# Patient Record
Sex: Male | Born: 1950
Health system: Southern US, Community
[De-identification: ages and names within clinical notes are randomized; demographics above are authoritative.]

## PROBLEM LIST (undated history)

## (undated) DIAGNOSIS — E119 Type 2 diabetes mellitus without complications: Secondary | ICD-10-CM

## (undated) DIAGNOSIS — R011 Cardiac murmur, unspecified: Secondary | ICD-10-CM

## (undated) DIAGNOSIS — R0789 Other chest pain: Secondary | ICD-10-CM

## (undated) DIAGNOSIS — E785 Hyperlipidemia, unspecified: Secondary | ICD-10-CM

## (undated) DIAGNOSIS — Z87442 Personal history of urinary calculi: Secondary | ICD-10-CM

## (undated) DIAGNOSIS — K5790 Diverticulosis of intestine, part unspecified, without perforation or abscess without bleeding: Secondary | ICD-10-CM

## (undated) HISTORY — DX: Hyperlipidemia, unspecified: E78.5

## (undated) HISTORY — DX: Type 2 diabetes mellitus without complications: E11.9

## (undated) HISTORY — DX: Cardiac murmur, unspecified: R01.1

## (undated) HISTORY — DX: Other chest pain: R07.89

## (undated) HISTORY — DX: Diverticulosis of intestine, part unspecified, without perforation or abscess without bleeding: K57.90

## (undated) HISTORY — PX: GANGLION CYST EXCISION: SHX1691

---
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
## 1987-12-29 DIAGNOSIS — R0789 Other chest pain: Secondary | ICD-10-CM

## 1987-12-29 HISTORY — DX: Other chest pain: R07.89

## 1999-08-11 ENCOUNTER — Emergency Department (HOSPITAL_COMMUNITY): Admission: EM | Admit: 1999-08-11 | Discharge: 1999-08-11 | Payer: Self-pay | Admitting: Internal Medicine

## 1999-08-11 ENCOUNTER — Encounter: Payer: Self-pay | Admitting: Internal Medicine

## 2006-05-19 ENCOUNTER — Ambulatory Visit: Payer: Self-pay | Admitting: Gastroenterology

## 2006-06-04 ENCOUNTER — Ambulatory Visit: Payer: Self-pay | Admitting: Gastroenterology

## 2008-11-27 ENCOUNTER — Ambulatory Visit: Payer: Self-pay | Admitting: Cardiology

## 2008-12-07 ENCOUNTER — Ambulatory Visit: Payer: Self-pay

## 2009-02-25 ENCOUNTER — Ambulatory Visit (HOSPITAL_COMMUNITY): Admission: RE | Admit: 2009-02-25 | Discharge: 2009-02-25 | Payer: Self-pay | Admitting: Family Medicine

## 2011-04-09 LAB — BUN: BUN: 9 mg/dL (ref 6–23)

## 2011-04-09 LAB — CREATININE, SERUM
Creatinine, Ser: 0.86 mg/dL (ref 0.4–1.5)
GFR calc Af Amer: >60 mL/min (ref >60)
GFR calc non Af Amer: >60 mL/min (ref >60)

## 2011-04-28 ENCOUNTER — Encounter: Payer: Self-pay | Admitting: Family Medicine

## 2011-05-12 NOTE — Assessment & Plan Note (Signed)
[facility]                            CARDIOLOGY OFFICE NOTE   COYLE, STORDAHL                       MRN:          [id number]  DATE:11/27/2008                            DOB:          05-13-1951    HISTORY OF PRESENT ILLNESS:  This is a 60 year old with a history of  hypercholesterolemia and type 2 diabetes, who presents to Cardiology  Clinic for evaluation of an equivocal exercise treadmill test and  atypical chest pain.  In general, the patient is quite active.  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  He does not get  shortness of breath with exertion and as I mentioned he does have a very  high level of exercise tolerance.  He states that occasionally he gets  some soreness in his chest when unloading heavy boxes out of trucks at  his store.  He thinks this maybe more musculoskeletal rather than  anything else.  However, given his age and the fact that he has diabetes  and high cholesterol, he has been somewhat worried about it.  He did  have an exercise treadmill test back in October 2009 at Dr. Kathi Der  office.  He did exercise for 9 minutes and 25 seconds reaching 10.5  METS.  There were nondiagnostic EKG changes.  However, he does have an  abnormal EKG at baseline with anteroseptal Q waves consistent with a  diagnosis of an old anteroseptal MI.  Again, the patient's chest  soreness does tend to be quite rare and only occurs with lifting heavy  objects.   PAST MEDICAL HISTORY:  1. Type 2 diabetes.  The patient has been on Diabeta for 2-1/2 years.  2. Hypercholesterolemia.  3. History of supraventricular tachycardia back in 1989.  The patient      is not sure what the final diagnosis was at this time.  He was seen      in [facility], however, I do not have records of this.   MEDICATIONS:  1. Januvia 100 mg daily.  2. Fish oil.  3.  Lipitor 40 mg daily.  4. Lisinopril 5 mg daily.  5. Aspirin 81 mg daily.   SOCIAL HISTORY:  The patient is a nonsmoker.  He does not drink alcohol.  He is married with children.  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   FAMILY HISTORY:  There is no apparent family history of premature  coronary artery disease.  The patient's mother did have type 2 diabetes.   REVIEW OF SYSTEMS:  Negative except as noted in history of present  illness.   LABORATORY DATA:  Most recent labs from November 2009, LDL is 56, HDL is  38, LDL particle number is 865, hs-CRP is 0.6 which is low, and  hemoglobin A1c is 6.4.  EKG today was reviewed showed normal sinus  rhythm with an incomplete right bundle-branch  block and evidence for an  old anterior septal MI.   PHYSICAL EXAMINATION:  VITAL SIGNS:  Blood pressure 123/71, heart rate  67 and regular, and weight is 176 pounds.  GENERAL:  This is a well-developed male in no apparent distress.  NEUROLOGIC:  Alert and oriented x3.  Normal affect.  NECK:  No JVD.  No thyromegaly or thyroid nodule.  HEENT:  Normal exam.  CARDIOVASCULAR:  Heart regular, S1 and S2.  There is normal  physiological splitting of S2.  No S3.  No S4.  No murmur.  There is no  carotid bruit.  EXTREMITIES:  There is no peripheral edema.  There are 2+ posterior  tibial pulses bilaterally.  ABDOMEN:  Soft and nontender.  No hepatosplenomegaly.  LUNGS:  Clear to auscultation bilaterally with normal respiratory  effort.  MUSCULOSKELETAL:  Normal exam.  SKIN:  Normal exam.   ASSESSMENT AND PLAN:  This is a 60 year old with history of type 2  diabetes and hypercholesterolemia, who presents for evaluation of chest  pain and an equivocal exercise treadmill test.  1. Chest pain.  The patient does have some soreness in his chest with      heavy lifting.  This happens rarely and at baseline, he is quite      active and does not get any chest  pain with any other activity.      This maybe due to musculoskeletal pain.  However, the patient did      have a nondiagnostic exercise treadmill test in October and he does      have questionable evidence for old anterior septal myocardial      infarction on his EKG.  Therefore, I do think it will be reasonable      to further pursue this, especially given his risk factors of      diabetes and hypercholesterolemia, with an exercise treadmill      Myoview.  We will go ahead and set this up for the next week.  He      should continue on his aspirin and his ACE inhibitor and statin as      he is doing.  2. Hyperlipidemia.  The patient's cholesterol is under excellent      control with a low LDL particle number and a low hs-CRP.  3. Type 2 diabetes.  The patient's diabetes appears to be under good      control with a hemoglobin A1c of 6.4.     Marca Ancona, MD  Electronically Signed    DM/MedQ  DD: 11/27/2008  DT: 11/28/2008  Job #: [id number]   cc:   Ernestina Penna, M.D.

## 2011-11-10 ENCOUNTER — Ambulatory Visit: Payer: Self-pay | Admitting: Physical Therapy

## 2011-12-15 ENCOUNTER — Ambulatory Visit: Payer: Self-pay | Admitting: Physical Therapy

## 2011-12-17 ENCOUNTER — Encounter: Payer: Self-pay | Admitting: Physical Therapy

## 2013-04-04 ENCOUNTER — Other Ambulatory Visit: Payer: Self-pay | Admitting: *Deleted

## 2013-04-04 MED ORDER — LISINOPRIL 5 MG PO TABS: 5.0000 mg | ORAL_TABLET | Freq: Every day | ORAL | Status: DC

## 2013-06-08 ENCOUNTER — Encounter: Payer: Self-pay | Admitting: *Deleted

## 2013-07-05 ENCOUNTER — Ambulatory Visit: Payer: Self-pay | Admitting: Family Medicine

## 2013-07-06 ENCOUNTER — Other Ambulatory Visit (INDEPENDENT_AMBULATORY_CARE_PROVIDER_SITE_OTHER): Payer: BC Managed Care – PPO

## 2013-07-06 ENCOUNTER — Other Ambulatory Visit: Payer: BC Managed Care – PPO

## 2013-07-06 DIAGNOSIS — I10 Essential (primary) hypertension: Secondary | ICD-10-CM

## 2013-07-06 DIAGNOSIS — E785 Hyperlipidemia, unspecified: Secondary | ICD-10-CM

## 2013-07-06 DIAGNOSIS — E119 Type 2 diabetes mellitus without complications: Secondary | ICD-10-CM

## 2013-07-06 DIAGNOSIS — E559 Vitamin D deficiency, unspecified: Secondary | ICD-10-CM

## 2013-07-06 DIAGNOSIS — R5381 Other malaise: Secondary | ICD-10-CM

## 2013-07-06 LAB — BASIC METABOLIC PANEL WITH GFR
BUN: 20 mg/dL (ref 6–23)
GFR, Est African American: >89 mL/min
GFR, Est Non African American: >89 mL/min
Potassium: 4.8 mEq/L (ref 3.5–5.3)
Sodium: 138 mEq/L (ref 135–145)

## 2013-07-06 LAB — POCT CBC
Granulocyte percent: 69.3 %G (ref 37–80)
Lymph, poc: 2.2 (ref 0.6–3.4)
MPV: 7.6 fL (ref 0–99.8)
POC Granulocyte: 6 (ref 2–6.9)
POC LYMPH PERCENT: 25.6 %L (ref 10–50)
Platelet Count, POC: 228 10*3/uL (ref 142–424)
RBC: 5 M/uL (ref 4.69–6.13)
RDW, POC: 13 %
WBC: 8.6 10*3/uL (ref 4.6–10.2)

## 2013-07-06 LAB — HEPATIC FUNCTION PANEL
Bilirubin, Direct: 0.2 mg/dL (ref 0.0–0.3)
Indirect Bilirubin: 0.5 mg/dL (ref 0.0–0.9)
Total Bilirubin: 0.7 mg/dL (ref 0.3–1.2)
Total Protein: 6.4 g/dL (ref 6.0–8.3)

## 2013-07-06 NOTE — Progress Notes (Signed)
Patient came in for labs only.

## 2013-07-07 LAB — NMR LIPOPROFILE WITH LIPIDS
HDL Particle Number: 28.8 umol/L — ABNORMAL LOW (ref >=30.5)
HDL-C: 36 mg/dL — ABNORMAL LOW (ref >=40)
LDL (calc): 40 mg/dL (ref <100)
LDL Size: 20.2 nm — ABNORMAL LOW (ref >20.5)
LP-IR Score: 49 — ABNORMAL HIGH (ref <=45)
Triglycerides: 78 mg/dL (ref <150)

## 2013-07-12 ENCOUNTER — Other Ambulatory Visit: Payer: Self-pay | Admitting: Family Medicine

## 2013-07-12 ENCOUNTER — Ambulatory Visit (INDEPENDENT_AMBULATORY_CARE_PROVIDER_SITE_OTHER): Payer: BC Managed Care – PPO | Admitting: Family Medicine

## 2013-07-12 ENCOUNTER — Encounter: Payer: Self-pay | Admitting: Family Medicine

## 2013-07-12 VITALS — BP 115/69 | HR 65 | Temp 98.8°F | Ht 71.0 in | Wt 183.6 lb

## 2013-07-12 DIAGNOSIS — E785 Hyperlipidemia, unspecified: Secondary | ICD-10-CM

## 2013-07-12 DIAGNOSIS — E119 Type 2 diabetes mellitus without complications: Secondary | ICD-10-CM | POA: Insufficient documentation

## 2013-07-12 DIAGNOSIS — J309 Allergic rhinitis, unspecified: Secondary | ICD-10-CM

## 2013-07-12 DIAGNOSIS — E1169 Type 2 diabetes mellitus with other specified complication: Secondary | ICD-10-CM | POA: Insufficient documentation

## 2013-07-12 DIAGNOSIS — N400 Enlarged prostate without lower urinary tract symptoms: Secondary | ICD-10-CM | POA: Insufficient documentation

## 2013-07-12 DIAGNOSIS — K4090 Unilateral inguinal hernia, without obstruction or gangrene, not specified as recurrent: Secondary | ICD-10-CM

## 2013-07-12 NOTE — Patient Instructions (Addendum)
Fall precautions discussed Continue current meds and therapeutic lifestyle changes 

## 2013-07-12 NOTE — Progress Notes (Signed)
  Subjective:    Patient ID: Peter Mathis, male    DOB: 20-Nov-1951, 62 y.o.   MRN: [id number]  HPI Patient returns today for followup and management of chronic medical conditions. These include type 2 diabetes mellitus and hyperlipidemia. Patient had a recent positive FOBT.  Review of Systems  Constitutional: Positive for fatigue (slight).  HENT: Positive for postnasal drip (slight, due to allergies). Negative for ear pain and sore throat.   Eyes: Negative.   Respiratory: Negative.   Cardiovascular: Negative.   Gastrointestinal: Negative.   Endocrine: Negative.   Genitourinary: Negative.   Musculoskeletal: Negative.   Skin: Negative.   Allergic/Immunologic: Positive for environmental allergies (seasonal).  Neurological: Positive for headaches (with allergy symptoms).  Hematological: Negative.   Psychiatric/Behavioral: Negative.        Objective:   Physical Exam BP 115/69  Pulse 65  Temp(Src) 98.8 F (37.1 C) (Oral)  Ht 5\' 11"  (1.803 m)  Wt 183 lb 9.6 oz (83.28 kg)  BMI 25.62 kg/m2  The patient appeared well nourished and normally developed, alert and oriented to time and place. Speech, behavior and judgement appear normal. Vital signs as documented.  Head exam is unremarkable. No scleral icterus or pallor noted. There is nasal congestion bilaterally. Mouth and throat were normal Neck is without jugular venous distension, thyromegally, or carotid bruits. Carotid upstrokes are brisk bilaterally. No cervical adenopathy. Lungs are clear anteriorly and posteriorly to auscultation. Normal respiratory effort. Cardiac exam reveals regular rate and rhythm at 72 per minute. First and second heart sounds normal.  No murmurs, rubs or gallops.  Abdominal exam reveals normal bowl sounds, no bruit, no masses, no organomegaly and no aortic enlargement. No inguinal adenopathy. Rectal exam revealed no rectal masses. Prostate was slightly enlarged and smooth without lumps. External genitalia  were normal. There was a small palpable hernia on the left. Extremities are nonedematous and both femoral and pedal pulses are normal. Skin without pallor or jaundice.  Warm and dry, without rash. Neurologic exam reveals normal deep tendon reflexes and normal sensation. Diabetic foot exam was done          Assessment & Plan:  1. Hyperlipemia  2. Type II diabetes mellitus  3. BPH (benign prostatic hyperplasia) -PSA will be drawn today  4. Allergic rhinitis  5. Inguinal hernia  Patient Instructions  Fall precautions discussed Continue current meds and therapeutic lifestyle changes   Bring back FOBT  Nyra Capes MD

## 2013-07-25 ENCOUNTER — Other Ambulatory Visit: Payer: BC Managed Care – PPO

## 2013-07-25 DIAGNOSIS — Z1212 Encounter for screening for malignant neoplasm of rectum: Secondary | ICD-10-CM

## 2013-07-26 LAB — FECAL OCCULT BLOOD, IMMUNOCHEMICAL: Fecal Occult Blood: NEGATIVE

## 2013-07-29 ENCOUNTER — Encounter: Payer: Self-pay | Admitting: Family Medicine

## 2013-08-02 ENCOUNTER — Other Ambulatory Visit: Payer: Self-pay

## 2013-08-03 ENCOUNTER — Encounter: Payer: Self-pay | Admitting: *Deleted

## 2013-10-04 ENCOUNTER — Other Ambulatory Visit: Payer: Self-pay | Admitting: Family Medicine

## 2013-11-02 ENCOUNTER — Other Ambulatory Visit: Payer: Self-pay

## 2014-01-06 ENCOUNTER — Other Ambulatory Visit: Payer: Self-pay | Admitting: Family Medicine

## 2014-01-07 ENCOUNTER — Other Ambulatory Visit: Payer: Self-pay | Admitting: Family Medicine

## 2014-01-09 NOTE — Telephone Encounter (Signed)
Last seen 07/12/13  DWM  Patient wants a 61 day supply

## 2014-01-17 ENCOUNTER — Ambulatory Visit (INDEPENDENT_AMBULATORY_CARE_PROVIDER_SITE_OTHER): Payer: BC Managed Care – PPO

## 2014-01-17 ENCOUNTER — Other Ambulatory Visit: Payer: BC Managed Care – PPO

## 2014-01-17 ENCOUNTER — Encounter: Payer: Self-pay | Admitting: Family Medicine

## 2014-01-17 ENCOUNTER — Ambulatory Visit (INDEPENDENT_AMBULATORY_CARE_PROVIDER_SITE_OTHER): Payer: BC Managed Care – PPO | Admitting: Family Medicine

## 2014-01-17 VITALS — BP 134/79 | HR 69 | Temp 98.1°F | Ht 71.0 in | Wt 181.0 lb

## 2014-01-17 DIAGNOSIS — E1165 Type 2 diabetes mellitus with hyperglycemia: Principal | ICD-10-CM

## 2014-01-17 DIAGNOSIS — N400 Enlarged prostate without lower urinary tract symptoms: Secondary | ICD-10-CM

## 2014-01-17 DIAGNOSIS — R5381 Other malaise: Secondary | ICD-10-CM

## 2014-01-17 DIAGNOSIS — E119 Type 2 diabetes mellitus without complications: Secondary | ICD-10-CM

## 2014-01-17 DIAGNOSIS — E785 Hyperlipidemia, unspecified: Secondary | ICD-10-CM

## 2014-01-17 DIAGNOSIS — IMO0001 Reserved for inherently not codable concepts without codable children: Secondary | ICD-10-CM

## 2014-01-17 DIAGNOSIS — R5383 Other fatigue: Secondary | ICD-10-CM

## 2014-01-17 DIAGNOSIS — I4510 Unspecified right bundle-branch block: Secondary | ICD-10-CM

## 2014-01-17 DIAGNOSIS — Z0000 Encounter for general adult medical examination without abnormal findings: Secondary | ICD-10-CM

## 2014-01-17 DIAGNOSIS — J309 Allergic rhinitis, unspecified: Secondary | ICD-10-CM

## 2014-01-17 DIAGNOSIS — I10 Essential (primary) hypertension: Secondary | ICD-10-CM

## 2014-01-17 LAB — POCT CBC
Granulocyte percent: 70.8 %G (ref 37–80)
HEMATOCRIT: 46.2 % (ref 43.5–53.7)
Hemoglobin: 14.8 g/dL (ref 14.1–18.1)
Lymph, poc: 2.4 (ref 0.6–3.4)
MCH, POC: 28.4 pg (ref 27–31.2)
MCHC: 32 g/dL (ref 31.8–35.4)
MCV: 88.6 fL (ref 80–97)
MPV: 6.9 fL (ref 0–99.8)
POC Granulocyte: 6.4 (ref 2–6.9)
POC LYMPH PERCENT: 26.5 %L (ref 10–50)
Platelet Count, POC: 252 10*3/uL (ref 142–424)
RBC: 5.2 M/uL (ref 4.69–6.13)
RDW, POC: 12.6 %
WBC: 9 10*3/uL (ref 4.6–10.2)

## 2014-01-17 LAB — POCT URINALYSIS DIPSTICK
Bilirubin, UA: NEGATIVE
GLUCOSE UA: NEGATIVE
Leukocytes, UA: NEGATIVE
Nitrite, UA: NEGATIVE
Protein, UA: NEGATIVE
RBC UA: NEGATIVE
SPEC GRAV UA: 1.025
UROBILINOGEN UA: NEGATIVE
pH, UA: 6

## 2014-01-17 LAB — POCT UA - MICROSCOPIC ONLY
CASTS, UR, LPF, POC: NEGATIVE
Crystals, Ur, HPF, POC: NEGATIVE
MUCUS UA: NEGATIVE
Yeast, UA: NEGATIVE

## 2014-01-17 LAB — POCT GLYCOSYLATED HEMOGLOBIN (HGB A1C): Hemoglobin A1C: 7.2

## 2014-01-17 LAB — POCT UA - MICROALBUMIN: Microalbumin Ur, POC: NEGATIVE mg/L

## 2014-01-17 MED ORDER — AZELASTINE HCL 0.1 % NA SOLN: NASAL | Status: DC

## 2014-01-17 NOTE — Progress Notes (Signed)
Subjective:    Patient ID: Peter Mathis, male    DOB: [birth year], 63 y.o.   MRN: [id number]  HPI Pt here for follow up and management of chronic medical problems.        Patient Active Problem List   Diagnosis Date Noted  . Hyperlipemia 07/12/2013  . Type II diabetes mellitus 07/12/2013  . BPH (benign prostatic hyperplasia) 07/12/2013   Outpatient Encounter Prescriptions as of 01/17/2014  Medication Sig  . aspirin (ASPIRIN LOW DOSE) 81 MG EC tablet Take 81 mg by mouth daily.    [name] atorvastatin (LIPITOR) 40 MG tablet Take 40 mg by mouth daily.    . Cholecalciferol (VITAMIN D3) 5000 UNITS CAPS Take by mouth.    [name] JANUVIA 100 MG tablet TAKE 1 TABLET BY MOUTH DAILY  . lisinopril (PRINIVIL,ZESTRIL) 5 MG tablet TAKE 1 TABLET (5 MG TOTAL) BY MOUTH DAILY.  . niacin (NIASPAN) 1000 MG CR tablet TAKE 1 TABLET BY MOUTH EVERY DAY  . omega-3 acid ethyl esters (LOVAZA) 1 G capsule Take 2 g by mouth daily.  . [DISCONTINUED] niacin (NIASPAN) 500 MG CR tablet Take 500 mg by mouth daily.      Review of Systems  Constitutional: Negative.   HENT: Positive for congestion (head congestions) and postnasal drip.   Eyes: Negative.   Respiratory: Negative.   Cardiovascular: Negative.   Gastrointestinal: Negative.   Endocrine: Negative.   Genitourinary: Negative.   Musculoskeletal: Negative.   Skin: Negative.   Allergic/Immunologic: Negative.   Neurological: Negative.   Hematological: Negative.   Psychiatric/Behavioral: Negative.        Objective:   Physical Exam  Nursing note and vitals reviewed. Constitutional: He is oriented to person, place, and time. He appears well-developed and well-nourished. No distress.  HENT:  Head: Normocephalic and atraumatic.  Right Ear: External ear normal.  Left Ear: External ear normal.  Mouth/Throat: Oropharynx is clear and moist. No oropharyngeal exudate.  Nasal congestion and turbinate swelling bilaterally  Eyes: Conjunctivae and EOM are normal.  Pupils are equal, round, and reactive to light. Right eye exhibits no discharge. Left eye exhibits no discharge. No scleral icterus.  Neck: Normal range of motion. Neck supple. No tracheal deviation present. No thyromegaly present.  No carotid bruits  Cardiovascular: Normal rate, regular rhythm, normal heart sounds and intact distal pulses.  Exam reveals no gallop and no friction rub.   No murmur heard. At 72 per minute  Pulmonary/Chest: Effort normal and breath sounds normal. No respiratory distress. He has no wheezes. He has no rales. He exhibits no tenderness.  Abdominal: Soft. Bowel sounds are normal. He exhibits no mass. There is no tenderness. There is no rebound and no guarding.  Musculoskeletal: Normal range of motion. He exhibits no edema and no tenderness.  Lymphadenopathy:    He has no cervical adenopathy.  Neurological: He is alert and oriented to person, place, and time. He has normal reflexes. No cranial nerve deficit.  Skin: Skin is warm and dry. No rash noted. No erythema. No pallor.  Psychiatric: He has a normal mood and affect. His behavior is normal. Judgment and thought content normal.   BP 134/79  Pulse 69  Temp(Src) 98.1 F (36.7 C) (Oral)  Ht 5\' 11"  (1.803 m)  Wt 181 lb (82.101 kg)  BMI 25.26 kg/m2  [facility] reading (PRIMARY) by  DrMoore;-chest x-ray;   --no active disease  Assessment & Plan:  1. BPH (benign prostatic hyperplasia)  2. Hyperlipemia - DG Chest 2 View; Future  3. Type II diabetes mellitus - DG Chest 2 View; Future  4. Health care maintenance - DG Chest 2 View; Future  5. Right bundle branch block  6. Allergic rhinitis - azelastine (ASTELIN) 137 MCG/SPRAY nasal spray; 2 sprays each nostril at bedtime  Dispense: 30 mL; Refill: 12  Patient Instructions  Continue current medications. Continue good therapeutic lifestyle changes which include good diet and exercise. Fall precautions discussed with  patient. Schedule your flu vaccine if you haven't had it yet If you are over 66 years old - you may need Prevnar 80 or the adult Pneumonia vaccine. Use a cool mist humidifier in her bedroom at nighttime Use the additional nasal spray as directed Use saline nose spray over-the-counter throughout the day Keep yourself well hydrated Check with your insurance regarding the Prevnar vaccine    Arrie Senate MD

## 2014-01-17 NOTE — Patient Instructions (Addendum)
Continue current medications. Continue good therapeutic lifestyle changes which include good diet and exercise. Fall precautions discussed with patient. Schedule your flu vaccine if you haven't had it yet If you are over 64 years old - you may need Prevnar 33 or the adult Pneumonia vaccine. Use a cool mist humidifier in her bedroom at nighttime Use the additional nasal spray as directed Use saline nose spray over-the-counter throughout the day Keep yourself well hydrated Check with your insurance regarding the Prevnar vaccine

## 2014-01-18 LAB — BMP8+EGFR
BUN/Creatinine Ratio: 22 (ref 10–22)
BUN: 19 mg/dL (ref 8–27)
CO2: 25 mmol/L (ref 18–29)
Calcium: 9 mg/dL (ref 8.6–10.2)
Chloride: 99 mmol/L (ref 97–108)
Creatinine, Ser: 0.86 mg/dL (ref 0.76–1.27)
GFR, EST AFRICAN AMERICAN: 107 mL/min/{1.73_m2} (ref >59)
GFR, EST NON AFRICAN AMERICAN: 92 mL/min/{1.73_m2} (ref >59)
Glucose: 164 mg/dL — ABNORMAL HIGH (ref 65–99)
Potassium: 4.6 mmol/L (ref 3.5–5.2)
Sodium: 139 mmol/L (ref 134–144)

## 2014-01-18 LAB — NMR, LIPOPROFILE
CHOLESTEROL: 89 mg/dL (ref <200)
HDL Cholesterol by NMR: 36 mg/dL — ABNORMAL LOW (ref >=40)
HDL PARTICLE NUMBER: 28.2 umol/L — AB (ref >=30.5)
LDL Particle Number: 678 nmol/L (ref <1000)
LDL Size: 19.9 nm — ABNORMAL LOW (ref >20.5)
LDLC SERPL CALC-MCNC: 40 mg/dL (ref <100)
LP-IR Score: 45 (ref <=45)
SMALL LDL PARTICLE NUMBER: 460 nmol/L (ref <=527)
TRIGLYCERIDES BY NMR: 67 mg/dL (ref <150)

## 2014-01-18 LAB — HEPATIC FUNCTION PANEL
ALT: 24 IU/L (ref 0–44)
AST: 18 IU/L (ref 0–40)
Albumin: 4.1 g/dL (ref 3.6–4.8)
Alkaline Phosphatase: 62 IU/L (ref 39–117)
BILIRUBIN DIRECT: 0.3 mg/dL (ref 0.00–0.40)
TOTAL PROTEIN: 6.8 g/dL (ref 6.0–8.5)
Total Bilirubin: 1.1 mg/dL (ref 0.0–1.2)

## 2014-01-18 LAB — VITAMIN D 25 HYDROXY (VIT D DEFICIENCY, FRACTURES): Vit D, 25-Hydroxy: 35.7 ng/mL (ref 30.0–100.0)

## 2014-01-23 ENCOUNTER — Telehealth: Payer: Self-pay | Admitting: *Deleted

## 2014-01-23 NOTE — Telephone Encounter (Signed)
Done

## 2014-02-17 ENCOUNTER — Other Ambulatory Visit: Payer: Self-pay | Admitting: Family Medicine

## 2014-02-22 NOTE — Telephone Encounter (Signed)
Per previous labs pt said he is already taking vit d 5000u daily? Do you still want him to increase this? See last labs.

## 2014-02-24 NOTE — Telephone Encounter (Signed)
Have patient take 5000 daily except 10,000 on Sunday of vitamin D.

## 2014-02-25 ENCOUNTER — Other Ambulatory Visit: Payer: Self-pay | Admitting: Family Medicine

## 2014-03-05 ENCOUNTER — Other Ambulatory Visit: Payer: Self-pay | Admitting: Family Medicine

## 2014-03-06 ENCOUNTER — Telehealth: Payer: Self-pay | Admitting: Family Medicine

## 2014-03-07 MED ORDER — GLUCOSE BLOOD VI STRP: ORAL_STRIP | Status: DC

## 2014-03-07 NOTE — Telephone Encounter (Signed)
done

## 2014-03-23 ENCOUNTER — Other Ambulatory Visit: Payer: Self-pay | Admitting: Family Medicine

## 2014-04-07 ENCOUNTER — Other Ambulatory Visit: Payer: Self-pay | Admitting: Family Medicine

## 2014-06-09 ENCOUNTER — Other Ambulatory Visit: Payer: Self-pay | Admitting: Family Medicine

## 2014-06-11 NOTE — Telephone Encounter (Signed)
Last lipomed 1/15

## 2014-06-12 ENCOUNTER — Other Ambulatory Visit: Payer: Self-pay | Admitting: Family Medicine

## 2014-07-09 ENCOUNTER — Ambulatory Visit (INDEPENDENT_AMBULATORY_CARE_PROVIDER_SITE_OTHER): Payer: BC Managed Care – PPO | Admitting: Nurse Practitioner

## 2014-07-09 VITALS — BP 120/70 | HR 71 | Temp 98.1°F | Ht 71.0 in | Wt 180.4 lb

## 2014-07-09 DIAGNOSIS — S30860A Insect bite (nonvenomous) of lower back and pelvis, initial encounter: Secondary | ICD-10-CM

## 2014-07-09 DIAGNOSIS — S30861A Insect bite (nonvenomous) of abdominal wall, initial encounter: Secondary | ICD-10-CM

## 2014-07-09 DIAGNOSIS — W57XXXA Bitten or stung by nonvenomous insect and other nonvenomous arthropods, initial encounter: Principal | ICD-10-CM

## 2014-07-09 MED ORDER — DOXYCYCLINE HYCLATE 100 MG PO TABS: 100.0000 mg | ORAL_TABLET | Freq: Two times a day (BID) | ORAL | Status: DC

## 2014-07-09 NOTE — Progress Notes (Signed)
   Subjective:    Patient ID: Peter Mathis, male    DOB: 05/14/1951, 63 y.o.   MRN: [id number]  HPI Patient in c/o neck stiffness- slight pain- radiating down back- sometimes his anterior shins will hurt- He said this started over  2 months ago- Michela Pitcher that he had a tick removed from his groin area mid April and he wonders if he has lymes disease.     Review of Systems  Constitutional: Positive for fatigue (big change for him). Negative for fever, chills and unexpected weight change.  HENT: Negative.   Respiratory: Negative.   Cardiovascular: Negative.   Genitourinary: Negative.   Neurological: Negative.   Psychiatric/Behavioral: Negative.   All other systems reviewed and are negative.      Objective:   Physical Exam  Constitutional: He is oriented to person, place, and time. He appears well-developed and well-nourished.  HENT:  Head: Normocephalic.  Right Ear: External ear normal.  Left Ear: External ear normal.  Nose: Nose normal.  Mouth/Throat: Oropharynx is clear and moist.  Eyes: Pupils are equal, round, and reactive to light.  Neck: Normal range of motion. Neck supple.  Cardiovascular: Normal rate, regular rhythm and normal heart sounds.   Pulmonary/Chest: Effort normal and breath sounds normal.  Abdominal: Soft. Bowel sounds are normal.  Musculoskeletal: Normal range of motion. He exhibits tenderness (along cervical spine on palpation.).  Lymphadenopathy:    He has no cervical adenopathy.  Neurological: He is alert and oriented to person, place, and time. He has normal reflexes. No cranial nerve deficit.  Skin: Skin is warm and dry.  Psychiatric: He has a normal mood and affect. His behavior is normal. Judgment and thought content normal.   BP 120/70  Pulse 71  Temp(Src) 98.1 F (36.7 C) (Oral)  Ht 5\' 11"  (1.803 m)  Wt 180 lb 6.4 oz (81.829 kg)  BMI 25.17 kg/m2        Assessment & Plan:  Tick bite of groin, initial encounter - Plan: Lyme Ab/Western Blot  Reflex, [place] spotted fvr abs pnl(IgG+IgM)  Meds ordered this encounter  Medications  . doxycycline (VIBRA-TABS) 100 MG tablet    Sig: Take 1 tablet (100 mg total) by mouth 2 (two) times daily.    Dispense:  28 tablet    Refill:  0    Order Specific Question:  Supervising Provider    Answer:  Chipper Herb [[id number]]   [facility] fluids Labs pending  Mary-Margaret Hassell Done, FNP

## 2014-07-09 NOTE — Patient Instructions (Signed)
Lyme Disease You may have been bitten by a tick and are to watch for the development of Lyme Disease. Lyme Disease is an infection that is caused by a bacteria The bacteria causing this disease is named Borreilia burgdorferi. If a tick is infected with this bacteria and then bites you, then Lyme Disease may occur. These ticks are carried by deer and rodents such as rabbits and mice and infest grassy as well as forested areas. Fortunately most tick bites do not cause Lyme Disease.  Lyme Disease is easier to prevent than to treat. First, covering your legs with clothing when walking in areas where ticks are possibly abundant will prevent their attachment because ticks tend to stay within inches of the ground. Second, using insecticides containing DEET can be applied on skin or clothing. Last, because it takes about 12 to 24 hours for the tick to transmit the disease after attachment to the human host, you should inspect your body for ticks twice a day when you are in areas where Lyme Disease is common. You must look thoroughly when searching for ticks. The Ixodes tick that carries Lyme Disease is very small. It is around the size of a sesame seed (picture of tick is not actual size). Removal is best done by grasping the tick by the head and pulling it out. Do not to squeeze the body of the tick. This could inject the infecting bacteria into the bite site. Wash the area of the bite with an antiseptic solution after removal.  Lyme Disease is a disease that may affect many body systems. Because of the small size of the biting tick, most people do not notice being bitten. The first sign of an infection is usually a round red rash that extends out from the center of the tick bite. The center of the lesion may be blood colored (hemorrhagic) or have tiny blisters (vesicular). Most lesions have bright red outer borders and partial central clearing. This rash may extend out many inches in diameter, and multiple lesions may  be present. Other symptoms such as fatigue, headaches, chills and fever, general achiness and swelling of lymph glands may also occur. If this first stage of the disease is left untreated, these symptoms may gradually resolve by themselves, or progressive symptoms may occur because of spread of infection to other areas of the body.  Follow up with your caregiver to have testing and treatment if you have a tick bite and you develop any of the above complaints. Your caregiver may recommend preventative (prophylactic) medications which kill bacteria (antibiotics). Once a diagnosis of Lyme Disease is made, antibiotic treatment is highly likely to cure the disease. Effective treatment of late stage Lyme Disease may require longer courses of antibiotic therapy.  MAKE SURE YOU:   Understand these instructions.  Will watch your condition.  Will get help right away if you are not doing well or get worse. Document Released: 03/22/2001 Document Revised: 03/07/2012 Document Reviewed: 05/24/2009 Exit[employer] Patient Information 2015 [employer]. This information is not intended to replace advice given to you by your health care provider. Make sure you discuss any questions you have with your health care provider.  

## 2014-07-11 LAB — ROCKY MTN SPOTTED FVR ABS PNL(IGG+IGM)
RMSF IGM: 0.39 {index} (ref 0.00–0.89)
RMSF IgG: UNDETERMINED

## 2014-07-11 LAB — LYME AB/WESTERN BLOT REFLEX: LYME DISEASE AB, QUANT, IGM: <0.8 index (ref 0.00–0.79)

## 2014-07-11 LAB — RMSF, IGG, IFA: RMSF, IGG, IFA: 1:64 {titer} — ABNORMAL HIGH

## 2014-07-12 ENCOUNTER — Other Ambulatory Visit: Payer: Self-pay | Admitting: Family Medicine

## 2014-07-13 ENCOUNTER — Telehealth: Payer: Self-pay | Admitting: Family Medicine

## 2014-07-13 NOTE — Telephone Encounter (Signed)
Message copied by Waverly Ferrari on Fri Jul 13, 2014  9:20 AM ------      Message from: Chevis Pretty      Created: Fri Jul 13, 2014  9:08 AM       Positive for RMSF- doxy should take care of it- make sure you take for the entire 14 days      Lyme titer negative ------

## 2014-07-13 NOTE — Telephone Encounter (Signed)
Pt aware of lab results 

## 2014-07-17 ENCOUNTER — Encounter: Payer: Self-pay | Admitting: *Deleted

## 2014-07-19 ENCOUNTER — Encounter: Payer: Self-pay | Admitting: *Deleted

## 2014-07-24 ENCOUNTER — Telehealth: Payer: Self-pay | Admitting: Nurse Practitioner

## 2014-07-24 ENCOUNTER — Other Ambulatory Visit: Payer: Self-pay | Admitting: Nurse Practitioner

## 2014-07-25 ENCOUNTER — Telehealth: Payer: Self-pay | Admitting: Nurse Practitioner

## 2014-07-25 MED ORDER — DOXYCYCLINE HYCLATE 100 MG PO TABS: 100.0000 mg | ORAL_TABLET | Freq: Two times a day (BID) | ORAL | Status: DC

## 2014-07-25 NOTE — Telephone Encounter (Signed)
Already addressed

## 2014-07-25 NOTE — Telephone Encounter (Signed)
Patient seen you for RMSF--says he is feeling some better

## 2014-07-25 NOTE — Telephone Encounter (Signed)
Pt aware and verbalized understanding.  

## 2014-07-25 NOTE — Telephone Encounter (Signed)
Please advise 

## 2014-07-25 NOTE — Telephone Encounter (Signed)
Will do another round of doxy call back if no better

## 2014-07-31 ENCOUNTER — Encounter: Payer: Self-pay | Admitting: Family Medicine

## 2014-07-31 ENCOUNTER — Ambulatory Visit (INDEPENDENT_AMBULATORY_CARE_PROVIDER_SITE_OTHER): Payer: BC Managed Care – PPO | Admitting: Family Medicine

## 2014-07-31 VITALS — BP 136/78 | HR 88 | Temp 98.0°F | Ht 71.0 in | Wt 177.0 lb

## 2014-07-31 DIAGNOSIS — E785 Hyperlipidemia, unspecified: Secondary | ICD-10-CM

## 2014-07-31 DIAGNOSIS — Z0000 Encounter for general adult medical examination without abnormal findings: Secondary | ICD-10-CM

## 2014-07-31 DIAGNOSIS — I491 Atrial premature depolarization: Secondary | ICD-10-CM

## 2014-07-31 DIAGNOSIS — E559 Vitamin D deficiency, unspecified: Secondary | ICD-10-CM

## 2014-07-31 DIAGNOSIS — E119 Type 2 diabetes mellitus without complications: Secondary | ICD-10-CM

## 2014-07-31 DIAGNOSIS — N400 Enlarged prostate without lower urinary tract symptoms: Secondary | ICD-10-CM

## 2014-07-31 DIAGNOSIS — A770 Spotted fever due to Rickettsia rickettsii: Secondary | ICD-10-CM

## 2014-07-31 DIAGNOSIS — A779 Spotted fever, unspecified: Secondary | ICD-10-CM

## 2014-07-31 LAB — POCT CBC
GRANULOCYTE PERCENT: 72.5 % (ref 37–80)
HCT, POC: 46.2 % (ref 43.5–53.7)
HEMOGLOBIN: 15.2 g/dL (ref 14.1–18.1)
Lymph, poc: 2.6 (ref 0.6–3.4)
MCH, POC: 29.1 pg (ref 27–31.2)
MCHC: 33 g/dL (ref 31.8–35.4)
MCV: 88.3 fL (ref 80–97)
MPV: 7.2 fL (ref 0–99.8)
PLATELET COUNT, POC: 226 10*3/uL (ref 142–424)
POC GRANULOCYTE: 7.2 — AB (ref 2–6.9)
POC LYMPH %: 26.4 % (ref 10–50)
RBC: 5.2 M/uL (ref 4.69–6.13)
RDW, POC: 12.7 %
WBC: 9.9 10*3/uL (ref 4.6–10.2)

## 2014-07-31 LAB — POCT UA - MICROSCOPIC ONLY
CASTS, UR, LPF, POC: NEGATIVE
Crystals, Ur, HPF, POC: NEGATIVE
YEAST UA: NEGATIVE

## 2014-07-31 LAB — POCT URINALYSIS DIPSTICK
Bilirubin, UA: NEGATIVE
GLUCOSE UA: NEGATIVE
Ketones, UA: NEGATIVE
NITRITE UA: NEGATIVE
Spec Grav, UA: 1.025
UROBILINOGEN UA: NEGATIVE
pH, UA: 6

## 2014-07-31 LAB — POCT UA - MICROALBUMIN: MICROALBUMIN (UR) POC: 50 mg/L

## 2014-07-31 LAB — POCT GLYCOSYLATED HEMOGLOBIN (HGB A1C): HEMOGLOBIN A1C: 7.5

## 2014-07-31 NOTE — Patient Instructions (Addendum)
Continue current medications. Continue good therapeutic lifestyle changes which include good diet and exercise. Fall precautions discussed with patient. If an FOBT was given today- please return it to our front desk. If you are over 64 years old - you may need Prevnar 80 or the adult Pneumonia vaccine.  Continue to take 2 additional weeks of antibiotics Return the FOBT Continue to monitor blood pressures and blood sugars at time We will call you with the results of the lab work is and is as results are available Please check with your insurance regarding the Prevnar vaccine Watch sodium intake Continue with fluticasone and Astelin nose sprays Avoid over-the-counter antihistamines with pseudoephedrine

## 2014-07-31 NOTE — Progress Notes (Signed)
 Subjective:    Patient ID: Peter Mathis, male    DOB: 11/22/1951, 63 y.o.   MRN: [id number]  HPI Patient is here today for annual wellness exam and follow up of chronic medical problems. The patient recently had a tick bite and this was found to be positive for [place] fever for which he is taking doxycycline. The tic bite was actually in April. He did not start the treatment and to a couple weeks ago. He is feeling better from this. He is seen today to return and FOBT and get lab work. He is also due for a prostate and PSA. Blood pressures have been running in the 120s over the 70s.        Patient Active Problem List   Diagnosis Date Noted  . Right bundle branch block, history of 01/17/2014  . Hyperlipemia 07/12/2013  . Type II diabetes mellitus 07/12/2013  . BPH (benign prostatic hyperplasia) 07/12/2013   Outpatient Encounter Prescriptions as of 07/31/2014  Medication Sig  . aspirin (ASPIRIN LOW DOSE) 81 MG EC tablet Take 81 mg by mouth daily.    . atorvastatin (LIPITOR) 80 MG tablet TAKE 1 TABLET AT BEDTIME  . azelastine (ASTELIN) 137 MCG/SPRAY nasal spray 2 sprays each nostril at bedtime  . Cholecalciferol (VITAMIN D3) 5000 UNITS CAPS Take by mouth.    . doxycycline (VIBRA-TABS) 100 MG tablet Take 1 tablet (100 mg total) by mouth 2 (two) times daily.  . glucose blood (ONE TOUCH ULTRA TEST) test strip Test bid, Dx 250.00  . JANUVIA 100 MG tablet TAKE 1 TABLET BY MOUTH DAILY  . lisinopril (PRINIVIL,ZESTRIL) 5 MG tablet TAKE 1 TABLET (5 MG TOTAL) BY MOUTH DAILY.  . niacin (NIASPAN) 1000 MG CR tablet TAKE 1 TABLET BY MOUTH EVERY DAY  . omega-3 acid ethyl esters (LOVAZA) 1 G capsule TAKE 2 CAPSULES TWICE DAILY    Review of Systems  Constitutional: Negative.   HENT: Negative.   Eyes: Negative.   Respiratory: Negative.   Cardiovascular: Negative.   Gastrointestinal: Negative.   Endocrine: Negative.   Genitourinary: Negative.   Musculoskeletal: Positive for  arthralgias, myalgias (positive RMSF) and neck pain.  Skin: Negative.   Allergic/Immunologic: Negative.   Neurological: Negative.   Hematological: Negative.   Psychiatric/Behavioral: Negative.        Objective:   Physical Exam  Nursing note and vitals reviewed. Constitutional: He is oriented to person, place, and time. He appears well-developed and well-nourished. No distress.  HENT:  Head: Normocephalic and atraumatic.  Right Ear: External ear normal.  Left Ear: External ear normal.  Mouth/Throat: Oropharynx is clear and moist. No oropharyngeal exudate.  Nasal congestion bilateral  Eyes: Conjunctivae and EOM are normal. Pupils are equal, round, and reactive to light. Right eye exhibits no discharge. Left eye exhibits no discharge. No scleral icterus.  Neck: Normal range of motion. Neck supple. No thyromegaly present.  Cardiovascular: Normal rate, normal heart sounds and intact distal pulses.  Exam reveals no gallop and no friction rub.   No murmur heard. The heart was slightly irregular at 72 per minute  Pulmonary/Chest: Effort normal and breath sounds normal. No respiratory distress. He has no wheezes. He has no rales. He exhibits no tenderness.  Abdominal: Soft. Bowel sounds are normal. He exhibits no mass. There is no tenderness. There is no rebound and no guarding.  Genitourinary: Rectum normal and penis normal. No penile tenderness.  Minimal prostate enlargement. There were no inguinal nodes or inguinal hernias. External genitalia   were normal. Rectal exam was without masses. Prostate had no lumps. The external genitalia were within normal limits   Musculoskeletal: Normal range of motion. He exhibits no edema and no tenderness.  Lymphadenopathy:    He has no cervical adenopathy.  Neurological: He is alert and oriented to person, place, and time. He has normal reflexes. No cranial nerve deficit.  Skin: Skin is warm and dry. No rash noted. No erythema. No pallor.  Psychiatric: He  has a normal mood and affect. His behavior is normal. Judgment and thought content normal.   BP 143/82  Pulse 88  Temp(Src) 98 F (36.7 C) (Oral)  Ht 5' 11" (1.803 m)  Wt 177 lb (80.287 kg)  BMI 24.70 kg/m2 Repeat blood pressure 136/78 EKG: -PAC's       Assessment & Plan:  1. Hyperlipemia - POCT CBC - NMR, lipoprofile - BMP8+EGFR - Hepatic function panel  2. Type 2 diabetes mellitus without complication - POCT CBC - POCT glycosylated hemoglobin (Hb A1C) - POCT UA - Microalbumin - BMP8+EGFR -Plan ETT in October  3. BPH (benign prostatic hyperplasia) - POCT CBC - POCT UA - Microscopic Only - POCT urinalysis dipstick - PSA, total and free  4. Vitamin D deficiency - Vit D  25 hydroxy (rtn osteoporosis monitoring)  5. Annual physical exam - POCT CBC - POCT glycosylated hemoglobin (Hb A1C) - POCT UA - Microalbumin - POCT UA - Microscopic Only - POCT urinalysis dipstick - NMR, lipoprofile - PSA, total and free - BMP8+EGFR - Hepatic function panel - Vit D  25 hydroxy (rtn osteoporosis monitoring) - Thyroid Panel With TSH  6. [place] spotted fever -Continue current antibiotic  Patient Instructions  Continue current medications. Continue good therapeutic lifestyle changes which include good diet and exercise. Fall precautions discussed with patient. If an FOBT was given today- please return it to our front desk. If you are over 50 years old - you may need Prevnar 13 or the adult Pneumonia vaccine.  Continue to take 2 additional weeks of antibiotics Return the FOBT Continue to monitor blood pressures and blood sugars at time We will call you with the results of the lab work is and is as results are available Please check with your insurance regarding the Prevnar vaccine Watch sodium intake Continue with fluticasone and Astelin nose sprays Avoid over-the-counter antihistamines with pseudoephedrine   Don W. Moore MD   

## 2014-08-01 ENCOUNTER — Ambulatory Visit: Payer: BC Managed Care – PPO | Admitting: Family Medicine

## 2014-08-01 LAB — BMP8+EGFR
BUN/Creatinine Ratio: 16 (ref 10–22)
BUN: 17 mg/dL (ref 8–27)
CALCIUM: 9.9 mg/dL (ref 8.6–10.2)
CHLORIDE: 99 mmol/L (ref 97–108)
CO2: 24 mmol/L (ref 18–29)
Creatinine, Ser: 1.09 mg/dL (ref 0.76–1.27)
GFR calc Af Amer: 82 mL/min/{1.73_m2} (ref >59)
GFR, EST NON AFRICAN AMERICAN: 71 mL/min/{1.73_m2} (ref >59)
Glucose: 136 mg/dL — ABNORMAL HIGH (ref 65–99)
POTASSIUM: 5.1 mmol/L (ref 3.5–5.2)
Sodium: 137 mmol/L (ref 134–144)

## 2014-08-01 LAB — NMR, LIPOPROFILE
CHOLESTEROL: 104 mg/dL (ref 100–199)
HDL Cholesterol by NMR: 46 mg/dL (ref >39)
HDL PARTICLE NUMBER: 33.6 umol/L (ref >=30.5)
LDL PARTICLE NUMBER: 410 nmol/L (ref <1000)
LDL SIZE: 20.1 nm (ref >20.5)
LDLC SERPL CALC-MCNC: 42 mg/dL (ref 0–99)
LP-IR SCORE: 40 (ref <=45)
Small LDL Particle Number: 298 nmol/L (ref <=527)
Triglycerides by NMR: 80 mg/dL (ref 0–149)

## 2014-08-01 LAB — HEPATIC FUNCTION PANEL
ALK PHOS: 54 IU/L (ref 39–117)
ALT: 31 IU/L (ref 0–44)
AST: 24 IU/L (ref 0–40)
Albumin: 4.6 g/dL (ref 3.6–4.8)
Bilirubin, Direct: 0.21 mg/dL (ref 0.00–0.40)
Total Bilirubin: 0.8 mg/dL (ref 0.0–1.2)
Total Protein: 7.3 g/dL (ref 6.0–8.5)

## 2014-08-01 LAB — VITAMIN D 25 HYDROXY (VIT D DEFICIENCY, FRACTURES): VIT D 25 HYDROXY: 46.9 ng/mL (ref 30.0–100.0)

## 2014-08-01 LAB — THYROID PANEL WITH TSH
Free Thyroxine Index: 1.5 (ref 1.2–4.9)
T3 UPTAKE RATIO: 25 % (ref 24–39)
T4, Total: 6 ug/dL (ref 4.5–12.0)
TSH: 1.86 u[IU]/mL (ref 0.450–4.500)

## 2014-08-01 LAB — SEDIMENTATION RATE: Sed Rate: 2 mm/hr (ref 0–30)

## 2014-08-01 LAB — PSA, TOTAL AND FREE
PSA FREE PCT: 50 %
PSA, Free: 0.55 ng/mL
PSA: 1.1 ng/mL (ref 0.0–4.0)

## 2014-08-24 ENCOUNTER — Other Ambulatory Visit: Payer: Self-pay | Admitting: Family Medicine

## 2014-10-10 ENCOUNTER — Encounter: Payer: Self-pay | Admitting: *Deleted

## 2014-10-10 ENCOUNTER — Encounter: Payer: Self-pay | Admitting: Cardiology

## 2014-10-10 ENCOUNTER — Ambulatory Visit (INDEPENDENT_AMBULATORY_CARE_PROVIDER_SITE_OTHER): Payer: BC Managed Care – PPO | Admitting: Cardiology

## 2014-10-10 VITALS — BP 124/68 | HR 69 | Ht 71.0 in | Wt 179.0 lb

## 2014-10-10 DIAGNOSIS — I4510 Unspecified right bundle-branch block: Secondary | ICD-10-CM

## 2014-10-10 DIAGNOSIS — Z87898 Personal history of other specified conditions: Secondary | ICD-10-CM

## 2014-10-10 DIAGNOSIS — E785 Hyperlipidemia, unspecified: Secondary | ICD-10-CM

## 2014-10-10 DIAGNOSIS — Z9189 Other specified personal risk factors, not elsewhere classified: Secondary | ICD-10-CM

## 2014-10-10 DIAGNOSIS — R011 Cardiac murmur, unspecified: Secondary | ICD-10-CM

## 2014-10-10 NOTE — Patient Instructions (Signed)
Your physician has requested that you have an echocardiogram. Echocardiography is a painless test that uses sound waves to create images of your heart. It provides your doctor with information about the size and shape of your heart and how well your heart's chambers and valves are working. This procedure takes approximately one hour. There are no restrictions for this procedure.  Your physician wants you to follow-up in: 2 years with Dr Aundra Dubin. (October 2017). You will receive a reminder letter in the mail two months in advance. If you don't receive a letter, please call our office to schedule the follow-up appointment.

## 2014-10-11 DIAGNOSIS — Z9189 Other specified personal risk factors, not elsewhere classified: Secondary | ICD-10-CM | POA: Insufficient documentation

## 2014-10-11 NOTE — Progress Notes (Signed)
Patient ID: Peter Mathis, male   DOB: Apr 20, 1951, 63 y.o.   MRN: [id number] PCP: Sallyanne Havers  63 yo with history of type II diabetes and hyperlipidemia presents for cardiology evaluation.  He was found on recent ECG (8/15) to have RBBB with LAFB.  No prior ECG in system.  He is doing quite well symptomatically.  He has not exertional chest pain or dyspnea.  He can jog without much difficulty.  No lightheadedness or palpitations.  No cardiac history.  No lightheadedness or syncope.   ECG: NSR, LAFB, RBBB  Labs (8/15): LDL 42, LDL particle number 410  PMH: 1. [place] Spotted Fever: 8/15 diagnosis.  2. H/o RBBB 3. Type II diabetes 4. Hyperlipidemia 5. 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 Nonsmoker.  Married with children.   FH: Father with CAD, mother with PAD and atrial fibrillation.   ROS: All systems reviewed and negative except as per HPI.   Current Outpatient Prescriptions  Medication Sig Dispense Refill  . aspirin (ASPIRIN LOW DOSE) 81 MG EC tablet Take 81 mg by mouth daily.        [name] atorvastatin (LIPITOR) 80 MG tablet TAKE 1 TABLET AT BEDTIME  90 tablet  1  . azelastine (ASTELIN) 137 MCG/SPRAY nasal spray 2 sprays each nostril at bedtime  30 mL  12  . Cholecalciferol (VITAMIN D3) 5000 UNITS CAPS Take by mouth.        [name] glucose blood (ONE TOUCH ULTRA TEST) test strip Test bid, Dx 250.00  100 each  2  . JANUVIA 100 MG tablet TAKE 1 TABLET BY MOUTH DAILY  30 tablet  2  . lisinopril (PRINIVIL,ZESTRIL) 5 MG tablet TAKE 1 TABLET (5 MG TOTAL) BY MOUTH DAILY.  90 tablet  1  . niacin (NIASPAN) 1000 MG CR tablet TAKE 1 TABLET BY MOUTH EVERY DAY  90 tablet  1  . omega-3 acid ethyl esters (LOVAZA) 1 G capsule TAKE 2 CAPSULES TWICE DAILY  360 capsule  0   No current facility-administered medications for this visit.    BP 124/68  Pulse 69  Ht 5\' 11"  (1.803 m)  Wt 179 lb (81.194 kg)  BMI 24.98 kg/m2  SpO2 97% General: NAD Neck: No JVD,  no thyromegaly or thyroid nodule.  Lungs: Clear to auscultation bilaterally with normal respiratory effort. CV: Nondisplaced PMI.  Heart regular S1/S2, widely split S2, no S3/S4, no murmur.  No peripheral edema.  No carotid bruit.  Normal pedal pulses.  Abdomen: Soft, nontender, no hepatosplenomegaly, no distention.  Skin: Intact without lesions or rashes.  Neurologic: Alert and oriented x 3.  Psych: Normal affect. Extremities: No clubbing or cyanosis.  HEENT: Normal  Assessment/Plan: 1. Abnormal ECG: Patient has LAFB with RBBB.  No prior ECG to compare.  Given lack of symptoms, I do not think this is likely to represent significant coronary disease.  - I will get an echo to assess for any cardiac structural abnormalities.   - He probably has some risk of progression of cardiac conduction system disease in the future.  Would make sure he gets an ECG yearly.  2. Cardiac risk: Patient's risk factors are diabetes and hyperlipidemia.  Parents have vascular disease but not premature onset.  I would agree with continuing ASA 81, statin, and ACEI.  3. Hyperlipidemia: Excellent lipid profile in 8/15.  Continue current statin.   Loralie Champagne 10/11/2014

## 2014-10-17 ENCOUNTER — Ambulatory Visit (HOSPITAL_COMMUNITY): Payer: BC Managed Care – PPO | Attending: Cardiology | Admitting: Cardiology

## 2014-10-17 DIAGNOSIS — E785 Hyperlipidemia, unspecified: Secondary | ICD-10-CM | POA: Diagnosis not present

## 2014-10-17 DIAGNOSIS — E119 Type 2 diabetes mellitus without complications: Secondary | ICD-10-CM | POA: Insufficient documentation

## 2014-10-17 DIAGNOSIS — R011 Cardiac murmur, unspecified: Secondary | ICD-10-CM

## 2014-10-17 DIAGNOSIS — I4510 Unspecified right bundle-branch block: Secondary | ICD-10-CM

## 2014-10-17 NOTE — Progress Notes (Signed)
Echo performed. 

## 2014-10-18 LAB — HM DIABETES EYE EXAM

## 2014-10-19 ENCOUNTER — Telehealth: Payer: Self-pay | Admitting: Cardiology

## 2014-10-19 NOTE — Telephone Encounter (Signed)
patient informed of test results

## 2014-10-19 NOTE — Telephone Encounter (Signed)
New problem   Returning a call from triage.

## 2014-11-27 ENCOUNTER — Telehealth: Payer: Self-pay | Admitting: Family Medicine

## 2014-11-27 NOTE — Telephone Encounter (Signed)
Left message on all numbers,   Mr. Bannister new appt was scheduled for Dec. 28 at 9:30 with Dr. Laurance Flatten. That was the first available.

## 2014-11-30 ENCOUNTER — Ambulatory Visit: Payer: BC Managed Care – PPO | Admitting: Family Medicine

## 2014-12-01 ENCOUNTER — Other Ambulatory Visit: Payer: Self-pay | Admitting: Family Medicine

## 2014-12-08 ENCOUNTER — Other Ambulatory Visit: Payer: Self-pay | Admitting: Family Medicine

## 2014-12-13 ENCOUNTER — Telehealth: Payer: Self-pay | Admitting: Family Medicine

## 2014-12-13 MED ORDER — DOXYCYCLINE HYCLATE 100 MG PO TABS: 100.0000 mg | ORAL_TABLET | Freq: Two times a day (BID) | ORAL | Status: DC

## 2014-12-13 NOTE — Telephone Encounter (Signed)
Approved med per [facility] and sent to [facility] Also to use saline nose spray     Pt aware

## 2014-12-18 ENCOUNTER — Other Ambulatory Visit: Payer: Self-pay | Admitting: Family Medicine

## 2014-12-24 ENCOUNTER — Ambulatory Visit (INDEPENDENT_AMBULATORY_CARE_PROVIDER_SITE_OTHER): Payer: BC Managed Care – PPO | Admitting: Family Medicine

## 2014-12-24 ENCOUNTER — Encounter: Payer: Self-pay | Admitting: Family Medicine

## 2014-12-24 VITALS — BP 139/83 | HR 66 | Temp 97.1°F | Ht 71.0 in | Wt 181.0 lb

## 2014-12-24 DIAGNOSIS — E559 Vitamin D deficiency, unspecified: Secondary | ICD-10-CM

## 2014-12-24 DIAGNOSIS — E119 Type 2 diabetes mellitus without complications: Secondary | ICD-10-CM

## 2014-12-24 DIAGNOSIS — J069 Acute upper respiratory infection, unspecified: Secondary | ICD-10-CM

## 2014-12-24 DIAGNOSIS — N400 Enlarged prostate without lower urinary tract symptoms: Secondary | ICD-10-CM

## 2014-12-24 DIAGNOSIS — E785 Hyperlipidemia, unspecified: Secondary | ICD-10-CM

## 2014-12-24 LAB — POCT CBC
Granulocyte percent: 62.1 %G (ref 37–80)
HCT, POC: 48.8 % (ref 43.5–53.7)
Hemoglobin: 15.1 g/dL (ref 14.1–18.1)
Lymph, poc: 2.8 (ref 0.6–3.4)
MCH, POC: 27.1 pg (ref 27–31.2)
MCHC: 31 g/dL — AB (ref 31.8–35.4)
MCV: 87.4 fL (ref 80–97)
MPV: 6.7 fL (ref 0–99.8)
POC Granulocyte: 5.2 (ref 2–6.9)
POC LYMPH %: 33.7 % (ref 10–50)
Platelet Count, POC: 275 10*3/uL (ref 142–424)
RBC: 5.6 M/uL (ref 4.69–6.13)
RDW, POC: 12.3 %
WBC: 8.3 10*3/uL (ref 4.6–10.2)

## 2014-12-24 LAB — POCT GLYCOSYLATED HEMOGLOBIN (HGB A1C): Hemoglobin A1C: 7.7

## 2014-12-24 MED ORDER — SITAGLIPTIN PHOSPHATE 100 MG PO TABS: 100.0000 mg | ORAL_TABLET | Freq: Every day | ORAL | Status: DC

## 2014-12-24 MED ORDER — NIACIN ER (ANTIHYPERLIPIDEMIC) 1000 MG PO TBCR: 1000.0000 mg | EXTENDED_RELEASE_TABLET | Freq: Every day | ORAL | Status: DC

## 2014-12-24 MED ORDER — LISINOPRIL 5 MG PO TABS: ORAL_TABLET | ORAL | Status: DC

## 2014-12-24 NOTE — Progress Notes (Signed)
Subjective:    Patient ID: Peter Mathis, male    DOB: 1951/05/17, 63 y.o.   MRN: [id number]  HPI Pt here for follow up and management of chronic medical problems. The patient is doing well today. He has some complaints of congestion. The congestion is improving. She has finished a recent round of doxycycline. He is requesting refills on 3 of his medications. He is due to get lab work today and he will be given an FOBT to return.         Patient Active Problem List   Diagnosis Date Noted  . At risk for cardiac dysfunction 10/11/2014  . Right bundle branch block, history of 01/17/2014  . Hyperlipemia 07/12/2013  . Type II diabetes mellitus 07/12/2013  . BPH (benign prostatic hyperplasia) 07/12/2013   Outpatient Encounter Prescriptions as of 12/24/2014  Medication Sig  . aspirin (ASPIRIN LOW DOSE) 81 MG EC tablet Take 81 mg by mouth daily.    [name] atorvastatin (LIPITOR) 80 MG tablet TAKE 1 TABLET AT BEDTIME  . azelastine (ASTELIN) 137 MCG/SPRAY nasal spray 2 sprays each nostril at bedtime  . Cholecalciferol (VITAMIN D3) 5000 UNITS CAPS Take by mouth.    [name] glucose blood (ONE TOUCH ULTRA TEST) test strip Test bid, Dx 250.00  . JANUVIA 100 MG tablet TAKE 1 TABLET BY MOUTH DAILY  . lisinopril (PRINIVIL,ZESTRIL) 5 MG tablet TAKE 1 TABLET (5 MG TOTAL) BY MOUTH DAILY.  . niacin (NIASPAN) 1000 MG CR tablet TAKE 1 TABLET BY MOUTH EVERY DAY  . omega-3 acid ethyl esters (LOVAZA) 1 G capsule TAKE 2 CAPSULES TWICE DAILY  . [DISCONTINUED] doxycycline (VIBRA-TABS) 100 MG tablet Take 1 tablet (100 mg total) by mouth 2 (two) times daily.    Review of Systems  Constitutional: Negative.   HENT: Positive for congestion.   Eyes: Negative.   Respiratory: Negative.   Cardiovascular: Negative.   Gastrointestinal: Negative.   Endocrine: Negative.   Genitourinary: Negative.   Musculoskeletal: Negative.   Skin: Negative.   Allergic/Immunologic: Negative.   Neurological: Negative.   Hematological:  Negative.   Psychiatric/Behavioral: Negative.        Objective:   Physical Exam  Constitutional: He is oriented to person, place, and time. He appears well-developed and well-nourished. No distress.  Alert and pleasant  HENT:  Head: Normocephalic and atraumatic.  Right Ear: External ear normal.  Left Ear: External ear normal.  Mouth/Throat: Oropharynx is clear and moist. No oropharyngeal exudate.  Minimal nasal congestion bilaterally  Eyes: Conjunctivae and EOM are normal. Pupils are equal, round, and reactive to light. Right eye exhibits no discharge. Left eye exhibits no discharge. No scleral icterus.  Neck: Normal range of motion. Neck supple. No tracheal deviation present. No thyromegaly present.  No anterior cervical adenopathy or bruits  Cardiovascular: Normal rate, regular rhythm, normal heart sounds and intact distal pulses.  Exam reveals no gallop and no friction rub.   No murmur heard. At 72/m  Pulmonary/Chest: Effort normal and breath sounds normal. No respiratory distress. He has no wheezes. He has no rales. He exhibits no tenderness.  Clear anteriorly and posteriorly. No axillary adenopathy.  Abdominal: Soft. Bowel sounds are normal. He exhibits no mass. There is no tenderness. There is no rebound and no guarding.  Soft without masses or tenderness or inguinal adenopathy  Musculoskeletal: Normal range of motion. He exhibits no edema or tenderness.  Lymphadenopathy:    He has no cervical adenopathy.  Neurological: He is alert and oriented to person, place,  and time. He has normal reflexes. No cranial nerve deficit.  Skin: Skin is warm and dry. No rash noted. No erythema. No pallor.  Psychiatric: He has a normal mood and affect. His behavior is normal. Judgment and thought content normal.  Nursing note and vitals reviewed.  BP 139/83 mmHg  Pulse 66  Temp(Src) 97.1 F (36.2 C) (Oral)  Ht 5' 11" (1.803 m)  Wt 181 lb (82.101 kg)  BMI 25.26 kg/m2        Assessment  & Plan:  1. Hyperlipemia - POCT CBC - BMP8+EGFR - Hepatic function panel - NMR, lipoprofile  2. Type 2 diabetes mellitus without complication - POCT CBC - POCT glycosylated hemoglobin (Hb A1C) - BMP8+EGFR  3. BPH (benign prostatic hyperplasia) - POCT CBC  4. Vitamin D deficiency - POCT CBC - Vit D  25 hydroxy (rtn osteoporosis monitoring)  5. URI (upper respiratory infection) -Resolving, continue saline nose spray and Mucinex Meds ordered this encounter  Medications  . lisinopril (PRINIVIL,ZESTRIL) 5 MG tablet    Sig: TAKE 1 TABLET (5 MG TOTAL) BY MOUTH DAILY.    Dispense:  90 tablet    Refill:  1  . sitaGLIPtin (JANUVIA) 100 MG tablet    Sig: Take 1 tablet (100 mg total) by mouth daily.    Dispense:  90 tablet    Refill:  1  . niacin (NIASPAN) 1000 MG CR tablet    Sig: Take 1 tablet (1,000 mg total) by mouth daily.    Dispense:  90 tablet    Refill:  1   Patient Instructions                       Medicare Annual Wellness Visit  [place] and the medical providers at [facility] strive to bring you the best medical care.  In doing so we not only want to address your current medical conditions and concerns but also to detect new conditions early and prevent illness, disease and health-related problems.    Medicare offers a yearly Wellness Visit which allows our clinical staff to assess your need for preventative services including immunizations, lifestyle education, counseling to decrease risk of preventable diseases and screening for fall risk and other medical concerns.    This visit is provided free of charge (no copay) for all Medicare recipients. The clinical pharmacists at [facility] have begun to conduct these Wellness Visits which will also include a thorough review of all your medications.    As you primary medical provider recommend that you make an appointment for your Annual Wellness Visit if you have not done so  already this year.  You may set up this appointment before you leave today or you may call back ([phone]) and schedule an appointment.  Please make sure when you call that you mention that you are scheduling your Annual Wellness Visit with the clinical pharmacist so that the appointment may be made for the proper length of time.      Continue current medications. Continue good therapeutic lifestyle changes which include good diet and exercise. Fall precautions discussed with patient. If an FOBT was given today- please return it to our front desk. If you are over 50 years old - you may need Prevnar 13 or the adult Pneumonia vaccine.  Flu Shots will be available at our office starting mid- September. Please call and schedule a FLU CLINIC APPOINTMENT.   Return to clinic and get your Prevnar   vaccine when you turn 65 years old Try to get more exercise Continue to drink plenty of water   Don W. Moore MD   

## 2014-12-24 NOTE — Patient Instructions (Addendum)
Medicare Annual Wellness Visit  [facility] and the medical providers at [facility] strive to bring you the best medical care.  In doing so we not only want to address your current medical conditions and concerns but also to detect new conditions early and prevent illness, disease and health-related problems.    Medicare offers a yearly Wellness Visit which allows our clinical staff to assess your need for preventative services including immunizations, lifestyle education, counseling to decrease risk of preventable diseases and screening for fall risk and other medical concerns.    This visit is provided free of charge (no copay) for all Medicare recipients. The clinical pharmacists at [facility] have begun to conduct these Wellness Visits which will also include a thorough review of all your medications.    As you primary medical provider recommend that you make an appointment for your Annual Wellness Visit if you have not done so already this year.  You may set up this appointment before you leave today or you may call back ([phone]) and schedule an appointment.  Please make sure when you call that you mention that you are scheduling your Annual Wellness Visit with the clinical pharmacist so that the appointment may be made for the proper length of time.      Continue current medications. Continue good therapeutic lifestyle changes which include good diet and exercise. Fall precautions discussed with patient. If an FOBT was given today- please return it to our front desk. If you are over 80 years old - you may need Prevnar 51 or the adult Pneumonia vaccine.  Flu Shots will be available at our office starting mid- September. Please call and schedule a FLU CLINIC APPOINTMENT.   Return to clinic and get your Prevnar vaccine when you turn 63 years old Try to get more exercise Continue to drink plenty of water

## 2014-12-25 ENCOUNTER — Telehealth: Payer: Self-pay

## 2014-12-25 LAB — BMP8+EGFR
BUN/Creatinine Ratio: 16 (ref 10–22)
BUN: 12 mg/dL (ref 8–27)
CALCIUM: 9.5 mg/dL (ref 8.6–10.2)
CO2: 28 mmol/L (ref 18–29)
Chloride: 99 mmol/L (ref 97–108)
Creatinine, Ser: 0.74 mg/dL — ABNORMAL LOW (ref 0.76–1.27)
GFR calc Af Amer: 113 mL/min/{1.73_m2} (ref >59)
GFR calc non Af Amer: 97 mL/min/{1.73_m2} (ref >59)
Glucose: 181 mg/dL — ABNORMAL HIGH (ref 65–99)
Potassium: 5 mmol/L (ref 3.5–5.2)
Sodium: 139 mmol/L (ref 134–144)

## 2014-12-25 LAB — NMR, LIPOPROFILE
Cholesterol: 109 mg/dL (ref 100–199)
HDL CHOLESTEROL BY NMR: 44 mg/dL (ref >39)
HDL Particle Number: 31.9 umol/L (ref >=30.5)
LDL Particle Number: 622 nmol/L (ref <1000)
LDL Size: 20.1 nm (ref >20.5)
LDL-C: 49 mg/dL (ref 0–99)
LP-IR Score: 45 (ref <=45)
Small LDL Particle Number: 415 nmol/L (ref <=527)
Triglycerides by NMR: 80 mg/dL (ref 0–149)

## 2014-12-25 LAB — HEPATIC FUNCTION PANEL
ALT: 24 IU/L (ref 0–44)
AST: 15 IU/L (ref 0–40)
Albumin: 4.1 g/dL (ref 3.6–4.8)
Alkaline Phosphatase: 60 IU/L (ref 39–117)
BILIRUBIN DIRECT: 0.14 mg/dL (ref 0.00–0.40)
BILIRUBIN TOTAL: 0.3 mg/dL (ref 0.0–1.2)
Total Protein: 6.5 g/dL (ref 6.0–8.5)

## 2014-12-25 LAB — VITAMIN D 25 HYDROXY (VIT D DEFICIENCY, FRACTURES): VIT D 25 HYDROXY: 41.2 ng/mL (ref 30.0–100.0)

## 2014-12-25 NOTE — Telephone Encounter (Signed)
Pt aware of results 

## 2014-12-25 NOTE — Telephone Encounter (Signed)
-----   Message from Chipper Herb, MD sent at 12/25/2014  7:27 AM EST ----- The blood sugar is elevated higher than it has been in the past at 181. The creatinine the most important kidney function test is at the low end of the normal range. The electrolytes including potassium are within normal limits. All liver function tests are within normal limits All cholesterol numbers with advanced lipid testing are excellent and at goal including the triglycerides. Continue current treatment but once again increase activity level and watch diet more closely because of the elevated hemoglobin A1c and blood sugar. The vitamin D level is good and within normal limits, continue current treatment

## 2015-01-10 ENCOUNTER — Other Ambulatory Visit: Payer: Self-pay | Admitting: Family Medicine

## 2015-01-11 ENCOUNTER — Telehealth: Payer: Self-pay | Admitting: Family Medicine

## 2015-01-11 NOTE — Telephone Encounter (Signed)
Left detailed message stating we don't have any samples of januvia, to CB if he needs [place] to send in a RX.

## 2015-02-22 ENCOUNTER — Telehealth: Payer: Self-pay | Admitting: *Deleted

## 2015-02-22 NOTE — Telephone Encounter (Signed)
Please check with Tammy for the equivalent dose of Onglyza for Januvia and that will be due call in to his pharmacy.

## 2015-02-22 NOTE — Telephone Encounter (Addendum)
Dr. Laurance Flatten ins, denied Atreyu's Januvia, they will be sending a letter hopefully telling us what they will cover.  On cover my meds they only say favorable or unfavorable and that a letter will follow. Sorry! I just got the letter and the only option they give [place] to try is onglyza.

## 2015-02-22 NOTE — Telephone Encounter (Signed)
Okay, wait for that led her to see what they will approve

## 2015-02-22 NOTE — Telephone Encounter (Signed)
onglyza is only one.

## 2015-02-25 ENCOUNTER — Telehealth: Payer: Self-pay | Admitting: *Deleted

## 2015-02-25 MED ORDER — SAXAGLIPTIN HCL 5 MG PO TABS: 5.0000 mg | ORAL_TABLET | Freq: Every day | ORAL | Status: DC

## 2015-02-25 NOTE — Telephone Encounter (Signed)
Tammy please look at note from Dr Laurance Flatten regarding changing his [place] to onglyza.  Thanks!

## 2015-02-25 NOTE — Telephone Encounter (Signed)
duplicate

## 2015-02-27 NOTE — Telephone Encounter (Signed)
onglyza ordered instead of [place].

## 2015-03-02 ENCOUNTER — Other Ambulatory Visit: Payer: Self-pay | Admitting: Family Medicine

## 2015-03-21 ENCOUNTER — Telehealth: Payer: Self-pay | Admitting: Family Medicine

## 2015-03-21 MED ORDER — JANUVIA 100 MG PO TABS: 100.0000 mg | ORAL_TABLET | Freq: Every day | ORAL | Status: DC

## 2015-03-21 NOTE — Telephone Encounter (Signed)
Has new ins and will stay on januvia. Spoke with pt and rf done

## 2015-05-02 ENCOUNTER — Encounter: Payer: Self-pay | Admitting: Family Medicine

## 2015-05-02 ENCOUNTER — Ambulatory Visit (INDEPENDENT_AMBULATORY_CARE_PROVIDER_SITE_OTHER): Payer: Medicare Other | Admitting: Family Medicine

## 2015-05-02 ENCOUNTER — Ambulatory Visit (INDEPENDENT_AMBULATORY_CARE_PROVIDER_SITE_OTHER): Payer: Medicare Other

## 2015-05-02 VITALS — BP 138/70 | HR 70 | Temp 97.4°F | Ht 71.0 in | Wt 175.0 lb

## 2015-05-02 DIAGNOSIS — Z23 Encounter for immunization: Secondary | ICD-10-CM

## 2015-05-02 DIAGNOSIS — E785 Hyperlipidemia, unspecified: Secondary | ICD-10-CM | POA: Diagnosis not present

## 2015-05-02 DIAGNOSIS — M79675 Pain in left toe(s): Secondary | ICD-10-CM

## 2015-05-02 DIAGNOSIS — E559 Vitamin D deficiency, unspecified: Secondary | ICD-10-CM | POA: Diagnosis not present

## 2015-05-02 DIAGNOSIS — E119 Type 2 diabetes mellitus without complications: Secondary | ICD-10-CM | POA: Diagnosis not present

## 2015-05-02 DIAGNOSIS — N400 Enlarged prostate without lower urinary tract symptoms: Secondary | ICD-10-CM

## 2015-05-02 DIAGNOSIS — N5201 Erectile dysfunction due to arterial insufficiency: Secondary | ICD-10-CM | POA: Diagnosis not present

## 2015-05-02 DIAGNOSIS — B351 Tinea unguium: Secondary | ICD-10-CM | POA: Diagnosis not present

## 2015-05-02 LAB — POCT CBC
Granulocyte percent: 67 %G (ref 37–80)
HCT, POC: 48.4 % (ref 43.5–53.7)
Hemoglobin: 15.5 g/dL (ref 14.1–18.1)
LYMPH, POC: 2.3 (ref 0.6–3.4)
MCH: 28.2 pg (ref 27–31.2)
MCHC: 32.1 g/dL (ref 31.8–35.4)
MCV: 88.1 fL (ref 80–97)
MPV: 7.7 fL (ref 0–99.8)
PLATELET COUNT, POC: 241 10*3/uL (ref 142–424)
POC Granulocyte: 5.2 (ref 2–6.9)
POC LYMPH %: 29.3 % (ref 10–50)
RBC: 5.49 M/uL (ref 4.69–6.13)
RDW, POC: 13.2 %
WBC: 7.7 10*3/uL (ref 4.6–10.2)

## 2015-05-02 LAB — POCT GLYCOSYLATED HEMOGLOBIN (HGB A1C): Hemoglobin A1C: 8.2

## 2015-05-02 MED ORDER — JANUVIA 100 MG PO TABS: 100.0000 mg | ORAL_TABLET | Freq: Every day | ORAL | Status: DC

## 2015-05-02 MED ORDER — TERBINAFINE HCL 250 MG PO TABS: 250.0000 mg | ORAL_TABLET | Freq: Every day | ORAL | Status: DC

## 2015-05-02 NOTE — Patient Instructions (Addendum)
Medicare Annual Wellness Visit  [facility] and the medical providers at [facility] strive to bring you the best medical care.  In doing so we not only want to address your current medical conditions and concerns but also to detect new conditions early and prevent illness, disease and health-related problems.    Medicare offers a yearly Wellness Visit which allows our clinical staff to assess your need for preventative services including immunizations, lifestyle education, counseling to decrease risk of preventable diseases and screening for fall risk and other medical concerns.    This visit is provided free of charge (no copay) for all Medicare recipients. The clinical pharmacists at [facility] have begun to conduct these Wellness Visits which will also include a thorough review of all your medications.    As you primary medical provider recommend that you make an appointment for your Annual Wellness Visit if you have not done so already this year.  You may set up this appointment before you leave today or you may call back ([phone]) and schedule an appointment.  Please make sure when you call that you mention that you are scheduling your Annual Wellness Visit with the clinical pharmacist so that the appointment may be made for the proper length of time.      Continue current medications. Continue good therapeutic lifestyle changes which include good diet and exercise. Fall precautions discussed with patient. If an FOBT was given today- please return it to our front desk. If you are over 74 years old - you may need Prevnar 52 or the adult Pneumonia vaccine.  Flu Shots are still available at our office. If you still haven't had one please call to set up a nurse visit to get one.   After your visit with [place] today you will receive a survey in the mail or online from [employer] regarding your care with [place]. Please take a moment to  fill this out. Your feedback is very important to [place] as you can help [place] better understand your patient needs as well as improve your experience and satisfaction. WE CARE ABOUT YOU!!!     Pneumococcal Vaccine, Polyvalent suspension for injection What is this medicine? PNEUMOCOCCAL VACCINE, POLYVALENT (NEU mo KOK al vak SEEN, pol ee VEY luhnt) is a vaccine to prevent pneumococcus bacteria infection. These bacteria are a major cause of ear infections, 'Strep throat' infections, and serious pneumonia, meningitis, or blood infections worldwide. These vaccines help the body to produce antibodies (protective substances) that help your body defend against these bacteria. This vaccine is recommended for infants and young children. This vaccine will not treat an infection. This medicine may be used for other purposes; ask your health care provider or pharmacist if you have questions. COMMON BRAND NAME(S): Prevnar 13 What should I tell my health care provider before I take this medicine? They need to know if you have any of these conditions: -bleeding problems -fever -immune system problems -low platelet count in the blood -seizures -an unusual or allergic reaction to pneumococcal vaccine, diphtheria toxoid, other vaccines, latex, other medicines, foods, dyes, or preservatives -pregnant or trying to get pregnant -breast-feeding How should I use this medicine? This vaccine is for injection into a muscle. It is given by a health care professional. A copy of Vaccine Information Statements will be given before each vaccination. Read this sheet carefully each time. The sheet may change frequently. Talk to your pediatrician regarding the use of this medicine in children. While  this drug may be prescribed for children as young as 3 weeks old for selected conditions, precautions do apply. Overdosage: If you think you have taken too much of this medicine contact a poison control center or emergency room at  once. NOTE: This medicine is only for you. Do not share this medicine with others. What if I miss a dose? It is important not to miss your dose. Call your doctor or health care professional if you are unable to keep an appointment. What may interact with this medicine? -medicines for cancer chemotherapy -medicines that suppress your immune function -medicines that treat or prevent blood clots like warfarin, enoxaparin, and dalteparin -steroid medicines like prednisone or cortisone This list may not describe all possible interactions. Give your health care provider a list of all the medicines, herbs, non-prescription drugs, or dietary supplements you use. Also tell them if you smoke, drink alcohol, or use illegal drugs. Some items may interact with your medicine. What should I watch for while using this medicine? Mild fever and pain should go away in 3 days or less. Report any unusual symptoms to your doctor or health care professional. What side effects may I notice from receiving this medicine? Side effects that you should report to your doctor or health care professional as soon as possible: -allergic reactions like skin rash, itching or hives, swelling of the face, lips, or tongue -breathing problems -confused -fever over 102 degrees F -pain, tingling, numbness in the hands or feet -seizures -unusual bleeding or bruising -unusual muscle weakness Side effects that usually do not require medical attention (report to your doctor or health care professional if they continue or are bothersome): -aches and pains -diarrhea -fever of 102 degrees F or less -headache -irritable -loss of appetite -pain, tender at site where injected -trouble sleeping This list may not describe all possible side effects. Call your doctor for medical advice about side effects. You may report side effects to FDA at 1-800-FDA-1088. Where should I keep my medicine? This does not apply. This vaccine is given in a  clinic, pharmacy, doctor's office, or other health care setting and will not be stored at home. NOTE: This sheet is a summary. It may not cover all possible information. If you have questions about this medicine, talk to your doctor, pharmacist, or health care provider.  2015, Elsevier/Gold Standard. (2009-02-26 10:17:22)   Use scent free fabric softener soaps and detergents Try Lamisil cream twice daily on the areas question on your legs and applying this twice daily Take the Lamisil tablets 1 daily for 3 months and check liver function tests in 4 weeks Try the Cialis 20 mg and does take one half as needed for sexual activity

## 2015-05-02 NOTE — Progress Notes (Signed)
Subjective:    Patient ID: Peter Mathis, male    DOB: March 12, 1951, 64 y.o.   MRN: [id number]  HPI  64 year old male comes in today to follow up on chronic medical conditions which include diabetes, hyperlipidemia, and BPH. He does complain of intermittent left great toe redness and pain. No injury and no history of gout. This resolves on it's own.   Patient Active Problem List   Diagnosis Date Noted  . At risk for cardiac dysfunction 10/11/2014  . Right bundle branch block, history of 01/17/2014  . Hyperlipemia 07/12/2013  . Type II diabetes mellitus 07/12/2013  . BPH (benign prostatic hyperplasia) 07/12/2013   Outpatient Encounter Prescriptions as of 05/02/2015  Medication Sig  . aspirin (ASPIRIN LOW DOSE) 81 MG EC tablet Take 81 mg by mouth daily.    [name] atorvastatin (LIPITOR) 80 MG tablet TAKE 1 TABLET AT BEDTIME  . azelastine (ASTELIN) 137 MCG/SPRAY nasal spray 2 sprays each nostril at bedtime  . Cholecalciferol (VITAMIN D3) 5000 UNITS CAPS Take by mouth.    [name] glucose blood (ONE TOUCH ULTRA TEST) test strip Test bid, Dx 250.00  . JANUVIA 100 MG tablet Take 1 tablet (100 mg total) by mouth daily.  [name] lisinopril (PRINIVIL,ZESTRIL) 5 MG tablet TAKE 1 TABLET (5 MG TOTAL) BY MOUTH DAILY.  . niacin (NIASPAN) 1000 MG CR tablet Take 1 tablet (1,000 mg total) by mouth daily.  [name] omega-3 acid ethyl esters (LOVAZA) 1 G capsule TAKE 2 CAPSULES TWICE DAILY   No facility-administered encounter medications on file as of 05/02/2015.       Review of Systems  Constitutional: Negative.   HENT: Negative.   Eyes: Negative.   Respiratory: Negative.   Cardiovascular: Negative.   Gastrointestinal: Negative.   Endocrine: Negative.   Genitourinary: Negative.   Musculoskeletal: Positive for arthralgias (Left great toe intermittent redness and pain. ).  Skin: Negative.   Allergic/Immunologic: Negative.   Neurological: Negative.   Hematological: Negative.   Psychiatric/Behavioral: Negative.       Objective:   Physical Exam  Constitutional: He is oriented to person, place, and time. He appears well-developed and well-nourished. No distress.  The patient is pleasant and alert and complains of left great toe pain at times with redness and erythema  HENT:  Head: Normocephalic and atraumatic.  Right Ear: External ear normal.  Left Ear: External ear normal.  Mouth/Throat: Oropharynx is clear and moist. No oropharyngeal exudate.  Nasal congestion  Eyes: Conjunctivae and EOM are normal. Pupils are equal, round, and reactive to light. Right eye exhibits no discharge. Left eye exhibits no discharge. No scleral icterus.  Neck: Normal range of motion. Neck supple. No thyromegaly present.  No thyromegaly or carotid bruits  Cardiovascular: Normal rate, regular rhythm, normal heart sounds and intact distal pulses.  Exam reveals no gallop and no friction rub.   No murmur heard. At 60/m  Pulmonary/Chest: Effort normal and breath sounds normal. No respiratory distress. He has no wheezes. He has no rales. He exhibits no tenderness.  No axillary adenopathy  Abdominal: Soft. Bowel sounds are normal. He exhibits no mass. There is no tenderness. There is no rebound and no guarding.  No abdominal tenderness or inguinal adenopathy  Musculoskeletal: Normal range of motion. He exhibits no edema or tenderness.  The left great toe is slightly more swollen than the right but nontender today and no erythema or rubor.  Lymphadenopathy:    He has no cervical adenopathy.  Neurological: He is alert and  oriented to person, place, and time. He has normal reflexes. No cranial nerve deficit.  Skin: Skin is warm and dry. No rash noted. No erythema. No pallor.  Psychiatric: He has a normal mood and affect. His behavior is normal. Judgment and thought content normal.  Nursing note and vitals reviewed. [facility] reading (PRIMARY) by  Dr. Louretta Parma foot for left great toe--  arthritic changes in joint space narrowing                                    BP 138/70 mmHg  Pulse 70  Temp(Src) 97.4 F (36.3 C) (Oral)  Ht 5' 11" (1.803 m)  Wt 175 lb (79.379 kg)  BMI 24.42 kg/m2     Assessment & Plan:  1. Type 2 diabetes mellitus without complication -The patient brings in home blood sugars for review and they have been averaging around 150 according to the patient. Any changes in medicine will be determined when lab work is returned - POCT glycosylated hemoglobin (Hb A1C) - POCT CBC - BMP8+EGFR  2. BPH (benign prostatic hyperplasia) -The patient is having no symptoms with voiding.  3. Hyperlipemia -The patient should continue current treatment pending results of lab work - Hepatic function panel  4. Vitamin D deficiency -Patient should continue current treatment pending results of lab work - Vit D  25 hydroxy (rtn osteoporosis monitoring)  5. Toe pain, left -The toe pain certainly sounds like episodes of gout and we will make sure the uric acid is good and get an x-ray for purposes of making sure there is no arthritic problem. - Uric acid - DG Foot Complete Left; Future  6. Toenail fungus -The patient will take this regularly for 3 months and will come in and have liver function tests done in 4 weeks - terbinafine (LAMISIL) 250 MG tablet; Take 1 tablet (250 mg total) by mouth daily.  Dispense: 30 tablet; Refill: 2  7. Erectile dysfunction -Samples of Cialis 20 given for patient to try  Meds ordered this encounter  Medications  . terbinafine (LAMISIL) 250 MG tablet    Sig: Take 1 tablet (250 mg total) by mouth daily.    Dispense:  30 tablet    Refill:  2  . JANUVIA 100 MG tablet    Sig: Take 1 tablet (100 mg total) by mouth daily.    Dispense:  90 tablet    Refill:  1      Patient Instructions                       Medicare Annual Wellness Visit  [facility] and the medical providers at [facility] strive to bring you the best medical care.  In doing so we not only  want to address your current medical conditions and concerns but also to detect new conditions early and prevent illness, disease and health-related problems.    Medicare offers a yearly Wellness Visit which allows our clinical staff to assess your need for preventative services including immunizations, lifestyle education, counseling to decrease risk of preventable diseases and screening for fall risk and other medical concerns.    This visit is provided free of charge (no copay) for all Medicare recipients. The clinical pharmacists at [facility] have begun to conduct these Wellness Visits which will also include a thorough review of all your medications.    As you  primary medical provider recommend that you make an appointment for your Annual Wellness Visit if you have not done so already this year.  You may set up this appointment before you leave today or you may call back ([phone]) and schedule an appointment.  Please make sure when you call that you mention that you are scheduling your Annual Wellness Visit with the clinical pharmacist so that the appointment may be made for the proper length of time.      Continue current medications. Continue good therapeutic lifestyle changes which include good diet and exercise. Fall precautions discussed with patient. If an FOBT was given today- please return it to our front desk. If you are over 6 years old - you may need Prevnar 13 or the adult Pneumonia vaccine.  Flu Shots are still available at our office. If you still haven't had one please call to set up a nurse visit to get one.   After your visit with [place] today you will receive a survey in the mail or online from [employer] regarding your care with [place]. Please take a moment to fill this out. Your feedback is very important to [place] as you can help [place] better understand your patient needs as well as improve your experience and satisfaction. WE CARE ABOUT YOU!!!      Pneumococcal Vaccine, Polyvalent suspension for injection What is this medicine? PNEUMOCOCCAL VACCINE, POLYVALENT (NEU mo KOK al vak SEEN, pol ee VEY luhnt) is a vaccine to prevent pneumococcus bacteria infection. These bacteria are a major cause of ear infections, 'Strep throat' infections, and serious pneumonia, meningitis, or blood infections worldwide. These vaccines help the body to produce antibodies (protective substances) that help your body defend against these bacteria. This vaccine is recommended for infants and young children. This vaccine will not treat an infection. This medicine may be used for other purposes; ask your health care provider or pharmacist if you have questions. COMMON BRAND NAME(S): Prevnar 13 What should I tell my health care provider before I take this medicine? They need to know if you have any of these conditions: -bleeding problems -fever -immune system problems -low platelet count in the blood -seizures -an unusual or allergic reaction to pneumococcal vaccine, diphtheria toxoid, other vaccines, latex, other medicines, foods, dyes, or preservatives -pregnant or trying to get pregnant -breast-feeding How should I use this medicine? This vaccine is for injection into a muscle. It is given by a health care professional. A copy of Vaccine Information Statements will be given before each vaccination. Read this sheet carefully each time. The sheet may change frequently. Talk to your pediatrician regarding the use of this medicine in children. While this drug may be prescribed for children as young as 65 weeks old for selected conditions, precautions do apply. Overdosage: If you think you have taken too much of this medicine contact a poison control center or emergency room at once. NOTE: This medicine is only for you. Do not share this medicine with others. What if I miss a dose? It is important not to miss your dose. Call your doctor or health care  professional if you are unable to keep an appointment. What may interact with this medicine? -medicines for cancer chemotherapy -medicines that suppress your immune function -medicines that treat or prevent blood clots like warfarin, enoxaparin, and dalteparin -steroid medicines like prednisone or cortisone This list may not describe all possible interactions. Give your health care provider a list of all the medicines, herbs, non-prescription drugs, or  dietary supplements you use. Also tell them if you smoke, drink alcohol, or use illegal drugs. Some items may interact with your medicine. What should I watch for while using this medicine? Mild fever and pain should go away in 3 days or less. Report any unusual symptoms to your doctor or health care professional. What side effects may I notice from receiving this medicine? Side effects that you should report to your doctor or health care professional as soon as possible: -allergic reactions like skin rash, itching or hives, swelling of the face, lips, or tongue -breathing problems -confused -fever over 102 degrees F -pain, tingling, numbness in the hands or feet -seizures -unusual bleeding or bruising -unusual muscle weakness Side effects that usually do not require medical attention (report to your doctor or health care professional if they continue or are bothersome): -aches and pains -diarrhea -fever of 102 degrees F or less -headache -irritable -loss of appetite -pain, tender at site where injected -trouble sleeping This list may not describe all possible side effects. Call your doctor for medical advice about side effects. You may report side effects to FDA at 1-800-FDA-1088. Where should I keep my medicine? This does not apply. This vaccine is given in a clinic, pharmacy, doctor's office, or other health care setting and will not be stored at home. NOTE: This sheet is a summary. It may not cover all possible information. If you  have questions about this medicine, talk to your doctor, pharmacist, or health care provider.  2015, Elsevier/Gold Standard. (2009-02-26 10:17:22)   Use scent free fabric softener soaps and detergents Try Lamisil cream twice daily on the areas question on your legs and applying this twice daily Take the Lamisil tablets 1 daily for 3 months and check liver function tests in 4 weeks Try the Cialis 20 mg and does take one half as needed for sexual activity   samples were given of Cialis 20 and the patient was told to try half a one as needed. Arrie Senate MD

## 2015-05-03 LAB — VITAMIN D 25 HYDROXY (VIT D DEFICIENCY, FRACTURES): Vit D, 25-Hydroxy: 51.9 ng/mL (ref 30.0–100.0)

## 2015-05-03 LAB — BMP8+EGFR
BUN/Creatinine Ratio: 17 (ref 10–22)
BUN: 16 mg/dL (ref 8–27)
CHLORIDE: 98 mmol/L (ref 97–108)
CO2: 27 mmol/L (ref 18–29)
Calcium: 9.5 mg/dL (ref 8.6–10.2)
Creatinine, Ser: 0.96 mg/dL (ref 0.76–1.27)
GFR calc Af Amer: 95 mL/min/{1.73_m2} (ref >59)
GFR calc non Af Amer: 83 mL/min/{1.73_m2} (ref >59)
GLUCOSE: 168 mg/dL — AB (ref 65–99)
POTASSIUM: 4.9 mmol/L (ref 3.5–5.2)
Sodium: 136 mmol/L (ref 134–144)

## 2015-05-03 LAB — HEPATIC FUNCTION PANEL
ALK PHOS: 52 IU/L (ref 39–117)
ALT: 28 IU/L (ref 0–44)
AST: 20 IU/L (ref 0–40)
Albumin: 4.4 g/dL (ref 3.6–4.8)
BILIRUBIN TOTAL: 1.3 mg/dL — AB (ref 0.0–1.2)
BILIRUBIN, DIRECT: 0.29 mg/dL (ref 0.00–0.40)
Total Protein: 7 g/dL (ref 6.0–8.5)

## 2015-05-03 LAB — URIC ACID: Uric Acid: 4.7 mg/dL (ref 3.7–8.6)

## 2015-05-31 ENCOUNTER — Other Ambulatory Visit: Payer: Self-pay | Admitting: Family Medicine

## 2015-06-19 ENCOUNTER — Other Ambulatory Visit: Payer: Self-pay | Admitting: Family Medicine

## 2015-06-20 ENCOUNTER — Other Ambulatory Visit (INDEPENDENT_AMBULATORY_CARE_PROVIDER_SITE_OTHER): Payer: Medicare Other

## 2015-06-20 DIAGNOSIS — B351 Tinea unguium: Secondary | ICD-10-CM | POA: Diagnosis not present

## 2015-06-20 NOTE — Progress Notes (Signed)
Lab only 

## 2015-06-21 LAB — HEPATIC FUNCTION PANEL
ALK PHOS: 56 IU/L (ref 39–117)
ALT: 28 IU/L (ref 0–44)
AST: 20 IU/L (ref 0–40)
Albumin: 4.1 g/dL (ref 3.6–4.8)
BILIRUBIN, DIRECT: 0.2 mg/dL (ref 0.00–0.40)
Bilirubin Total: 0.7 mg/dL (ref 0.0–1.2)
TOTAL PROTEIN: 6.7 g/dL (ref 6.0–8.5)

## 2015-07-08 ENCOUNTER — Other Ambulatory Visit: Payer: Self-pay | Admitting: Family Medicine

## 2015-09-09 ENCOUNTER — Ambulatory Visit (INDEPENDENT_AMBULATORY_CARE_PROVIDER_SITE_OTHER): Payer: Medicare Other

## 2015-09-09 ENCOUNTER — Ambulatory Visit (INDEPENDENT_AMBULATORY_CARE_PROVIDER_SITE_OTHER): Payer: Medicare Other | Admitting: Family Medicine

## 2015-09-09 ENCOUNTER — Encounter: Payer: Self-pay | Admitting: Family Medicine

## 2015-09-09 VITALS — BP 111/68 | HR 70 | Temp 97.5°F | Ht 71.0 in | Wt 179.0 lb

## 2015-09-09 DIAGNOSIS — E559 Vitamin D deficiency, unspecified: Secondary | ICD-10-CM | POA: Diagnosis not present

## 2015-09-09 DIAGNOSIS — M545 Low back pain: Secondary | ICD-10-CM

## 2015-09-09 DIAGNOSIS — Z0000 Encounter for general adult medical examination without abnormal findings: Secondary | ICD-10-CM | POA: Diagnosis not present

## 2015-09-09 DIAGNOSIS — E119 Type 2 diabetes mellitus without complications: Secondary | ICD-10-CM | POA: Diagnosis not present

## 2015-09-09 DIAGNOSIS — E785 Hyperlipidemia, unspecified: Secondary | ICD-10-CM | POA: Diagnosis not present

## 2015-09-09 DIAGNOSIS — N400 Enlarged prostate without lower urinary tract symptoms: Secondary | ICD-10-CM | POA: Diagnosis not present

## 2015-09-09 LAB — POCT UA - MICROSCOPIC ONLY
BACTERIA, U MICROSCOPIC: NEGATIVE
CRYSTALS, UR, HPF, POC: NEGATIVE
Casts, Ur, LPF, POC: NEGATIVE
Mucus, UA: NEGATIVE
RBC, URINE, MICROSCOPIC: NEGATIVE
WBC, Ur, HPF, POC: NEGATIVE
Yeast, UA: NEGATIVE

## 2015-09-09 LAB — POCT URINALYSIS DIPSTICK
Bilirubin, UA: NEGATIVE
Blood, UA: NEGATIVE
GLUCOSE UA: 250
KETONES UA: NEGATIVE
Leukocytes, UA: NEGATIVE
Nitrite, UA: NEGATIVE
Protein, UA: NEGATIVE
Urobilinogen, UA: NEGATIVE
pH, UA: 6

## 2015-09-09 LAB — POCT UA - MICROALBUMIN: Microalbumin Ur, POC: 20 mg/L

## 2015-09-09 LAB — POCT GLYCOSYLATED HEMOGLOBIN (HGB A1C): HEMOGLOBIN A1C: 8.7

## 2015-09-09 MED ORDER — MELOXICAM 7.5 MG PO TABS: 7.5000 mg | ORAL_TABLET | Freq: Every day | ORAL | Status: DC

## 2015-09-09 MED ORDER — ICOSAPENT ETHYL 1 G PO CAPS: 2.0000 g | ORAL_CAPSULE | Freq: Two times a day (BID) | ORAL | Status: DC

## 2015-09-09 NOTE — Patient Instructions (Addendum)
Medicare Annual Wellness Visit  [facility] and the medical providers at [facility] strive to bring you the best medical care.  In doing so we not only want to address your current medical conditions and concerns but also to detect new conditions early and prevent illness, disease and health-related problems.    Medicare offers a yearly Wellness Visit which allows our clinical staff to assess your need for preventative services including immunizations, lifestyle education, counseling to decrease risk of preventable diseases and screening for fall risk and other medical concerns.    This visit is provided free of charge (no copay) for all Medicare recipients. The clinical pharmacists at [facility] have begun to conduct these Wellness Visits which will also include a thorough review of all your medications.    As you primary medical provider recommend that you make an appointment for your Annual Wellness Visit if you have not done so already this year.  You may set up this appointment before you leave today or you may call back ([phone]) and schedule an appointment.  Please make sure when you call that you mention that you are scheduling your Annual Wellness Visit with the clinical pharmacist so that the appointment may be made for the proper length of time.     Continue current medications. Continue good therapeutic lifestyle changes which include good diet and exercise. Fall precautions discussed with patient. If an FOBT was given today- please return it to our front desk. If you are over 60 years old - you may need Prevnar 59 or the adult Pneumonia vaccine.  **Flu shots will be available soon--- please call and schedule a FLU-CLINIC appointment**  After your visit with [place] today you will receive a survey in the mail or online from [employer] regarding your care with [place]. Please take a moment to fill this out. Your feedback is  very important to [place] as you can help [place] better understand your patient needs as well as improve your experience and satisfaction. WE CARE ABOUT YOU!!!   **Please join [place] [email or web address], 2016 from 5:00 to 7:00pm for our OPEN HOUSE! Come out and meet our NEW providers** Patient should take the anti-inflammatory medicine prescribed as directed. If it bothers his stomach he should stop taking it. If he is better in a couple weeks he should discontinue the use of this and take the rest only if needed. He should return to the clinic sometime before the end of October and get his flu shot. We will arrange in the future for a ETT. He should be careful with lifting and picking up heavy materials and try to do this more with his knees than with his back.

## 2015-09-09 NOTE — Progress Notes (Signed)
Subjective:    Patient ID: Peter Mathis, male    DOB: 12-23-1951, 64 y.o.   MRN: [id number]  HPI Patient is here today for annual wellness exam and follow up of chronic medical problems which includes hyperlipidemia and diabetes. He is taking medications regularly. The patient does complain of some low back pain radiating down the left side. This is been going on for about 1 month. He does not recall any specific injury. 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 He has not been as active for the past 3 or 4 weeks physically because of his back pain. It is worse in the morning and in the evening before he goes to bed and gets better during the day. The patient denies chest pain or shortness of breath. He has no GI complaints heartburn indigestion and nausea vomiting diarrhea or blood in the stool. He is due to get his colonoscopy in June 2017. He is passing his water okay without problems and having no sexual dysfunction problems. He will get lab work today as well as an FOBT. We will also get LS spine films.         Patient Active Problem List   Diagnosis Date Noted  . At risk for cardiac dysfunction 10/11/2014  . Right bundle branch block, history of 01/17/2014  . Hyperlipemia 07/12/2013  . Type II diabetes mellitus 07/12/2013  . BPH (benign prostatic hyperplasia) 07/12/2013   Outpatient Encounter Prescriptions as of 09/09/2015  Medication Sig  . aspirin (ASPIRIN LOW DOSE) 81 MG EC tablet Take 81 mg by mouth daily.    [name] atorvastatin (LIPITOR) 80 MG tablet TAKE 1 TABLET AT BEDTIME  . azelastine (ASTELIN) 137 MCG/SPRAY nasal spray 2 sprays each nostril at bedtime  . Cholecalciferol (VITAMIN D3) 5000 UNITS CAPS Take by mouth.    [name] glucose blood (ONE TOUCH ULTRA TEST) test strip Test bid, Dx 250.00  . JANUVIA 100 MG tablet Take 1 tablet (100 mg total) by mouth daily.  [name] lisinopril (PRINIVIL,ZESTRIL) 5 MG tablet TAKE 1 TABLET (5 MG TOTAL) BY MOUTH  DAILY.  . niacin (NIASPAN) 1000 MG CR tablet Take 1 tablet (1,000 mg total) by mouth daily.  . [DISCONTINUED] omega-3 acid ethyl esters (LOVAZA) 1 G capsule TAKE 2 CAPSULES TWICE DAILY  . [DISCONTINUED] omega-3 acid ethyl esters (LOVAZA) 1 G capsule TAKE 2 CAPSULES TWICE DAILY  . [DISCONTINUED] terbinafine (LAMISIL) 250 MG tablet Take 1 tablet (250 mg total) by mouth daily.   No facility-administered encounter medications on file as of 09/09/2015.     Review of Systems  Constitutional: Negative.   HENT: Negative.   Eyes: Negative.   Respiratory: Negative.   Cardiovascular: Negative.   Gastrointestinal: Negative.   Endocrine: Negative.   Genitourinary: Negative.   Musculoskeletal: Positive for back pain (lower back pain - radiates down left side).  Skin: Negative.   Allergic/Immunologic: Negative.   Neurological: Negative.   Hematological: Negative.   Psychiatric/Behavioral: Negative.        Objective:   Physical Exam  Constitutional: He is oriented to person, place, and time. He appears well-developed and well-nourished. No distress.  HENT:  Head: Normocephalic and atraumatic.  Right Ear: External ear normal.  Left Ear: External ear normal.  Nose: Nose normal.  Mouth/Throat: Oropharynx is clear and moist. No oropharyngeal exudate.  Eyes: Conjunctivae and EOM are normal. Pupils are equal, round, and reactive to light. Right eye exhibits no discharge. Left eye exhibits  no discharge. No scleral icterus.  Neck: Normal range of motion. Neck supple. No thyromegaly present.  No carotid bruits or anterior cervical adenopathy  Cardiovascular: Normal rate, normal heart sounds and intact distal pulses.   No murmur heard. The heart was slightly irregular at 72/m  Pulmonary/Chest: Effort normal and breath sounds normal. No respiratory distress. He has no wheezes. He has no rales. He exhibits no tenderness.  Clear anteriorly and posteriorly  Abdominal: Soft. Bowel sounds are normal. He  exhibits no mass. There is no tenderness. There is no rebound and no guarding.  Genitourinary: Rectum normal and penis normal.  Prostate was slightly enlarged but smooth. There are no rectal masses. There are no inguinal hernias palpable. The external genitalia were within normal limits.  Musculoskeletal: Normal range of motion. He exhibits no edema.  Lymphadenopathy:    He has no cervical adenopathy.  Neurological: He is alert and oriented to person, place, and time. He has normal reflexes. No cranial nerve deficit.  Skin: Skin is warm and dry. No rash noted. No erythema. No pallor.  Psychiatric: He has a normal mood and affect. His behavior is normal. Judgment and thought content normal.  Nursing note and vitals reviewed.   BP 111/68 mmHg  Pulse 70  Temp(Src) 97.5 F (36.4 C) (Oral)  Ht '5\' 11"'  (1.803 m)  Wt 179 lb (81.194 kg)  BMI 24.98 kg/m2  [facility] reading (PRIMARY) by  Dr. Bobbe Medico spine-  no obvious findings                                    Assessment & Plan:  1. Type 2 diabetes mellitus without complication -Patient will continue his current treatment and with as aggressive therapeutic lifestyle changes as possible pending results of his lab work. - POCT glycosylated hemoglobin (Hb A1C) - BMP8+EGFR - CBC with Differential/Platelet - POCT UA - Microalbumin - Microalbumin, urine  2. BPH (benign prostatic hyperplasia) -He is having no problems with voiding and the prostate is only slightly enlarged. - CBC with Differential/Platelet - PSA - POCT UA - Microscopic Only - POCT urinalysis dipstick  3. Hyperlipemia -He will continue with current treatment and will get JR Carson fish oil and take one twice daily if his prescription plan will not allow him to get the prescription fish oil. - Hepatic function panel - CBC with Differential/Platelet - NMR, lipoprofile  4. Vitamin D deficiency -Continue current treatment pending results of lab work - CBC with  Differential/Platelet - Vit D  25 hydroxy (rtn osteoporosis monitoring)  5. Annual physical exam -Return FOBT, colonoscopy due next June. - POCT glycosylated hemoglobin (Hb A1C) - BMP8+EGFR - Hepatic function panel - CBC with Differential/Platelet - NMR, lipoprofile - Thyroid Panel With TSH - Vit D  25 hydroxy (rtn osteoporosis monitoring) - PSA - POCT UA - Microalbumin - POCT UA - Microscopic Only - POCT urinalysis dipstick  6. Left low back pain, with sciatica presence unspecified -Take meloxicam 7.5 1 daily for a couple weeks after eating to see if this relieves the discomfort. -Avoid heavy lifting pushing or pulling - DG Lumbar Spine 2-3 Views; Future  Meds ordered this encounter  Medications  . meloxicam (MOBIC) 7.5 MG tablet    Sig: Take 1 tablet (7.5 mg total) by mouth daily.    Dispense:  30 tablet    Refill:  1  . Icosapent Ethyl 1 G CAPS  Sig: Take 2 g by mouth 2 (two) times daily.    Dispense:  120 capsule    Refill:  3   Patient Instructions                       Medicare Annual Wellness Visit  [facility] and the medical providers at [facility] strive to bring you the best medical care.  In doing so we not only want to address your current medical conditions and concerns but also to detect new conditions early and prevent illness, disease and health-related problems.    Medicare offers a yearly Wellness Visit which allows our clinical staff to assess your need for preventative services including immunizations, lifestyle education, counseling to decrease risk of preventable diseases and screening for fall risk and other medical concerns.    This visit is provided free of charge (no copay) for all Medicare recipients. The clinical pharmacists at [facility] have begun to conduct these Wellness Visits which will also include a thorough review of all your medications.    As you primary medical provider recommend that  you make an appointment for your Annual Wellness Visit if you have not done so already this year.  You may set up this appointment before you leave today or you may call back ([phone]) and schedule an appointment.  Please make sure when you call that you mention that you are scheduling your Annual Wellness Visit with the clinical pharmacist so that the appointment may be made for the proper length of time.     Continue current medications. Continue good therapeutic lifestyle changes which include good diet and exercise. Fall precautions discussed with patient. If an FOBT was given today- please return it to our front desk. If you are over 45 years old - you may need Prevnar 93 or the adult Pneumonia vaccine.  **Flu shots will be available soon--- please call and schedule a FLU-CLINIC appointment**  After your visit with [place] today you will receive a survey in the mail or online from [employer] regarding your care with [place]. Please take a moment to fill this out. Your feedback is very important to [place] as you can help [place] better understand your patient needs as well as improve your experience and satisfaction. WE CARE ABOUT YOU!!!   **Please join [place] SEPT.[email or web address], 2016 from 5:00 to 7:00pm for our OPEN HOUSE! Come out and meet our NEW providers** Patient should take the anti-inflammatory medicine prescribed as directed. If it bothers his stomach he should stop taking it. If he is better in a couple weeks he should discontinue the use of this and take the rest only if needed. He should return to the clinic sometime before the end of October and get his flu shot. We will arrange in the future for a ETT. He should be careful with lifting and picking up heavy materials and try to do this more with his knees than with his back.   Arrie Senate MD

## 2015-09-10 LAB — BMP8+EGFR
BUN/Creatinine Ratio: 17 (ref 10–22)
BUN: 13 mg/dL (ref 8–27)
CALCIUM: 9.5 mg/dL (ref 8.6–10.2)
CO2: 27 mmol/L (ref 18–29)
Chloride: 95 mmol/L — ABNORMAL LOW (ref 97–108)
Creatinine, Ser: 0.78 mg/dL (ref 0.76–1.27)
GFR calc Af Amer: 110 mL/min/{1.73_m2} (ref >59)
GFR calc non Af Amer: 95 mL/min/{1.73_m2} (ref >59)
GLUCOSE: 193 mg/dL — AB (ref 65–99)
Potassium: 5.1 mmol/L (ref 3.5–5.2)
Sodium: 135 mmol/L (ref 134–144)

## 2015-09-10 LAB — NMR, LIPOPROFILE
Cholesterol: 93 mg/dL — ABNORMAL LOW (ref 100–199)
HDL Cholesterol by NMR: 42 mg/dL (ref >39)
HDL PARTICLE NUMBER: 30 umol/L — AB (ref >=30.5)
LDL Particle Number: 436 nmol/L (ref <1000)
LDL Size: 21.3 nm (ref >20.5)
LDL-C: 34 mg/dL (ref 0–99)
LP-IR Score: 31 (ref <=45)
SMALL LDL PARTICLE NUMBER: 132 nmol/L (ref <=527)
TRIGLYCERIDES BY NMR: 85 mg/dL (ref 0–149)

## 2015-09-10 LAB — CBC WITH DIFFERENTIAL/PLATELET
BASOS: 0 %
Basophils Absolute: 0 10*3/uL (ref 0.0–0.2)
EOS (ABSOLUTE): 0.1 10*3/uL (ref 0.0–0.4)
EOS: 1 %
HEMATOCRIT: 46.3 % (ref 37.5–51.0)
Hemoglobin: 15.2 g/dL (ref 12.6–17.7)
IMMATURE GRANULOCYTES: 0 %
Immature Grans (Abs): 0 10*3/uL (ref 0.0–0.1)
Lymphocytes Absolute: 2.3 10*3/uL (ref 0.7–3.1)
Lymphs: 26 %
MCH: 29.2 pg (ref 26.6–33.0)
MCHC: 32.8 g/dL (ref 31.5–35.7)
MCV: 89 fL (ref 79–97)
MONOS ABS: 0.7 10*3/uL (ref 0.1–0.9)
Monocytes: 8 %
NEUTROS PCT: 65 %
Neutrophils Absolute: 5.7 10*3/uL (ref 1.4–7.0)
Platelets: 216 10*3/uL (ref 150–379)
RBC: 5.2 x10E6/uL (ref 4.14–5.80)
RDW: 13.3 % (ref 12.3–15.4)
WBC: 8.7 10*3/uL (ref 3.4–10.8)

## 2015-09-10 LAB — HEPATIC FUNCTION PANEL
ALBUMIN: 4.3 g/dL (ref 3.6–4.8)
ALK PHOS: 55 IU/L (ref 39–117)
ALT: 24 IU/L (ref 0–44)
AST: 17 IU/L (ref 0–40)
BILIRUBIN TOTAL: 0.6 mg/dL (ref 0.0–1.2)
Bilirubin, Direct: 0.18 mg/dL (ref 0.00–0.40)
TOTAL PROTEIN: 7 g/dL (ref 6.0–8.5)

## 2015-09-10 LAB — VITAMIN D 25 HYDROXY (VIT D DEFICIENCY, FRACTURES): Vit D, 25-Hydroxy: 51.7 ng/mL (ref 30.0–100.0)

## 2015-09-10 LAB — THYROID PANEL WITH TSH
Free Thyroxine Index: 2.1 (ref 1.2–4.9)
T3 Uptake Ratio: 31 % (ref 24–39)
T4, Total: 6.8 ug/dL (ref 4.5–12.0)
TSH: 1.44 u[IU]/mL (ref 0.450–4.500)

## 2015-09-10 LAB — PSA: Prostate Specific Ag, Serum: 0.9 ng/mL (ref 0.0–4.0)

## 2015-09-10 LAB — MICROALBUMIN, URINE: Microalbumin, Urine: <3 ug/mL

## 2015-09-20 ENCOUNTER — Other Ambulatory Visit: Payer: Self-pay | Admitting: Family Medicine

## 2015-10-04 ENCOUNTER — Other Ambulatory Visit: Payer: Self-pay | Admitting: Family Medicine

## 2015-10-14 ENCOUNTER — Other Ambulatory Visit: Payer: Self-pay | Admitting: *Deleted

## 2015-10-14 ENCOUNTER — Encounter: Payer: Self-pay | Admitting: Family Medicine

## 2015-10-14 MED ORDER — JANUVIA 100 MG PO TABS: 100.0000 mg | ORAL_TABLET | Freq: Every day | ORAL | Status: DC

## 2015-10-29 DIAGNOSIS — H2513 Age-related nuclear cataract, bilateral: Secondary | ICD-10-CM | POA: Diagnosis not present

## 2015-10-29 DIAGNOSIS — H524 Presbyopia: Secondary | ICD-10-CM | POA: Diagnosis not present

## 2015-10-29 DIAGNOSIS — Z135 Encounter for screening for eye and ear disorders: Secondary | ICD-10-CM | POA: Diagnosis not present

## 2015-10-29 LAB — HM DIABETES EYE EXAM

## 2015-12-05 ENCOUNTER — Encounter: Payer: Self-pay | Admitting: *Deleted

## 2015-12-25 ENCOUNTER — Encounter: Payer: Self-pay | Admitting: Family Medicine

## 2015-12-26 MED ORDER — NIACIN ER (ANTIHYPERLIPIDEMIC) 1000 MG PO TBCR: EXTENDED_RELEASE_TABLET | ORAL | Status: DC

## 2015-12-26 MED ORDER — ICOSAPENT ETHYL 1 G PO CAPS: 2.0000 g | ORAL_CAPSULE | Freq: Two times a day (BID) | ORAL | Status: DC

## 2015-12-26 MED ORDER — SITAGLIPTIN PHOSPHATE 100 MG PO TABS: 100.0000 mg | ORAL_TABLET | Freq: Every day | ORAL | Status: DC

## 2015-12-31 ENCOUNTER — Encounter: Payer: Self-pay | Admitting: Family Medicine

## 2016-01-07 ENCOUNTER — Other Ambulatory Visit: Payer: Self-pay | Admitting: Family Medicine

## 2016-01-08 ENCOUNTER — Encounter: Payer: Self-pay | Admitting: Family Medicine

## 2016-01-09 MED ORDER — LISINOPRIL 5 MG PO TABS: ORAL_TABLET | ORAL | Status: DC

## 2016-01-23 ENCOUNTER — Ambulatory Visit (INDEPENDENT_AMBULATORY_CARE_PROVIDER_SITE_OTHER): Payer: PPO

## 2016-01-23 ENCOUNTER — Encounter: Payer: Self-pay | Admitting: Family Medicine

## 2016-01-23 ENCOUNTER — Ambulatory Visit (INDEPENDENT_AMBULATORY_CARE_PROVIDER_SITE_OTHER): Payer: PPO | Admitting: Family Medicine

## 2016-01-23 VITALS — BP 130/76 | HR 75 | Temp 97.5°F | Ht 71.0 in | Wt 177.0 lb

## 2016-01-23 DIAGNOSIS — R1084 Generalized abdominal pain: Secondary | ICD-10-CM | POA: Diagnosis not present

## 2016-01-23 DIAGNOSIS — Z0000 Encounter for general adult medical examination without abnormal findings: Secondary | ICD-10-CM

## 2016-01-23 DIAGNOSIS — J309 Allergic rhinitis, unspecified: Secondary | ICD-10-CM | POA: Diagnosis not present

## 2016-01-23 DIAGNOSIS — N400 Enlarged prostate without lower urinary tract symptoms: Secondary | ICD-10-CM | POA: Diagnosis not present

## 2016-01-23 DIAGNOSIS — E785 Hyperlipidemia, unspecified: Secondary | ICD-10-CM

## 2016-01-23 DIAGNOSIS — E119 Type 2 diabetes mellitus without complications: Secondary | ICD-10-CM

## 2016-01-23 DIAGNOSIS — R143 Flatulence: Secondary | ICD-10-CM | POA: Diagnosis not present

## 2016-01-23 DIAGNOSIS — N5201 Erectile dysfunction due to arterial insufficiency: Secondary | ICD-10-CM | POA: Diagnosis not present

## 2016-01-23 DIAGNOSIS — E559 Vitamin D deficiency, unspecified: Secondary | ICD-10-CM

## 2016-01-23 DIAGNOSIS — Z1212 Encounter for screening for malignant neoplasm of rectum: Secondary | ICD-10-CM | POA: Diagnosis not present

## 2016-01-23 DIAGNOSIS — L209 Atopic dermatitis, unspecified: Secondary | ICD-10-CM

## 2016-01-23 LAB — POCT GLYCOSYLATED HEMOGLOBIN (HGB A1C): Hemoglobin A1C: 8.8

## 2016-01-23 MED ORDER — METFORMIN HCL 500 MG PO TABS: 500.0000 mg | ORAL_TABLET | Freq: Every day | ORAL | Status: DC

## 2016-01-23 MED ORDER — GLUCOSE BLOOD VI STRP: ORAL_STRIP | Status: DC

## 2016-01-23 MED ORDER — FLUTICASONE PROPIONATE 50 MCG/ACT NA SUSP: 2.0000 | Freq: Every day | NASAL | Status: DC

## 2016-01-23 MED ORDER — AZELASTINE HCL 0.1 % NA SOLN: NASAL | Status: DC

## 2016-01-23 NOTE — Addendum Note (Signed)
Addended by: Wyline Mood on: 01/23/2016 03:23 PM   Modules accepted: Orders, SmartSet

## 2016-01-23 NOTE — Patient Instructions (Addendum)
Medicare Annual Wellness Visit  [facility] and the medical providers at [facility] strive to bring you the best medical care.  In doing so we not only want to address your current medical conditions and concerns but also to detect new conditions early and prevent illness, disease and health-related problems.    Medicare offers a yearly Wellness Visit which allows our clinical staff to assess your need for preventative services including immunizations, lifestyle education, counseling to decrease risk of preventable diseases and screening for fall risk and other medical concerns.    This visit is provided free of charge (no copay) for all Medicare recipients. The clinical pharmacists at [facility] have begun to conduct these Wellness Visits which will also include a thorough review of all your medications.    As you primary medical provider recommend that you make an appointment for your Annual Wellness Visit if you have not done so already this year.  You may set up this appointment before you leave today or you may call back [phone] and schedule an appointment.  Please make sure when you call that you mention that you are scheduling your Annual Wellness Visit with the clinical pharmacist so that the appointment may be made for the proper length of time.     Continue current medications. Continue good therapeutic lifestyle changes which include good diet and exercise. Fall precautions discussed with patient. If an FOBT was given today- please return it to our front desk. If you are over 67 years old - you may need Prevnar 24 or the adult Pneumonia vaccine.  **Flu shots are available--- please call and schedule a FLU-CLINIC appointment**  After your visit with [place] today you will receive a survey in the mail or online from [employer] regarding your care with [place]. Please take a moment to fill this out. Your feedback is very  important to [place] as you can help [place] better understand your patient needs as well as improve your experience and satisfaction. WE CARE ABOUT YOU!!!   Discontinue Niaspan Reduce the use of Astelin Start Flonase 1-2 sprays each nostril at bedtime Use nasal saline frequently during the day and use cool mist humidification in the house at nighttime, keep the house as cool as possible Monitor blood sugars closely over the next couple weeks and return to the office with these readings an appointment with the clinical pharmacists who is a certified diabetic educator so that we can get the blood sugars under better control Continue to work with diet and exercise aggressively The patient should avoid fabric softeners detergents and soaps that have scent

## 2016-01-23 NOTE — Progress Notes (Signed)
Subjective:    Patient ID: Peter Mathis, male    DOB: [birth year], 65 y.o.   MRN: [id number]  HPI Pt here for follow up and management of chronic medical problems which includes hyperlipidemia and diabetes. He is taking medications regularly. Patient has no specific complaints other than he does say his blood sugar has been elevated. He also complains of a rash off and on. He is currently still taking Niaspan. He is also taking Januvia. Since some people do have elevated blood sugars with Niaspan will probably discontinue this today. He is requesting a refill on his strips for checking his blood sugar and his Astelin nasal spray. The patient denies chest pain shortness of breath trouble swallowing heartburn nausea vomiting diarrhea or blood in the stool. He does have some occasional abdominal pain which he attributes to gas and bloating. He is passing his water without problems. He still has some erectile dysfunction issues.     Patient Active Problem List   Diagnosis Date Noted  . At risk for cardiac dysfunction 10/11/2014  . Right bundle branch block, history of 01/17/2014  . Hyperlipemia 07/12/2013  . Type II diabetes mellitus ([facility]) 07/12/2013  . BPH (benign prostatic hyperplasia) 07/12/2013   Outpatient Encounter Prescriptions as of 01/23/2016  Medication Sig  . aspirin (ASPIRIN LOW DOSE) 81 MG EC tablet Take 81 mg by mouth daily.    [name] atorvastatin (LIPITOR) 80 MG tablet TAKE 1 TABLET AT BEDTIME  . azelastine (ASTELIN) 137 MCG/SPRAY nasal spray 2 sprays each nostril at bedtime  . Cholecalciferol (VITAMIN D3) 5000 UNITS CAPS Take by mouth.    [name] glucose blood (ONE TOUCH ULTRA TEST) test strip Test bid, Dx 250.00  . Icosapent Ethyl 1 g CAPS Take 2 g by mouth 2 (two) times daily.  [name] lisinopril (PRINIVIL,ZESTRIL) 5 MG tablet TAKE 1 TABLET (5 MG TOTAL) BY MOUTH DAILY.  . niacin (NIASPAN) 1000 MG CR tablet TAKE 1 TABLET (1,000 MG TOTAL) BY MOUTH DAILY.  . sitaGLIPtin (JANUVIA) 100 MG  tablet Take 1 tablet (100 mg total) by mouth daily.  . [DISCONTINUED] meloxicam (MOBIC) 7.5 MG tablet Take 1 tablet (7.5 mg total) by mouth daily.   No facility-administered encounter medications on file as of 01/23/2016.      Review of Systems  Constitutional: Negative.   HENT: Negative.   Eyes: Negative.   Respiratory: Negative.   Cardiovascular: Negative.   Gastrointestinal: Negative.   Endocrine: Negative.        Elevated BS  Genitourinary: Negative.   Musculoskeletal: Negative.   Skin: Positive for rash (off / on).  Allergic/Immunologic: Negative.   Neurological: Negative.   Hematological: Negative.   Psychiatric/Behavioral: Negative.        Objective:   Physical Exam  Constitutional: He is oriented to person, place, and time. He appears well-developed and well-nourished. No distress.  HENT:  Head: Normocephalic and atraumatic.  Right Ear: External ear normal.  Left Ear: External ear normal.  Mouth/Throat: Oropharynx is clear and moist. No oropharyngeal exudate.  Slight nasal congestion bilaterally  Eyes: Conjunctivae and EOM are normal. Pupils are equal, round, and reactive to light. Right eye exhibits no discharge. Left eye exhibits no discharge. No scleral icterus.  Patient gets his eyes checked yearly and the fall  Neck: Normal range of motion. Neck supple. No thyromegaly present.  Cardiovascular: Normal rate, regular rhythm, normal heart sounds and intact distal pulses.   No murmur heard. The heart has a regular rate and rhythm at  84/m  Pulmonary/Chest: Effort normal and breath sounds normal. No respiratory distress. He has no wheezes. He has no rales. He exhibits no tenderness.  Clear anteriorly and posteriorly and no axillary adenopathy  Abdominal: Soft. Bowel sounds are normal. He exhibits no mass. There is no tenderness. There is no rebound and no guarding.  There was no abdominal tenderness or organ enlargement or masses. There were no abdominal bruits.    Musculoskeletal: Normal range of motion. He exhibits no edema or tenderness.  Lymphadenopathy:    He has no cervical adenopathy.  Neurological: He is alert and oriented to person, place, and time. He has normal reflexes. No cranial nerve deficit.  Skin: Skin is warm and dry. Rash noted.  There are some areas of rash on the lower extremity the patient showed me which appear to be more like an atopic dermatitis.  Psychiatric: He has a normal mood and affect. His behavior is normal. Judgment and thought content normal.  Nursing note and vitals reviewed.  BP 130/76 mmHg  Pulse 75  Temp(Src) 97.5 F (36.4 C) (Oral)  Ht '5\' 11"'  (1.803 m)  Wt 177 lb (80.287 kg)  BMI 24.70 kg/m2        Assessment & Plan:  1. BPH (benign prostatic hyperplasia) -The patient is having no complaints with this today and this exam will be due at the next visit - CBC with Differential/Platelet  2. Type 2 diabetes mellitus without complication, without long-term current use of insulin (HCC) -Patient's blood sugars have not been under good control at home and he is concerned about this. He is only taking Januvia 100 mg daily. His A1c today was 8.8%. We're adding metformin 500 mg to his treatment regimen and he will bring blood sugars back for review to meet with the diabetic educator in a couple weeks to further work on getting the blood sugar under better control. - POCT glycosylated hemoglobin (Hb A1C) - BMP8+EGFR - CBC with Differential/Platelet - DG Chest 2 View; Future  3. Hyperlipemia -Continue aggressive therapeutic lifestyle changes and discontinue Niaspan as this may raise blood sugar levels. - CBC with Differential/Platelet - Hepatic function panel - NMR, lipoprofile - DG Chest 2 View; Future  4. Vitamin D deficiency -Continue current treatment pending results of lab work - CBC with Differential/Platelet - VITAMIN D 25 Hydroxy (Vit-D Deficiency, Fractures)  5. Allergic rhinitis, unspecified  allergic rhinitis type -Reduce the use of sedating antihistamines and go with one sitter less sedating like Claritin or Allegra - azelastine (ASTELIN) 0.1 % nasal spray; 2 sprays each nostril at bedtime  Dispense: 90 mL; Refill: 3  6. Health care maintenance -FOBT given today - DG Chest 2 View; Future  7. Flatulence - US Abdomen Complete; Future  8. Generalized abdominal pain - US Abdomen Complete; Future  9. Erectile dysfunction due to arterial insufficiency -Cialis 20 as needed  10. Atopic dermatitis -Avoid fabric softener soaps and detergents that are scented  Meds ordered this encounter  Medications  . glucose blood (ONE TOUCH ULTRA TEST) test strip    Sig: Test bid, Dx E11.9    Dispense:  300 each    Refill:  3  . azelastine (ASTELIN) 0.1 % nasal spray    Sig: 2 sprays each nostril at bedtime    Dispense:  90 mL    Refill:  3  . fluticasone (FLONASE) 50 MCG/ACT nasal spray    Sig: Place 2 sprays into both nostrils daily.    Dispense:  48 g  Refill:  3  . metFORMIN (GLUCOPHAGE) 500 MG tablet    Sig: Take 1 tablet (500 mg total) by mouth daily.    Dispense:  90 tablet    Refill:  3   Patient Instructions                       Medicare Annual Wellness Visit  [facility] and the medical providers at [facility] strive to bring you the best medical care.  In doing so we not only want to address your current medical conditions and concerns but also to detect new conditions early and prevent illness, disease and health-related problems.    Medicare offers a yearly Wellness Visit which allows our clinical staff to assess your need for preventative services including immunizations, lifestyle education, counseling to decrease risk of preventable diseases and screening for fall risk and other medical concerns.    This visit is provided free of charge (no copay) for all Medicare recipients. The clinical pharmacists at [facility]  have begun to conduct these Wellness Visits which will also include a thorough review of all your medications.    As you primary medical provider recommend that you make an appointment for your Annual Wellness Visit if you have not done so already this year.  You may set up this appointment before you leave today or you may call back [phone]) and schedule an appointment.  Please make sure when you call that you mention that you are scheduling your Annual Wellness Visit with the clinical pharmacist so that the appointment may be made for the proper length of time.     Continue current medications. Continue good therapeutic lifestyle changes which include good diet and exercise. Fall precautions discussed with patient. If an FOBT was given today- please return it to our front desk. If you are over 60 years old - you may need Prevnar 33 or the adult Pneumonia vaccine.  **Flu shots are available--- please call and schedule a FLU-CLINIC appointment**  After your visit with [place] today you will receive a survey in the mail or online from [employer] regarding your care with [place]. Please take a moment to fill this out. Your feedback is very important to [place] as you can help [place] better understand your patient needs as well as improve your experience and satisfaction. WE CARE ABOUT YOU!!!   Discontinue Niaspan Reduce the use of Astelin Start Flonase 1-2 sprays each nostril at bedtime Use nasal saline frequently during the day and use cool mist humidification in the house at nighttime, keep the house as cool as possible Monitor blood sugars closely over the next couple weeks and return to the office with these readings an appointment with the clinical pharmacists who is a certified diabetic educator so that we can get the blood sugars under better control Continue to work with diet and exercise aggressively The patient should avoid fabric softeners detergents and soaps that have scent   Arrie Senate MD

## 2016-01-24 LAB — HEPATIC FUNCTION PANEL
ALBUMIN: 4.2 g/dL (ref 3.6–4.8)
ALK PHOS: 56 IU/L (ref 39–117)
ALT: 25 IU/L (ref 0–44)
AST: 17 IU/L (ref 0–40)
BILIRUBIN TOTAL: 0.9 mg/dL (ref 0.0–1.2)
Bilirubin, Direct: 0.24 mg/dL (ref 0.00–0.40)
Total Protein: 6.8 g/dL (ref 6.0–8.5)

## 2016-01-24 LAB — CBC WITH DIFFERENTIAL/PLATELET
BASOS: 0 %
Basophils Absolute: 0 10*3/uL (ref 0.0–0.2)
EOS (ABSOLUTE): 0.1 10*3/uL (ref 0.0–0.4)
EOS: 1 %
HEMATOCRIT: 44.8 % (ref 37.5–51.0)
HEMOGLOBIN: 15.3 g/dL (ref 12.6–17.7)
IMMATURE GRANULOCYTES: 0 %
Immature Grans (Abs): 0 10*3/uL (ref 0.0–0.1)
LYMPHS ABS: 2.2 10*3/uL (ref 0.7–3.1)
Lymphs: 28 %
MCH: 29.4 pg (ref 26.6–33.0)
MCHC: 34.2 g/dL (ref 31.5–35.7)
MCV: 86 fL (ref 79–97)
MONOS ABS: 0.6 10*3/uL (ref 0.1–0.9)
Monocytes: 7 %
NEUTROS PCT: 64 %
Neutrophils Absolute: 5.1 10*3/uL (ref 1.4–7.0)
Platelets: 233 10*3/uL (ref 150–379)
RBC: 5.2 x10E6/uL (ref 4.14–5.80)
RDW: 13.4 % (ref 12.3–15.4)
WBC: 7.9 10*3/uL (ref 3.4–10.8)

## 2016-01-24 LAB — NMR, LIPOPROFILE
Cholesterol: 110 mg/dL (ref 100–199)
HDL Cholesterol by NMR: 41 mg/dL (ref >39)
HDL PARTICLE NUMBER: 32 umol/L (ref >=30.5)
LDL Particle Number: 549 nmol/L (ref <1000)
LDL SIZE: 19.7 nm (ref >20.5)
LDL-C: 54 mg/dL (ref 0–99)
LP-IR SCORE: 44 (ref <=45)
SMALL LDL PARTICLE NUMBER: 354 nmol/L (ref <=527)
Triglycerides by NMR: 76 mg/dL (ref 0–149)

## 2016-01-24 LAB — BMP8+EGFR
BUN / CREAT RATIO: 21 (ref 10–22)
BUN: 17 mg/dL (ref 8–27)
CO2: 24 mmol/L (ref 18–29)
CREATININE: 0.82 mg/dL (ref 0.76–1.27)
Calcium: 9.3 mg/dL (ref 8.6–10.2)
Chloride: 95 mmol/L — ABNORMAL LOW (ref 96–106)
GFR calc Af Amer: 107 mL/min/{1.73_m2} (ref >59)
GFR, EST NON AFRICAN AMERICAN: 93 mL/min/{1.73_m2} (ref >59)
GLUCOSE: 198 mg/dL — AB (ref 65–99)
Potassium: 4.7 mmol/L (ref 3.5–5.2)
SODIUM: 133 mmol/L — AB (ref 134–144)

## 2016-01-24 LAB — VITAMIN D 25 HYDROXY (VIT D DEFICIENCY, FRACTURES): Vit D, 25-Hydroxy: 44.7 ng/mL (ref 30.0–100.0)

## 2016-01-25 LAB — FECAL OCCULT BLOOD, IMMUNOCHEMICAL: FECAL OCCULT BLD: NEGATIVE

## 2016-01-27 ENCOUNTER — Telehealth: Payer: Self-pay | Admitting: Family Medicine

## 2016-01-27 NOTE — Telephone Encounter (Signed)
Stp and advised once the initial appt has been scheduled the pt can call back and schedule at a time convenient for him. Pt voiced understanding.

## 2016-01-29 ENCOUNTER — Other Ambulatory Visit (HOSPITAL_COMMUNITY): Payer: Self-pay

## 2016-02-03 ENCOUNTER — Ambulatory Visit (HOSPITAL_COMMUNITY)
Admission: RE | Admit: 2016-02-03 | Discharge: 2016-02-03 | Disposition: A | Discharge status: Home / Self Care | Payer: PPO | Source: Ambulatory Visit | Attending: Family Medicine | Admitting: Family Medicine

## 2016-02-03 DIAGNOSIS — R143 Flatulence: Secondary | ICD-10-CM | POA: Diagnosis not present

## 2016-02-03 DIAGNOSIS — R109 Unspecified abdominal pain: Secondary | ICD-10-CM | POA: Diagnosis not present

## 2016-02-03 DIAGNOSIS — R1084 Generalized abdominal pain: Secondary | ICD-10-CM | POA: Insufficient documentation

## 2016-02-07 ENCOUNTER — Encounter: Payer: Self-pay | Admitting: Pharmacist

## 2016-02-07 ENCOUNTER — Ambulatory Visit (INDEPENDENT_AMBULATORY_CARE_PROVIDER_SITE_OTHER): Payer: PPO | Admitting: Pharmacist

## 2016-02-07 VITALS — BP 132/70 | HR 75 | Ht 71.0 in | Wt 180.0 lb

## 2016-02-07 DIAGNOSIS — E785 Hyperlipidemia, unspecified: Secondary | ICD-10-CM

## 2016-02-07 DIAGNOSIS — E119 Type 2 diabetes mellitus without complications: Secondary | ICD-10-CM

## 2016-02-07 MED ORDER — OMEGA 3 1000 MG PO CAPS: 2.0000 | ORAL_CAPSULE | Freq: Two times a day (BID) | ORAL | Status: DC

## 2016-02-07 MED ORDER — ATORVASTATIN CALCIUM 80 MG PO TABS: 40.0000 mg | ORAL_TABLET | Freq: Every day | ORAL | Status: DC

## 2016-02-07 NOTE — Patient Instructions (Signed)
Diabetes and Standards of Medical Care   Diabetes is complicated. You may find that your diabetes team includes a dietitian, nurse, diabetes educator, eye doctor, and more. To help everyone know what is going on and to help you get the care you deserve, the following schedule of care was developed to help keep you on track. Below are the tests, exams, vaccines, medicines, education, and plans you will need.  Blood Glucose Goals Prior to meals = 80 - 130 Within 2 hours of the start of a meal = less than 180  HbA1c test (goal is less than 7.0% - your last value was 8.8%) This test shows how well you have controlled your glucose over the past 2 to 3 months. It is used to see if your diabetes management plan needs to be adjusted.   It is performed at least 2 times a year if you are meeting treatment goals.  It is performed 4 times a year if therapy has changed or if you are not meeting treatment goals.  Blood pressure test  This test is performed at every routine medical visit. The goal is less than 140/90 mmHg for most people, but 130/80 mmHg in some cases. Ask your health care provider about your goal.  Dental exam  Follow up with the dentist regularly.  Eye exam  If you are diagnosed with type 1 diabetes as a child, get an exam upon reaching the age of 64 years or older and have had diabetes for 3 to 5 years. Yearly eye exams are recommended after that initial eye exam.  If you are diagnosed with type 1 diabetes as an adult, get an exam within 5 years of diagnosis and then yearly.  If you are diagnosed with type 2 diabetes, get an exam as soon as possible after the diagnosis and then yearly.  Foot care exam  Visual foot exams are performed at every routine medical visit. The exams check for cuts, injuries, or other problems with the feet.  A comprehensive foot exam should be done yearly. This includes visual inspection as well as assessing foot pulses and testing for loss of  sensation.  Check your feet nightly for cuts, injuries, or other problems with your feet. Tell your health care provider if anything is not healing.  Kidney function test (urine microalbumin)  This test is performed once a year.  Type 1 diabetes: The first test is performed 5 years after diagnosis.  Type 2 diabetes: The first test is performed at the time of diagnosis.  A serum creatinine and estimated glomerular filtration rate (eGFR) test is done once a year to assess the level of chronic kidney disease (CKD), if present.  Lipid profile (cholesterol, HDL, LDL, triglycerides)  Performed every 5 years for most people.  The goal for LDL is less than 100 mg/dL. If you are at high risk, the goal is less than 70 mg/dL.  The goal for HDL is 40 mg/dL to 50 mg/dL for men and 50 mg/dL to 60 mg/dL for women. An HDL cholesterol of 60 mg/dL or higher gives some protection against heart disease.  The goal for triglycerides is less than 150 mg/dL.  Influenza vaccine, pneumococcal vaccine, and hepatitis B vaccine  The influenza vaccine is recommended yearly.  The pneumococcal vaccine is generally given once in a lifetime. However, there are some instances when another vaccination is recommended. Check with your health care provider.  The hepatitis B vaccine is also recommended for adults with diabetes.  Diabetes self-management education  Education is recommended at diagnosis and ongoing as needed.  Treatment plan  Your treatment plan is reviewed at every medical visit.  Document Released: 10/11/2009 Document Revised: 08/16/2013 Document Reviewed: 05/16/2013 [facility] Patient Information 2014 [place].

## 2016-02-07 NOTE — Progress Notes (Signed)
Subjective:    Peter Mathis is a 65 y.o. male who presents for an initial evaluation of Type 2 diabetes mellitus.  Current symptoms/problems include hyperglycemia and have been worsening. Symptoms have been present for 3 months.  Patient was initially diagnosed with type 2 DM 9 to 10 years ago.   Known diabetic complications: none Cardiovascular risk factors: advanced age (older than 43 for men, 22 for women), diabetes mellitus, dyslipidemia, hypertension and male gender Current diabetic medications include Januvia 100mg  once daily and metformin 500mg  1 tablet once daily.  Metformin  was started 1 week ago.  Patient also has recently stopped niasin  Eye exam current (within one year): yes Weight trend: stable Prior visit with dietician: yes - but was when first diagnosed Current diet: in general, an "unhealthy" diet Current exercise: walking - just restarted walking 2 miles 3 days per week   Current monitoring regimen: home blood tests - 1-2 times daily Home blood sugar records: prior to starting metformin avg was about 220 but since starting metformin is now in 180's Any episodes of hypoglycemia? no  Is He on ACE inhibitor or angiotensin II receptor blocker?  Yes  lisinopril (Prinivil)    The following portions of the patient's history were reviewed and updated as appropriate: allergies, current medications, past family history and problem list.  Review of Systems   Objective:    BP 132/70 mmHg  Pulse 75  Ht 5\' 11"  (1.803 m)  Wt 180 lb (81.647 kg)  BMI 25.12 kg/m2    Date A1c  01/22/2106 8.8  09/09/2015 8.7  05/02/2015 8.2    Lab Review GLUCOSE (mg/dL)  Date Value  01/23/2016 198*  09/09/2015 193*  05/02/2015 168*   GLUCOSE, BLD (mg/dL)  Date Value  07/06/2013 139*   CO2 (mmol/L)  Date Value  01/23/2016 24  09/09/2015 27  05/02/2015 27   BUN (mg/dL)  Date Value  01/23/2016 17  09/09/2015 13  05/02/2015 16  07/06/2013 20  02/25/2009 9   CREAT  (mg/dL)  Date Value  07/06/2013 0.90   CREATININE, SER (mg/dL)  Date Value  01/23/2016 0.82  09/09/2015 0.78  05/02/2015 0.96    Assessment:    Diabetes Mellitus type II, under inadequate control.   Hyperlipidemia - patient asks if her can take OTC Fish Oil as cost of Vascepa has increased to over $80.  Tg were at goal when last checked.  Medication management  Plan:    1.  Rx changes:  none - continue Januvia 100mg  daily and metfromin 500mg  daily   Continue with atrovastatin 80mg  1/2 tablet daily - changed to reflect how patient is actuallly taking  Continue off niaspan  OK to change to OTC Fish oil as patients Tg were 76 at last check and cost of Vascepa is over $80. 2.  Education: Reviewed 'ABCs' of diabetes management (respective goals in parentheses):  A1C (<7), blood pressure (<130/80), and cholesterol (LDL <100). 3. CHO counting diet was reviewed in depth.  Disucssed recommended serving sizes and CHO goals of meals (45 to 50 grams or less) and snacks (15 to 20 grams or less) 4.  Continue with exercise - goal is 150 minutes per week 5.   Follow up: as planned with PCP in May 2017; Patient is to call me if BG does not continue to improve to goals of 80 to 130 fasting or less than 180 after meals.  Cherre Robins, PharmD, CPP, CDE

## 2016-04-07 ENCOUNTER — Encounter: Payer: Self-pay | Admitting: Family Medicine

## 2016-04-08 MED ORDER — SITAGLIPTIN PHOSPHATE 100 MG PO TABS
100.0000 mg | ORAL_TABLET | Freq: Every day | ORAL | Status: DC
Start: 2016-04-08 — End: 2016-07-18

## 2016-04-08 MED ORDER — LISINOPRIL 5 MG PO TABS: ORAL_TABLET | ORAL | Status: DC

## 2016-04-30 ENCOUNTER — Ambulatory Visit: Payer: PPO | Admitting: Family Medicine

## 2016-05-05 ENCOUNTER — Ambulatory Visit (INDEPENDENT_AMBULATORY_CARE_PROVIDER_SITE_OTHER): Payer: PPO

## 2016-05-05 ENCOUNTER — Ambulatory Visit (INDEPENDENT_AMBULATORY_CARE_PROVIDER_SITE_OTHER): Payer: PPO | Admitting: Family Medicine

## 2016-05-05 ENCOUNTER — Encounter: Payer: Self-pay | Admitting: Family Medicine

## 2016-05-05 VITALS — BP 169/79 | HR 62 | Temp 97.0°F | Ht 71.0 in | Wt 183.0 lb

## 2016-05-05 DIAGNOSIS — Z1211 Encounter for screening for malignant neoplasm of colon: Secondary | ICD-10-CM

## 2016-05-05 DIAGNOSIS — E119 Type 2 diabetes mellitus without complications: Secondary | ICD-10-CM

## 2016-05-05 DIAGNOSIS — E785 Hyperlipidemia, unspecified: Secondary | ICD-10-CM | POA: Diagnosis not present

## 2016-05-05 DIAGNOSIS — Z23 Encounter for immunization: Secondary | ICD-10-CM | POA: Diagnosis not present

## 2016-05-05 DIAGNOSIS — E559 Vitamin D deficiency, unspecified: Secondary | ICD-10-CM

## 2016-05-05 DIAGNOSIS — N400 Enlarged prostate without lower urinary tract symptoms: Secondary | ICD-10-CM

## 2016-05-05 DIAGNOSIS — Z0000 Encounter for general adult medical examination without abnormal findings: Secondary | ICD-10-CM

## 2016-05-05 DIAGNOSIS — M545 Low back pain: Secondary | ICD-10-CM | POA: Diagnosis not present

## 2016-05-05 NOTE — Addendum Note (Signed)
Addended by: Zannie Cove on: 05/05/2016 12:38 PM   Modules accepted: Miquel Dunn

## 2016-05-05 NOTE — Patient Instructions (Addendum)
Medicare Annual Wellness Visit  [place] and the medical providers at [facility] strive to bring you the best medical care.  In doing so we not only want to address your current medical conditions and concerns but also to detect new conditions early and prevent illness, disease and health-related problems.    Medicare offers a yearly Wellness Visit which allows our clinical staff to assess your need for preventative services including immunizations, lifestyle education, counseling to decrease risk of preventable diseases and screening for fall risk and other medical concerns.    This visit is provided free of charge (no copay) for all Medicare recipients. The clinical pharmacists at [facility] have begun to conduct these Wellness Visits which will also include a thorough review of all your medications.    As you primary medical provider recommend that you make an appointment for your Annual Wellness Visit if you have not done so already this year.  You may set up this appointment before you leave today or you may call back [phone] and schedule an appointment.  Please make sure when you call that you mention that you are scheduling your Annual Wellness Visit with the clinical pharmacist so that the appointment may be made for the proper length of time.     Continue current medications. Continue good therapeutic lifestyle changes which include good diet and exercise. Fall precautions discussed with patient. If an FOBT was given today- please return it to our front desk. If you are over 47 years old - you may need Prevnar 38 or the adult Pneumonia vaccine.  **Flu shots are available--- please call and schedule a FLU-CLINIC appointment**  After your visit with [place] today you will receive a survey in the mail or online from [employer] regarding your care with [place]. Please take a moment to fill this out. Your feedback is very  important to [place] as you can help [place] better understand your patient needs as well as improve your experience and satisfaction. WE CARE ABOUT YOU!!!   Continue to work on exercise and diet Continue to watch sodium intake Purchase an Omron low pressure monitor and record some blood pressure readings and bring these to the next visit We will arrange for you to have a repeat colonoscopy as it has been almost 10 years since the last one. We will also arrange for you to have an exercise treadmill test by one of our providers

## 2016-05-05 NOTE — Progress Notes (Signed)
Subjective:    Patient ID: Peter Mathis, male    DOB: 1951/07/07, 65 y.o.   MRN: [id number]  HPI Pt here for follow up and management of chronic medical problems which includes diabetes. He is taking medications regularly.The patient is doing well today with no specific complaints. His weight is up 3 pounds since the last visit. His last hemoglobin A1c was 8.7% and this is unusual for him for not to be under better control. Were hoping that the reading today will be improved. He has seen the clinical pharmacists. His Niaspan has been stopped. He is now on metformin in addition to Januvia. He says he is feeling better with the additional medication. He indicates he has not been able to exercise quite as much as he would like. 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 He denies any chest pain or shortness of breath. He has not had a stress test and for 5 years and we will schedule one here in the office. He denies any trouble with his stomach including abdominal pain and heartburn indigestion nausea vomiting diarrhea blood in the stool or black tarry bowel movements. He has had no trouble with swallowing. He's passing his water without problems. He does have some left low back pain that catches him at times and is occurring more frequently. He denies any injury. This pain radiates to his left hip. He sees the eye doctor regularly.     Patient Active Problem List   Diagnosis Date Noted  . Erectile dysfunction due to arterial insufficiency 01/23/2016  . At risk for cardiac dysfunction 10/11/2014  . Right bundle branch block, history of 01/17/2014  . Hyperlipemia 07/12/2013  . Type II diabetes mellitus ([place]) 07/12/2013  . BPH (benign prostatic hyperplasia) 07/12/2013   Outpatient Encounter Prescriptions as of 05/05/2016  Medication Sig  . aspirin (ASPIRIN LOW DOSE) 81 MG EC tablet Take 81 mg by mouth daily.    [name] atorvastatin (LIPITOR) 80 MG tablet Take 0.5  tablets (40 mg total) by mouth at bedtime.  [name] azelastine (ASTELIN) 0.1 % nasal spray 2 sprays each nostril at bedtime  . Cholecalciferol (VITAMIN D3) 5000 UNITS CAPS Take by mouth.    . fluticasone (FLONASE) 50 MCG/ACT nasal spray Place 2 sprays into both nostrils daily.  [name] glucose blood (ONE TOUCH ULTRA TEST) test strip Test bid, Dx E11.9  . lisinopril (PRINIVIL,ZESTRIL) 5 MG tablet TAKE 1 TABLET (5 MG TOTAL) BY MOUTH DAILY.  . metFORMIN (GLUCOPHAGE) 500 MG tablet Take 1 tablet (500 mg total) by mouth daily.  . Omega 3 1000 MG CAPS Take 2 capsules (2,000 mg total) by mouth 2 (two) times daily. (Patient taking differently: Take 1 capsule by mouth daily. Fish oil OTC)  . sitaGLIPtin (JANUVIA) 100 MG tablet Take 1 tablet (100 mg total) by mouth daily.   No facility-administered encounter medications on file as of 05/05/2016.      Review of Systems  Constitutional: Negative.   HENT: Negative.   Eyes: Negative.   Respiratory: Negative.   Cardiovascular: Negative.   Gastrointestinal: Negative.   Endocrine: Negative.   Genitourinary: Negative.   Musculoskeletal: Negative.   Skin: Negative.   Allergic/Immunologic: Negative.   Neurological: Negative.   Hematological: Negative.   Psychiatric/Behavioral: Negative.        Objective:   Physical Exam  Constitutional: He is oriented to person, place, and time. He appears well-developed and well-nourished.  HENT:  Head: Normocephalic and atraumatic.  Right Ear: External ear normal.  Left Ear: External ear normal.  Nose: Nose normal.  Mouth/Throat: Oropharynx is clear and moist. No oropharyngeal exudate.  Eyes: Conjunctivae and EOM are normal. Pupils are equal, round, and reactive to light. Right eye exhibits no discharge. Left eye exhibits no discharge. No scleral icterus.  Neck: Normal range of motion. Neck supple. No thyromegaly present.  No carotid bruits thyromegaly or adenopathy  Cardiovascular: Normal rate, regular rhythm, normal  heart sounds and intact distal pulses.   No murmur heard. The heart is regular at 72/m  Pulmonary/Chest: Effort normal and breath sounds normal. No respiratory distress. He has no wheezes. He has no rales. He exhibits no tenderness.  Clear anteriorly and posteriorly  Abdominal: Soft. Bowel sounds are normal. He exhibits no mass. There is no tenderness. There is no rebound and no guarding.  No abdominal tenderness liver or spleen enlargement or bruits. No inguinal adenopathy.  Musculoskeletal: Normal range of motion. He exhibits no edema or tenderness.  Lymphadenopathy:    He has no cervical adenopathy.  Neurological: He is alert and oriented to person, place, and time. He has normal reflexes. No cranial nerve deficit.  Skin: Skin is warm and dry. No rash noted.  Psychiatric: He has a normal mood and affect. His behavior is normal. Judgment and thought content normal.  Nursing note and vitals reviewed.   BP 140/75 mmHg  Pulse 58  Temp(Src) 97 F (36.1 C) (Oral)  Ht '5\' 11"'  (1.803 m)  Wt 183 lb (83.008 kg)  BMI 25.53 kg/m2  [facility] reading (PRIMARY) by  Dr. Bobbe Medico spine, results pending                                     Assessment & Plan:  1. Type 2 diabetes mellitus without complication, without long-term current use of insulin (HCC) -Continue Januvia and metformin and resume more exercise and more aggressive diet habits. - Bayer DCA Hb A1c Waived; Future - BMP8+EGFR; Future - CBC with Differential/Platelet; Future - Exercise Tolerance Test; Future  2. Hyperlipemia -Continue current treatment pending results of lab work - CBC with Differential/Platelet; Future - Hepatic function panel; Future - NMR, lipoprofile; Future - Exercise Tolerance Test; Future  3. BPH (benign prostatic hyperplasia) -The patient has no symptoms with his prostate enlargement today. - CBC with Differential/Platelet; Future  4. Vitamin D deficiency -Continue current treatment pending results of  lab work - CBC with Differential/Platelet; Future - VITAMIN D 25 Hydroxy (Vit-D Deficiency, Fractures); Future  5. Health care maintenance -Schedule colonoscopy and exercise treadmill test -Monitor blood pressures at home and bring readings to next visit - CBC with Differential/Platelet; Future  6. Left low back pain, with sciatica presence unspecified -We will call with results of x-ray and make a decision about it to something we need to do regarding this when the results are returned - DG Lumbar Spine 2-3 Views; Future  7. Special screening for malignant neoplasms, colon - Ambulatory referral to Gastroenterology  Patient Instructions                       Medicare Annual Wellness Visit  [facility] and the medical providers at [facility] strive to bring you the best medical care.  In doing so we not only want to address your current medical conditions and concerns but also to detect new conditions early and prevent  illness, disease and health-related problems.    Medicare offers a yearly Wellness Visit which allows our clinical staff to assess your need for preventative services including immunizations, lifestyle education, counseling to decrease risk of preventable diseases and screening for fall risk and other medical concerns.    This visit is provided free of charge (no copay) for all Medicare recipients. The clinical pharmacists at [facility] have begun to conduct these Wellness Visits which will also include a thorough review of all your medications.    As you primary medical provider recommend that you make an appointment for your Annual Wellness Visit if you have not done so already this year.  You may set up this appointment before you leave today or you may call back [phone]) and schedule an appointment.  Please make sure when you call that you mention that you are scheduling your Annual Wellness Visit with the clinical  pharmacist so that the appointment may be made for the proper length of time.     Continue current medications. Continue good therapeutic lifestyle changes which include good diet and exercise. Fall precautions discussed with patient. If an FOBT was given today- please return it to our front desk. If you are over 52 years old - you may need Prevnar 30 or the adult Pneumonia vaccine.  **Flu shots are available--- please call and schedule a FLU-CLINIC appointment**  After your visit with [place] today you will receive a survey in the mail or online from [employer] regarding your care with [place]. Please take a moment to fill this out. Your feedback is very important to [place] as you can help [place] better understand your patient needs as well as improve your experience and satisfaction. WE CARE ABOUT YOU!!!   Continue to work on exercise and diet Continue to watch sodium intake Purchase an Omron low pressure monitor and record some blood pressure readings and bring these to the next visit We will arrange for you to have a repeat colonoscopy as it has been almost 10 years since the last one. We will also arrange for you to have an exercise treadmill test by one of our providers    Arrie Senate MD

## 2016-05-11 ENCOUNTER — Telehealth: Payer: Self-pay | Admitting: *Deleted

## 2016-05-11 DIAGNOSIS — Z9189 Other specified personal risk factors, not elsewhere classified: Secondary | ICD-10-CM

## 2016-05-11 DIAGNOSIS — E119 Type 2 diabetes mellitus without complications: Secondary | ICD-10-CM

## 2016-05-11 DIAGNOSIS — E785 Hyperlipidemia, unspecified: Secondary | ICD-10-CM

## 2016-05-11 NOTE — Telephone Encounter (Signed)
-----   Message from Worthy Rancher, MD sent at 05/07/2016 12:08 PM EDT ----- Regarding: RE: treadmill This one is probably too high risk, please send to cardiology.  ----- Message -----    From: Thana Ates, LPN    Sent: [id number]   8:36 AM      To: Georga Kaufmann, LPN, Theron Arista, RN, # Subject: treadmill                                      Please review for treadmill

## 2016-05-11 NOTE — Telephone Encounter (Signed)
Patient aware and referral place.  

## 2016-05-11 NOTE — Telephone Encounter (Signed)
Lm for patient to call us back in regards to stress test

## 2016-05-30 ENCOUNTER — Other Ambulatory Visit: Payer: PPO

## 2016-05-30 DIAGNOSIS — E785 Hyperlipidemia, unspecified: Secondary | ICD-10-CM | POA: Diagnosis not present

## 2016-05-30 DIAGNOSIS — E119 Type 2 diabetes mellitus without complications: Secondary | ICD-10-CM

## 2016-06-01 LAB — NMR, LIPOPROFILE
Cholesterol: 120 mg/dL (ref 100–199)
HDL CHOLESTEROL BY NMR: 46 mg/dL (ref >39)
HDL PARTICLE NUMBER: 27.7 umol/L — AB (ref >=30.5)
LDL Particle Number: 852 nmol/L (ref <1000)
LDL Size: 20.6 nm (ref >20.5)
LDL-C: 60 mg/dL (ref 0–99)
LP-IR Score: 43 (ref <=45)
SMALL LDL PARTICLE NUMBER: 451 nmol/L (ref <=527)
TRIGLYCERIDES BY NMR: 72 mg/dL (ref 0–149)

## 2016-06-01 LAB — BMP8+EGFR
BUN/Creatinine Ratio: 22 (ref 10–24)
BUN: 17 mg/dL (ref 8–27)
CO2: 25 mmol/L (ref 18–29)
Calcium: 9.2 mg/dL (ref 8.6–10.2)
Chloride: 99 mmol/L (ref 96–106)
Creatinine, Ser: 0.77 mg/dL (ref 0.76–1.27)
GFR calc Af Amer: 109 mL/min/{1.73_m2} (ref >59)
GFR calc non Af Amer: 95 mL/min/{1.73_m2} (ref >59)
Glucose: 147 mg/dL — ABNORMAL HIGH (ref 65–99)
Potassium: 5.1 mmol/L (ref 3.5–5.2)
Sodium: 137 mmol/L (ref 134–144)

## 2016-06-01 LAB — HEPATIC FUNCTION PANEL
ALBUMIN: 4.2 g/dL (ref 3.6–4.8)
ALT: 18 IU/L (ref 0–44)
AST: 18 IU/L (ref 0–40)
Alkaline Phosphatase: 49 IU/L (ref 39–117)
BILIRUBIN TOTAL: 0.6 mg/dL (ref 0.0–1.2)
Bilirubin, Direct: 0.19 mg/dL (ref 0.00–0.40)
Total Protein: 6.6 g/dL (ref 6.0–8.5)

## 2016-06-01 LAB — CBC WITH DIFFERENTIAL/PLATELET
Basophils Absolute: 0 10*3/uL (ref 0.0–0.2)
Basos: 0 %
EOS (ABSOLUTE): 0.1 10*3/uL (ref 0.0–0.4)
EOS: 1 %
HEMATOCRIT: 45.5 % (ref 37.5–51.0)
Hemoglobin: 14.7 g/dL (ref 12.6–17.7)
IMMATURE GRANS (ABS): 0 10*3/uL (ref 0.0–0.1)
IMMATURE GRANULOCYTES: 0 %
LYMPHS: 34 %
Lymphocytes Absolute: 1.9 10*3/uL (ref 0.7–3.1)
MCH: 29.1 pg (ref 26.6–33.0)
MCHC: 32.3 g/dL (ref 31.5–35.7)
MCV: 90 fL (ref 79–97)
MONOCYTES: 7 %
MONOS ABS: 0.4 10*3/uL (ref 0.1–0.9)
NEUTROS PCT: 58 %
Neutrophils Absolute: 3.2 10*3/uL (ref 1.4–7.0)
PLATELETS: 217 10*3/uL (ref 150–379)
RBC: 5.05 x10E6/uL (ref 4.14–5.80)
RDW: 13.8 % (ref 12.3–15.4)
WBC: 5.6 10*3/uL (ref 3.4–10.8)

## 2016-06-01 LAB — BAYER DCA HB A1C WAIVED: HB A1C: 7.9 % — AB (ref <7.0)

## 2016-06-01 LAB — VITAMIN D 25 HYDROXY (VIT D DEFICIENCY, FRACTURES): Vit D, 25-Hydroxy: 49.4 ng/mL (ref 30.0–100.0)

## 2016-07-02 ENCOUNTER — Telehealth: Payer: Self-pay

## 2016-07-02 ENCOUNTER — Ambulatory Visit (AMBULATORY_SURGERY_CENTER): Payer: Self-pay

## 2016-07-02 VITALS — Ht 71.0 in | Wt 179.4 lb

## 2016-07-02 DIAGNOSIS — Z1211 Encounter for screening for malignant neoplasm of colon: Secondary | ICD-10-CM

## 2016-07-02 MED ORDER — SUPREP BOWEL PREP KIT 17.5-3.13-1.6 GM/177ML PO SOLN: 1.0000 | Freq: Once | ORAL | Status: DC

## 2016-07-02 NOTE — Telephone Encounter (Signed)
Dr Ardis Hughs,      Pt at [facility] today.  Denied cardiac issues.  Stated 09/14/16 is a "routine" treadmill stress test.  He had one 5 yrs ago and reported that one was unremarkable.  Please advise if you wish to postpone colonoscopy.                                                                                                                           Thank you, Angela//PV

## 2016-07-02 NOTE — Telephone Encounter (Signed)
Ok to proceed with colonoscopy  

## 2016-07-02 NOTE — Progress Notes (Signed)
No allergies to eggs or soy No past problems with anesthesia No diet meds No home oxygen  Has email and internet registered for emmi 

## 2016-07-03 ENCOUNTER — Other Ambulatory Visit: Payer: Self-pay

## 2016-07-03 ENCOUNTER — Encounter: Payer: Self-pay | Admitting: Family Medicine

## 2016-07-03 MED ORDER — METFORMIN HCL 500 MG PO TABS: 500.0000 mg | ORAL_TABLET | Freq: Two times a day (BID) | ORAL | Status: DC

## 2016-07-08 ENCOUNTER — Other Ambulatory Visit: Payer: Self-pay | Admitting: Family Medicine

## 2016-07-10 ENCOUNTER — Encounter: Payer: Self-pay | Admitting: Gastroenterology

## 2016-07-18 ENCOUNTER — Other Ambulatory Visit: Payer: Self-pay | Admitting: Family Medicine

## 2016-07-22 ENCOUNTER — Encounter: Payer: Self-pay | Admitting: Gastroenterology

## 2016-07-22 ENCOUNTER — Ambulatory Visit (AMBULATORY_SURGERY_CENTER): Payer: PPO | Admitting: Gastroenterology

## 2016-07-22 VITALS — BP 106/58 | HR 55 | Temp 98.7°F | Resp 15 | Ht 71.0 in | Wt 179.0 lb

## 2016-07-22 DIAGNOSIS — Z1211 Encounter for screening for malignant neoplasm of colon: Secondary | ICD-10-CM | POA: Diagnosis not present

## 2016-07-22 DIAGNOSIS — D122 Benign neoplasm of ascending colon: Secondary | ICD-10-CM | POA: Diagnosis not present

## 2016-07-22 DIAGNOSIS — I10 Essential (primary) hypertension: Secondary | ICD-10-CM | POA: Diagnosis not present

## 2016-07-22 DIAGNOSIS — E119 Type 2 diabetes mellitus without complications: Secondary | ICD-10-CM | POA: Diagnosis not present

## 2016-07-22 LAB — GLUCOSE, CAPILLARY
GLUCOSE-CAPILLARY: 144 mg/dL — AB (ref 65–99)
Glucose-Capillary: 134 mg/dL — ABNORMAL HIGH (ref 65–99)

## 2016-07-22 NOTE — Progress Notes (Signed)
Report to PACU, RN, vss, BBS= Clear.  

## 2016-07-22 NOTE — Op Note (Signed)
Peter Mathis Patient Name: Peter Mathis Procedure Date: 07/22/2016 9:25 AM MRN: [id number] Endoscopist: Milus Banister , MD Age: 65 Referring MD:  Date of Birth: Mar 15, 1951 Gender: Male Account #: [id number] Procedure:                Colonoscopy Indications:              Screening for colorectal malignant neoplasm;                            colonoscopy Dr. Ardis Hughs 2017, no polyps Medicines:                Monitored Anesthesia Care Procedure:                Pre-Anesthesia Assessment:                           - Prior to the procedure, a History and Physical                            was performed, and patient medications and                            allergies were reviewed. The patient's tolerance of                            previous anesthesia was also reviewed. The risks                            and benefits of the procedure and the sedation                            options and risks were discussed with the patient.                            All questions were answered, and informed consent                            was obtained. Prior Anticoagulants: The patient has                            taken no previous anticoagulant or antiplatelet                            agents. ASA Grade Assessment: II - A patient with                            mild systemic disease. After reviewing the risks                            and benefits, the patient was deemed in                            satisfactory condition to undergo the procedure.  After obtaining informed consent, the colonoscope                            was passed under direct vision. Throughout the                            procedure, the patient's blood pressure, pulse, and                            oxygen saturations were monitored continuously. The                            Model CF-HQ190L [id number] scope was introduced                            through the anus and  advanced to the the cecum,                            identified by appendiceal orifice and ileocecal                            valve. The colonoscopy was performed without                            difficulty. The patient tolerated the procedure                            well. The quality of the bowel preparation was                            excellent. The ileocecal valve, appendiceal                            orifice, and rectum were photographed. Scope In: 9:51:05 AM Scope Out: 10:00:55 AM Scope Withdrawal Time: 0 hours 5 minutes 54 seconds  Total Procedure Duration: 0 hours 9 minutes 50 seconds  Findings:                 A 4 mm polyp was found in the ascending colon. The                            polyp was sessile. The polyp was removed with a                            cold snare. Resection and retrieval were complete.                           The exam was otherwise without abnormality on                            direct and retroflexion views. Complications:            No immediate complications. Estimated blood loss:  None. Estimated Blood Loss:     Estimated blood loss: none. Impression:               - One 4 mm polyp in the ascending colon, removed                            with a cold snare. Resected and retrieved.                           - The examination was otherwise normal on direct                            and retroflexion views. Recommendation:           - Patient has a contact number available for                            emergencies. The signs and symptoms of potential                            delayed complications were discussed with the                            patient. Return to normal activities tomorrow.                            Written discharge instructions were provided to the                            patient.                           - Resume previous diet.                           - Continue present  medications.                           You will receive a letter within 2-3 weeks with the                            pathology results and my final recommendations.                           If the polyp(s) is proven to be 'pre-cancerous' on                            pathology, you will need repeat colonoscopy in 5                            years. If the polyp(s) is NOT 'precancerous' on                            pathology then you should repeat colon cancer  screening in 10 years with colonoscopy without need                            for colon cancer screening by any method prior to                            then (including stool testing). Milus Banister, MD 07/22/2016 10:06:55 AM This report has been signed electronically.

## 2016-07-22 NOTE — Progress Notes (Signed)
Called to room to assist during endoscopic procedure.  Patient ID and intended procedure confirmed with present staff. Received instructions for my participation in the procedure from the performing physician.  

## 2016-07-22 NOTE — Patient Instructions (Addendum)
YOU HAD AN ENDOSCOPIC PROCEDURE TODAY AT THE [facility]:   Refer to the procedure report that was given to you for any specific questions about what was found during the examination.  If the procedure report does not answer your questions, please call your gastroenterologist to clarify.  If you requested that your care partner not be given the details of your procedure findings, then the procedure report has been included in a sealed envelope for you to review at your convenience later.  YOU SHOULD EXPECT: Some feelings of bloating in the abdomen. Passage of more gas than usual.  Walking can help get rid of the air that was put into your GI tract during the procedure and reduce the bloating. If you had a lower endoscopy (such as a colonoscopy or flexible sigmoidoscopy) you may notice spotting of blood in your stool or on the toilet paper. If you underwent a bowel prep for your procedure, you may not have a normal bowel movement for a few days.  Please Note:  You might notice some irritation and congestion in your nose or some drainage.  This is from the oxygen used during your procedure.  There is no need for concern and it should clear up in a day or so.  SYMPTOMS TO REPORT IMMEDIATELY:   Following lower endoscopy (colonoscopy or flexible sigmoidoscopy):  Excessive amounts of blood in the stool  Significant tenderness or worsening of abdominal pains  Swelling of the abdomen that is new, acute  Fever of 100F or higher   For urgent or emergent issues, a gastroenterologist can be reached at any hour by calling [phone].   DIET: Your first meal following the procedure should be a small meal and then it is ok to progress to your normal diet. Heavy or fried foods are harder to digest and may make you feel nauseous or bloated.  Likewise, meals heavy in dairy and vegetables can increase bloating.  Drink plenty of fluids but you should avoid alcoholic beverages for 24  hours.  ACTIVITY:  You should plan to take it easy for the rest of today and you should NOT DRIVE or use heavy machinery until tomorrow (because of the sedation medicines used during the test).    FOLLOW UP: Our staff will call the number listed on your records the next business day following your procedure to check on you and address any questions or concerns that you may have regarding the information given to you following your procedure. If we do not reach you, we will leave a message.  However, if you are feeling well and you are not experiencing any problems, there is no need to return our call.  We will assume that you have returned to your regular daily activities without incident.  If any biopsies were taken you will be contacted by phone or by letter within the next 1-3 weeks.  Please call us at [phone] if you have not heard about the biopsies in 3 weeks.    SIGNATURES/CONFIDENTIALITY: You and/or your care partner have signed paperwork which will be entered into your electronic medical record.  These signatures attest to the fact that that the information above on your After Visit Summary has been reviewed and is understood.  Full responsibility of the confidentiality of this discharge information lies with you and/or your care-partner.    Resume medications. Information given on polyps. 

## 2016-07-23 ENCOUNTER — Telehealth: Payer: Self-pay | Admitting: *Deleted

## 2016-07-23 NOTE — Telephone Encounter (Signed)
  Follow up Call-  Call back number 07/22/2016  Post procedure Call Back phone  # [phone]  Permission to leave phone message Yes  Some recent data might be hidden   [facility]

## 2016-07-28 ENCOUNTER — Encounter: Payer: Self-pay | Admitting: Gastroenterology

## 2016-08-17 ENCOUNTER — Encounter: Payer: Self-pay | Admitting: Nurse Practitioner

## 2016-08-17 ENCOUNTER — Ambulatory Visit (INDEPENDENT_AMBULATORY_CARE_PROVIDER_SITE_OTHER): Payer: PPO | Admitting: Nurse Practitioner

## 2016-08-17 VITALS — BP 135/84 | HR 75 | Temp 97.1°F | Ht 71.0 in | Wt 177.0 lb

## 2016-08-17 DIAGNOSIS — B880 Other acariasis: Secondary | ICD-10-CM | POA: Diagnosis not present

## 2016-08-17 NOTE — Progress Notes (Signed)
   Subjective:    Patient ID: Peter Mathis, male    DOB: Aug 02, 1951, 65 y.o.   MRN: [id number]  HPI  Patient comes in today c/o rash on lower legs and is spreading upward. Started after doing some weed eating. Very itchy.   Review of Systems  Constitutional: Negative.   HENT: Negative.   Respiratory: Negative.   Cardiovascular: Negative.   Gastrointestinal: Negative.   Genitourinary: Negative.   Musculoskeletal: Negative.   Neurological: Negative.   Psychiatric/Behavioral: Negative.   All other systems reviewed and are negative.      Objective:   Physical Exam  Constitutional: He is oriented to person, place, and time. He appears well-developed.  Cardiovascular: Normal rate, regular rhythm and normal heart sounds.   Pulmonary/Chest: Effort normal and breath sounds normal.  Neurological: He is alert and oriented to person, place, and time.  Skin: Skin is warm. Rash (erythematous maculopapular rash  on bil lower ext and up ward to waist.) noted.   BP 135/84   Pulse 75   Temp 97.1 F (36.2 C) (Oral)   Ht 5\' 11"  (1.803 m)   Wt 177 lb (80.3 kg)   BMI 24.69 kg/m        Assessment & Plan:   1. Chigger bites    1 cup ammonia 1 tsp baking soda 1 tsp meat tenderizer *Mix together and dab on bites Avoid scratching RTO prn  Mary-Margaret Hassell Done, FNP

## 2016-08-17 NOTE — Patient Instructions (Signed)
1 cup ammonia 1 tsp baking soda 1 tsp meat tenderizer *Mix together and dab on bites  

## 2016-08-20 ENCOUNTER — Telehealth: Payer: Self-pay | Admitting: Gastroenterology

## 2016-08-20 NOTE — Telephone Encounter (Signed)
Santiago Glad, please apologized to him for the delay in getting back to him about his biopsy results. This happened to  5 other patients around the same time. Let him know that his polyp was not precancerous and that he needs recall colonoscopy in 10 years.

## 2016-08-20 NOTE — Telephone Encounter (Signed)
Called patient's home and left a message, then called work # provided. I was able to speak with the patient and provide pathology results and recal notice. Apology given for delay  and well received by the patient.

## 2016-09-14 ENCOUNTER — Ambulatory Visit: Payer: PPO | Admitting: Cardiology

## 2016-09-14 ENCOUNTER — Other Ambulatory Visit: Payer: Self-pay | Admitting: Family Medicine

## 2016-09-30 ENCOUNTER — Encounter: Payer: Self-pay | Admitting: Cardiology

## 2016-09-30 ENCOUNTER — Ambulatory Visit: Payer: PPO | Admitting: Family Medicine

## 2016-10-07 ENCOUNTER — Other Ambulatory Visit: Payer: Self-pay | Admitting: Family Medicine

## 2016-10-12 ENCOUNTER — Other Ambulatory Visit: Payer: Self-pay | Admitting: Family Medicine

## 2016-10-14 ENCOUNTER — Encounter: Payer: Self-pay | Admitting: Cardiology

## 2016-10-14 ENCOUNTER — Ambulatory Visit (INDEPENDENT_AMBULATORY_CARE_PROVIDER_SITE_OTHER): Payer: PPO | Admitting: Cardiology

## 2016-10-14 VITALS — BP 126/70 | HR 78 | Ht 71.0 in | Wt 178.1 lb

## 2016-10-14 DIAGNOSIS — I4510 Unspecified right bundle-branch block: Secondary | ICD-10-CM | POA: Diagnosis not present

## 2016-10-14 DIAGNOSIS — E7800 Pure hypercholesterolemia, unspecified: Secondary | ICD-10-CM | POA: Diagnosis not present

## 2016-10-14 DIAGNOSIS — E119 Type 2 diabetes mellitus without complications: Secondary | ICD-10-CM | POA: Diagnosis not present

## 2016-10-14 NOTE — Patient Instructions (Signed)
Medication Instructions:  Your physician recommends that you continue on your current medications as directed. Please refer to the Current Medication list given to you today.   Labwork: None   Testing/Procedures: Your physician has requested that you have an exercise tolerance test. For further information please visit [email or web address]. Please also follow instruction sheet, as given.     Follow-Up: As needed with Dr Aundra Dubin      If you need a refill on your cardiac medications before your next appointment, please call your pharmacy.

## 2016-10-15 NOTE — Progress Notes (Signed)
Patient ID: Peter Mathis, male   DOB: 11/26/1951, 65 y.o.   MRN: [id number] PCP: Dr. Laurance Flatten  65 yo with history of type II diabetes and hyperlipidemia presents for cardiology evaluation. ECG has shown RBBB with LAFB.  He is doing quite well symptomatically.  He has not exertional chest pain or dyspnea.  He can jog without much difficulty.  No lightheadedness or palpitations.  No lightheadedness or syncope.   ECG: NSR, 1st degree AV block, LAFB, iRBBB  Labs (8/15): LDL 42, LDL particle number 410 Labs (6/17): LDL 60, LDL-P 852, K 5.1, creatinine 0.77  PMH: 1. [place] Spotted Fever: 8/15 diagnosis.  2. H/o RBBB - Echo (10/15) with EF 60-65%, mild LVH, mild MR.  3. Type II diabetes 4. Hyperlipidemia 5. 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 Nonsmoker.  Married with children.   FH: Father with CAD, mother with PAD and atrial fibrillation.   ROS: All systems reviewed and negative except as per HPI.   Current Outpatient Prescriptions  Medication Sig Dispense Refill  . aspirin (ASPIRIN LOW DOSE) 81 MG EC tablet Take 81 mg by mouth daily.      [name] atorvastatin (LIPITOR) 80 MG tablet Take 1/2 tablet by mouth every night at bedtime 45 tablet 0  . azelastine (ASTELIN) 0.1 % nasal spray 2 sprays each nostril at bedtime 90 mL 3  . Cholecalciferol (VITAMIN D3) 5000 UNITS CAPS Take 1 capsule by mouth daily.     . fluticasone (FLONASE) 50 MCG/ACT nasal spray Place 2 sprays into both nostrils daily. 48 g 3  . glucose blood (ONE TOUCH ULTRA TEST) test strip Test bid, Dx E11.9 300 each 3  . JANUVIA 100 MG tablet Take 1 tablet by mouth once daily 90 tablet 0  . lisinopril (PRINIVIL,ZESTRIL) 5 MG tablet Take 1 tablet by mouth once daily 90 tablet 0  . metFORMIN (GLUCOPHAGE) 500 MG tablet Take 1 tablet by mouth twice a day with meals 180 tablet 0  . Omega-3 Fatty Acids (FISH OIL) 1000 MG CAPS Take 2,000 mg by mouth daily.     No current  facility-administered medications for this visit.     BP 126/70   Pulse 78   Ht 5\' 11"  (1.803 m)   Wt 178 lb 1.9 oz (80.8 kg)   BMI 24.84 kg/m  General: NAD Neck: No JVD, no thyromegaly or thyroid nodule.  Lungs: Clear to auscultation bilaterally with normal respiratory effort. CV: Nondisplaced PMI.  Heart regular S1/S2, widely split S2, no S3/S4, no murmur.  No peripheral edema.  No carotid bruit.  Normal pedal pulses.  Abdomen: Soft, nontender, no hepatosplenomegaly, no distention.  Skin: Intact without lesions or rashes.  Neurologic: Alert and oriented x 3.  Psych: Normal affect. Extremities: No clubbing or cyanosis.  HEENT: Normal  Assessment/Plan: 1. Abnormal ECG: Patient has LAFB with iRBBB.  Given lack of symptoms, I do not think this is likely to represent significant coronary disease.  - He probably has some risk of progression of cardiac conduction system disease in the future.  Would make sure he gets an ECG yearly.  2. Cardiac risk: Patient's risk factors are diabetes and hyperlipidemia.  Parents have vascular disease but not premature onset.   - I would agree with continuing ASA 81, statin, and ACEI.  - As he is a diabetic with abnormal ECG, I will arrange for an ETT (no imaging) for risk stratification.  3. Hyperlipidemia:  Excellent lipid profile in 6/17.  Continue current statin.   If ETT is normal, he will followup in 1-2 years or as needed.   Loralie Champagne 10/15/16

## 2016-10-20 ENCOUNTER — Ambulatory Visit (INDEPENDENT_AMBULATORY_CARE_PROVIDER_SITE_OTHER): Payer: PPO

## 2016-10-20 DIAGNOSIS — E119 Type 2 diabetes mellitus without complications: Secondary | ICD-10-CM

## 2016-10-20 LAB — EXERCISE TOLERANCE TEST
CSEPED: 9 min
CSEPEDS: 54 s
CSEPEW: 11.5 METS
CSEPHR: 93 %
CSEPPHR: 144 {beats}/min
MPHR: 154 {beats}/min
RPE: 16
Rest HR: 67 {beats}/min

## 2016-10-21 ENCOUNTER — Ambulatory Visit (INDEPENDENT_AMBULATORY_CARE_PROVIDER_SITE_OTHER): Payer: PPO | Admitting: Family Medicine

## 2016-10-21 ENCOUNTER — Encounter: Payer: Self-pay | Admitting: Family Medicine

## 2016-10-21 VITALS — BP 140/77 | HR 74 | Temp 98.1°F | Ht 71.0 in | Wt 177.0 lb

## 2016-10-21 DIAGNOSIS — E7800 Pure hypercholesterolemia, unspecified: Secondary | ICD-10-CM | POA: Diagnosis not present

## 2016-10-21 DIAGNOSIS — R8290 Unspecified abnormal findings in urine: Secondary | ICD-10-CM | POA: Diagnosis not present

## 2016-10-21 DIAGNOSIS — N400 Enlarged prostate without lower urinary tract symptoms: Secondary | ICD-10-CM

## 2016-10-21 DIAGNOSIS — Z0000 Encounter for general adult medical examination without abnormal findings: Secondary | ICD-10-CM

## 2016-10-21 DIAGNOSIS — Z1283 Encounter for screening for malignant neoplasm of skin: Secondary | ICD-10-CM

## 2016-10-21 DIAGNOSIS — E559 Vitamin D deficiency, unspecified: Secondary | ICD-10-CM | POA: Diagnosis not present

## 2016-10-21 DIAGNOSIS — E119 Type 2 diabetes mellitus without complications: Secondary | ICD-10-CM

## 2016-10-21 DIAGNOSIS — Z23 Encounter for immunization: Secondary | ICD-10-CM | POA: Diagnosis not present

## 2016-10-21 LAB — BAYER DCA HB A1C WAIVED: HB A1C: 7.2 % — AB (ref <7.0)

## 2016-10-21 LAB — MICROSCOPIC EXAMINATION
BACTERIA UA: NONE SEEN
RBC, UA: NONE SEEN /hpf (ref 0–?)

## 2016-10-21 LAB — URINALYSIS, COMPLETE
Bilirubin, UA: NEGATIVE
Glucose, UA: NEGATIVE
NITRITE UA: NEGATIVE
PH UA: 5.5 (ref 5.0–7.5)
Protein, UA: NEGATIVE
RBC, UA: NEGATIVE
Specific Gravity, UA: 1.01 (ref 1.005–1.030)
UUROB: 1 mg/dL (ref 0.2–1.0)

## 2016-10-21 NOTE — Addendum Note (Signed)
Addended by: Zannie Cove on: 10/21/2016 05:37 PM   Modules accepted: Orders

## 2016-10-21 NOTE — Progress Notes (Signed)
Subjective:    Patient ID: Peter Mathis, male    DOB: [birth year], 65 y.o.   MRN: [id number]  HPI  Patient is here today for annual wellness exam and follow up of chronic medical problems which includes diabetes and  hyperlipidemia. He is taking medications regularly.Patient is doing well today with no specific complaints. He is due to get lab work a flu shot and a urinalysis. The patient just had a stress test yesterday and this was normal. I have not seen the report yet but this is per his history. He denies any chest pain pressure palpitations. He denies any shortness of breath. He denies any trouble with swallowing heartburn indigestion nausea vomiting diarrhea or blood in the stool. The colonoscopy done this year indicated he did not need to have another colonoscopy for 10 years. He's passing his water without problems.     Patient Active Problem List   Diagnosis Date Noted  . Erectile dysfunction due to arterial insufficiency 01/23/2016  . Right bundle branch block, history of 01/17/2014  . Hyperlipemia 07/12/2013  . Type II diabetes mellitus ([place]) 07/12/2013  . BPH (benign prostatic hyperplasia) 07/12/2013   Outpatient Encounter Prescriptions as of 10/21/2016  Medication Sig  . aspirin (ASPIRIN LOW DOSE) 81 MG EC tablet Take 81 mg by mouth daily.    [name] atorvastatin (LIPITOR) 80 MG tablet Take 1/2 tablet by mouth every night at bedtime  . azelastine (ASTELIN) 0.1 % nasal spray 2 sprays each nostril at bedtime  . Cholecalciferol (VITAMIN D3) 5000 UNITS CAPS Take 1 capsule by mouth daily.   . fluticasone (FLONASE) 50 MCG/ACT nasal spray Place 2 sprays into both nostrils daily.  [name] glucose blood (ONE TOUCH ULTRA TEST) test strip Test bid, Dx E11.9  . JANUVIA 100 MG tablet Take 1 tablet by mouth once daily  . lisinopril (PRINIVIL,ZESTRIL) 5 MG tablet Take 1 tablet by mouth once daily  . metFORMIN (GLUCOPHAGE) 500 MG tablet Take 1 tablet by mouth twice a day with meals  . Omega-3  Fatty Acids (FISH OIL) 1000 MG CAPS Take 2,000 mg by mouth daily.   No facility-administered encounter medications on file as of 10/21/2016.       Review of Systems  Constitutional: Negative.   HENT: Negative.   Eyes: Negative.   Respiratory: Negative.   Cardiovascular: Negative.   Gastrointestinal: Negative.   Endocrine: Negative.   Genitourinary: Negative.   Musculoskeletal: Negative.   Skin: Negative.   Allergic/Immunologic: Negative.   Neurological: Negative.   Hematological: Negative.   Psychiatric/Behavioral: Negative.        Objective:   Physical Exam  Constitutional: He is oriented to person, place, and time. He appears well-developed and well-nourished. No distress.  HENT:  Head: Normocephalic and atraumatic.  Right Ear: External ear normal.  Left Ear: External ear normal.  Mouth/Throat: Oropharynx is clear and moist. No oropharyngeal exudate.  Slight nasal congestion  Eyes: Conjunctivae and EOM are normal. Pupils are equal, round, and reactive to light. Right eye exhibits no discharge. Left eye exhibits no discharge. No scleral icterus.  Neck: Normal range of motion. Neck supple. No thyromegaly present.  No bruits thyromegaly or anterior cervical adenopathy  Cardiovascular: Normal rate, regular rhythm, normal heart sounds and intact distal pulses.   No murmur heard. The heart has a regular rate and rhythm at 72/m  Pulmonary/Chest: Effort normal and breath sounds normal. No respiratory distress. He has no wheezes. He has no rales. He exhibits no tenderness.  No  axillary adenopathy and good breath sounds anterior and posterior  Abdominal: Soft. Bowel sounds are normal. He exhibits no mass. There is no tenderness. There is no rebound and no guarding.  No liver or spleen enlargement no abdominal masses no bruits. There was a severe he small left inguinal hernia palpated.  Genitourinary: Rectum normal and penis normal.  Genitourinary Comments: The prostate was smooth  but enlarged. There were no lumps or masses. There are no rectal masses. As mentioned earlier there was a small left inguinal hernia palpable. The patient was not aware of this. The testicles were normal and the external genitalia were within normal limits. There were no inguinal nodes.  Musculoskeletal: Normal range of motion. He exhibits no edema.  Lymphadenopathy:    He has no cervical adenopathy.  Neurological: He is alert and oriented to person, place, and time. He has normal reflexes. No cranial nerve deficit.  Skin: Skin is warm and dry. No rash noted.  Psychiatric: He has a normal mood and affect. His behavior is normal. Judgment and thought content normal.  Nursing note and vitals reviewed.   BP 140/77 (BP Location: Left Arm)   Pulse 74   Temp 98.1 F (36.7 C) (Oral)   Ht '5\' 11"'  (1.803 m)   Wt 177 lb (80.3 kg)   BMI 24.69 kg/m        Assessment & Plan:  1. Type 2 diabetes mellitus without complication, without long-term current use of insulin ([place]) -Continue current treatment pending results of lab work - BMP8+EGFR - CBC with Differential/Platelet - Bayer Cotati Hb A1c Waived  2. Pure hypercholesterolemia -Continue current treatment pending results of lab work - Lipid panel - CBC with Differential/Platelet - Hepatic function panel  3. Benign prostatic hyperplasia, unspecified whether lower urinary tract symptoms present -The prostate remains enlarged but soft and smooth. The patient has no symptoms related to the prostate gland. - PSA, total and free - Urinalysis, Complete - CBC with Differential/Platelet  4. Vitamin D deficiency -Continue vitamin D replacement pending results of lab work - CBC with Differential/Platelet - VITAMIN D 25 Hydroxy (Vit-D Deficiency, Fractures)  5. Annual physical exam -We will schedule a visit with the dermatologist for follow-up of couple of lesions on the patient's face. - Lipid panel - PSA, total and free - Urinalysis,  Complete - BMP8+EGFR - CBC with Differential/Platelet - Hepatic function panel - VITAMIN D 25 Hydroxy (Vit-D Deficiency, Fractures) - Bayer DCA Hb A1c Waived  Patient Instructions                       Medicare Annual Wellness Visit  [facility] and the medical providers at [facility] strive to bring you the best medical care.  In doing so we not only want to address your current medical conditions and concerns but also to detect new conditions early and prevent illness, disease and health-related problems.    Medicare offers a yearly Wellness Visit which allows our clinical staff to assess your need for preventative services including immunizations, lifestyle education, counseling to decrease risk of preventable diseases and screening for fall risk and other medical concerns.    This visit is provided free of charge (no copay) for all Medicare recipients. The clinical pharmacists at [facility] have begun to conduct these Wellness Visits which will also include a thorough review of all your medications.    As you primary medical provider recommend that you make an appointment for  your Annual Wellness Visit if you have not done so already this year.  You may set up this appointment before you leave today or you may call back ([phone]) and schedule an appointment.  Please make sure when you call that you mention that you are scheduling your Annual Wellness Visit with the clinical pharmacist so that the appointment may be made for the proper length of time.    Continue current medications. Continue good therapeutic lifestyle changes which include good diet and exercise. Fall precautions discussed with patient. If an FOBT was given today- please return it to our front desk. If you are over 13 years old - you may need Prevnar 22 or the adult Pneumonia vaccine.  **Flu shots are available--- please call and schedule a FLU-CLINIC appointment**  After  your visit with [place] today you will receive a survey in the mail or online from [employer] regarding your care with [place]. Please take a moment to fill this out. Your feedback is very important to [place] as you can help [place] better understand your patient needs as well as improve your experience and satisfaction. WE CARE ABOUT YOU!!!   Continue to check blood sugars regularly and check feet regularly Continue to exercise and keep the blood sugar under as good of control as possible Get another stress test at least in for years because of your multiple risk factors or sooner if necessary We will call you with lab work results as soon as these were results become available Do not forget to schedule your eye exam  Arrie Senate MD

## 2016-10-21 NOTE — Patient Instructions (Addendum)
Medicare Annual Wellness Visit  [facility] and the medical providers at [facility] strive to bring you the best medical care.  In doing so we not only want to address your current medical conditions and concerns but also to detect new conditions early and prevent illness, disease and health-related problems.    Medicare offers a yearly Wellness Visit which allows our clinical staff to assess your need for preventative services including immunizations, lifestyle education, counseling to decrease risk of preventable diseases and screening for fall risk and other medical concerns.    This visit is provided free of charge (no copay) for all Medicare recipients. The clinical pharmacists at [facility] have begun to conduct these Wellness Visits which will also include a thorough review of all your medications.    As you primary medical provider recommend that you make an appointment for your Annual Wellness Visit if you have not done so already this year.  You may set up this appointment before you leave today or you may call back [phone] and schedule an appointment.  Please make sure when you call that you mention that you are scheduling your Annual Wellness Visit with the clinical pharmacist so that the appointment may be made for the proper length of time.    Continue current medications. Continue good therapeutic lifestyle changes which include good diet and exercise. Fall precautions discussed with patient. If an FOBT was given today- please return it to our front desk. If you are over 59 years old - you may need Prevnar 65 or the adult Pneumonia vaccine.  **Flu shots are available--- please call and schedule a FLU-CLINIC appointment**  After your visit with [place] today you will receive a survey in the mail or online from [employer] regarding your care with [place]. Please take a moment to fill this out. Your feedback is very  important to [place] as you can help [place] better understand your patient needs as well as improve your experience and satisfaction. WE CARE ABOUT YOU!!!   Continue to check blood sugars regularly and check feet regularly Continue to exercise and keep the blood sugar under as good of control as possible Get another stress test at least in for years because of your multiple risk factors or sooner if necessary We will call you with lab work results as soon as these were results become available Do not forget to schedule your eye exam We will schedule a visit with [facility] dermatology for general skin exam and specifically to look at a couple facial lesions.

## 2016-10-21 NOTE — Addendum Note (Signed)
Addended by: Zannie Cove on: 10/21/2016 03:38 PM   Modules accepted: Orders

## 2016-10-22 LAB — CBC WITH DIFFERENTIAL/PLATELET
BASOS ABS: 0 10*3/uL (ref 0.0–0.2)
Basos: 0 %
EOS (ABSOLUTE): 0 10*3/uL (ref 0.0–0.4)
Eos: 0 %
HEMOGLOBIN: 14.8 g/dL (ref 12.6–17.7)
Hematocrit: 42.6 % (ref 37.5–51.0)
Immature Grans (Abs): 0 10*3/uL (ref 0.0–0.1)
Immature Granulocytes: 0 %
LYMPHS ABS: 2.1 10*3/uL (ref 0.7–3.1)
LYMPHS: 20 %
MCH: 30 pg (ref 26.6–33.0)
MCHC: 34.7 g/dL (ref 31.5–35.7)
MCV: 86 fL (ref 79–97)
MONOCYTES: 7 %
Monocytes Absolute: 0.7 10*3/uL (ref 0.1–0.9)
Neutrophils Absolute: 7.6 10*3/uL — ABNORMAL HIGH (ref 1.4–7.0)
Neutrophils: 73 %
PLATELETS: 244 10*3/uL (ref 150–379)
RBC: 4.93 x10E6/uL (ref 4.14–5.80)
RDW: 13.7 % (ref 12.3–15.4)
WBC: 10.4 10*3/uL (ref 3.4–10.8)

## 2016-10-22 LAB — HEPATIC FUNCTION PANEL
ALBUMIN: 4.5 g/dL (ref 3.6–4.8)
ALK PHOS: 47 IU/L (ref 39–117)
ALT: 18 IU/L (ref 0–44)
AST: 14 IU/L (ref 0–40)
Bilirubin Total: 0.7 mg/dL (ref 0.0–1.2)
Bilirubin, Direct: 0.22 mg/dL (ref 0.00–0.40)
TOTAL PROTEIN: 7.2 g/dL (ref 6.0–8.5)

## 2016-10-22 LAB — BMP8+EGFR
BUN / CREAT RATIO: 17 (ref 10–24)
BUN: 14 mg/dL (ref 8–27)
CALCIUM: 9.3 mg/dL (ref 8.6–10.2)
CHLORIDE: 98 mmol/L (ref 96–106)
CO2: 24 mmol/L (ref 18–29)
Creatinine, Ser: 0.81 mg/dL (ref 0.76–1.27)
GFR calc Af Amer: 107 mL/min/{1.73_m2} (ref >59)
GFR calc non Af Amer: 93 mL/min/{1.73_m2} (ref >59)
GLUCOSE: 110 mg/dL — AB (ref 65–99)
POTASSIUM: 4.8 mmol/L (ref 3.5–5.2)
Sodium: 137 mmol/L (ref 134–144)

## 2016-10-22 LAB — LIPID PANEL
CHOLESTEROL TOTAL: 92 mg/dL — AB (ref 100–199)
Chol/HDL Ratio: 2.4 ratio units (ref 0.0–5.0)
HDL: 39 mg/dL — ABNORMAL LOW (ref >39)
LDL Calculated: 35 mg/dL (ref 0–99)
TRIGLYCERIDES: 90 mg/dL (ref 0–149)
VLDL Cholesterol Cal: 18 mg/dL (ref 5–40)

## 2016-10-22 LAB — VITAMIN D 25 HYDROXY (VIT D DEFICIENCY, FRACTURES): VIT D 25 HYDROXY: 60.6 ng/mL (ref 30.0–100.0)

## 2016-10-22 LAB — PSA, TOTAL AND FREE
PSA FREE PCT: 43.6 %
PSA FREE: 0.48 ng/mL
Prostate Specific Ag, Serum: 1.1 ng/mL (ref 0.0–4.0)

## 2016-10-23 LAB — URINE CULTURE

## 2016-10-24 ENCOUNTER — Other Ambulatory Visit: Payer: Self-pay | Admitting: *Deleted

## 2016-10-24 MED ORDER — SULFAMETHOXAZOLE-TRIMETHOPRIM 800-160 MG PO TABS: 1.0000 | ORAL_TABLET | Freq: Two times a day (BID) | ORAL | 0 refills | Status: DC

## 2016-10-24 NOTE — Telephone Encounter (Signed)
Patient aware of urine culture results and medication sent to [facility] in [facility]

## 2016-10-29 ENCOUNTER — Encounter: Payer: Self-pay | Admitting: Cardiology

## 2016-11-12 ENCOUNTER — Other Ambulatory Visit: Payer: PPO

## 2016-11-12 DIAGNOSIS — Z87440 Personal history of urinary (tract) infections: Secondary | ICD-10-CM

## 2016-11-12 LAB — URINALYSIS, COMPLETE
Bilirubin, UA: NEGATIVE
Ketones, UA: NEGATIVE
Nitrite, UA: NEGATIVE
PH UA: 5 (ref 5.0–7.5)
Specific Gravity, UA: 1.015 (ref 1.005–1.030)
UUROB: 0.2 mg/dL (ref 0.2–1.0)

## 2016-11-12 LAB — MICROSCOPIC EXAMINATION: BACTERIA UA: NONE SEEN

## 2016-11-13 LAB — URINE CULTURE

## 2016-11-17 ENCOUNTER — Encounter: Payer: Self-pay | Admitting: Family Medicine

## 2016-11-17 ENCOUNTER — Telehealth: Payer: Self-pay | Admitting: Family Medicine

## 2016-11-17 NOTE — Telephone Encounter (Signed)
Spoke with patient and he is aware that based on the urine culture results Dr. Laurance Flatten did not recommend any further treatment.

## 2016-11-25 ENCOUNTER — Other Ambulatory Visit: Payer: Self-pay | Admitting: Family Medicine

## 2016-12-17 ENCOUNTER — Other Ambulatory Visit: Payer: Self-pay | Admitting: Family Medicine

## 2017-01-12 ENCOUNTER — Other Ambulatory Visit: Payer: Self-pay | Admitting: *Deleted

## 2017-01-12 DIAGNOSIS — D225 Melanocytic nevi of trunk: Secondary | ICD-10-CM | POA: Diagnosis not present

## 2017-01-12 DIAGNOSIS — L814 Other melanin hyperpigmentation: Secondary | ICD-10-CM | POA: Diagnosis not present

## 2017-01-12 DIAGNOSIS — Q8789 Other specified congenital malformation syndromes, not elsewhere classified: Secondary | ICD-10-CM | POA: Insufficient documentation

## 2017-01-12 DIAGNOSIS — L821 Other seborrheic keratosis: Secondary | ICD-10-CM | POA: Diagnosis not present

## 2017-01-12 DIAGNOSIS — L918 Other hypertrophic disorders of the skin: Secondary | ICD-10-CM | POA: Diagnosis not present

## 2017-01-12 DIAGNOSIS — D1801 Hemangioma of skin and subcutaneous tissue: Secondary | ICD-10-CM | POA: Diagnosis not present

## 2017-01-12 DIAGNOSIS — L853 Xerosis cutis: Secondary | ICD-10-CM | POA: Diagnosis not present

## 2017-01-12 DIAGNOSIS — D485 Neoplasm of uncertain behavior of skin: Secondary | ICD-10-CM | POA: Diagnosis not present

## 2017-01-12 DIAGNOSIS — Q858 Other phakomatoses, not elsewhere classified: Secondary | ICD-10-CM | POA: Insufficient documentation

## 2017-01-12 DIAGNOSIS — D234 Other benign neoplasm of skin of scalp and neck: Secondary | ICD-10-CM | POA: Diagnosis not present

## 2017-01-12 MED ORDER — LISINOPRIL 5 MG PO TABS: 5.0000 mg | ORAL_TABLET | Freq: Every day | ORAL | 3 refills | Status: DC

## 2017-01-12 MED ORDER — METFORMIN HCL 500 MG PO TABS: 500.0000 mg | ORAL_TABLET | Freq: Two times a day (BID) | ORAL | 3 refills | Status: DC

## 2017-03-09 ENCOUNTER — Ambulatory Visit: Payer: PPO | Admitting: Family Medicine

## 2017-03-11 ENCOUNTER — Ambulatory Visit (INDEPENDENT_AMBULATORY_CARE_PROVIDER_SITE_OTHER): Payer: PPO | Admitting: Family Medicine

## 2017-03-11 ENCOUNTER — Other Ambulatory Visit: Payer: Self-pay | Admitting: *Deleted

## 2017-03-11 ENCOUNTER — Encounter: Payer: Self-pay | Admitting: Family Medicine

## 2017-03-11 VITALS — BP 124/64 | HR 72 | Temp 97.5°F | Ht 71.0 in | Wt 179.0 lb

## 2017-03-11 DIAGNOSIS — E559 Vitamin D deficiency, unspecified: Secondary | ICD-10-CM

## 2017-03-11 DIAGNOSIS — J301 Allergic rhinitis due to pollen: Secondary | ICD-10-CM | POA: Diagnosis not present

## 2017-03-11 DIAGNOSIS — E7800 Pure hypercholesterolemia, unspecified: Secondary | ICD-10-CM | POA: Diagnosis not present

## 2017-03-11 DIAGNOSIS — E119 Type 2 diabetes mellitus without complications: Secondary | ICD-10-CM

## 2017-03-11 DIAGNOSIS — N400 Enlarged prostate without lower urinary tract symptoms: Secondary | ICD-10-CM | POA: Diagnosis not present

## 2017-03-11 MED ORDER — SITAGLIPTIN PHOSPHATE 100 MG PO TABS: 100.0000 mg | ORAL_TABLET | Freq: Every day | ORAL | 1 refills | Status: DC

## 2017-03-11 MED ORDER — FLUTICASONE PROPIONATE 50 MCG/ACT NA SUSP: 2.0000 | Freq: Every day | NASAL | 3 refills | Status: DC

## 2017-03-11 MED ORDER — AZELASTINE HCL 0.1 % NA SOLN: NASAL | 3 refills | Status: DC

## 2017-03-11 NOTE — Progress Notes (Signed)
Subjective:    Patient ID: Peter Mathis, male    DOB: August 29, 1951, 66 y.o.   MRN: [id number]  HPI Pt here for follow up and management of chronic medical problems which includes hyperlipidemia and diabetes. He is taking Medication regularly.The patient is doing well overall. He is had several exams done over the past year including her colonoscopies exercise treadmill test and high exam. All of these things were good. The colonoscopy does not have to be repeated until 10 years. Though he did have a polyp. His A1c this last time or 4 months ago was improved from the time before that. His weight today is up a couple pounds. The patient denies any chest pain or shortness of breath. He denies any trouble with heartburn indigestion nausea vomiting diarrhea or blood in the stool. He has had some problems with his hemorrhoids and he's been using some Preparation H and this is helped this and they are better. He denies any trouble with passing his water. He did have his eye exam done in November at my eye doctor in [place]. He does complain today of some congestion and drainage and we will refill his Astelin nose spray and Flonase.     Patient Active Problem List   Diagnosis Date Noted  . Erectile dysfunction due to arterial insufficiency 01/23/2016  . Right bundle branch block, history of 01/17/2014  . Hyperlipemia 07/12/2013  . Type II diabetes mellitus ([place]) 07/12/2013  . BPH (benign prostatic hyperplasia) 07/12/2013   Outpatient Encounter Prescriptions as of 03/11/2017  Medication Sig  . aspirin (ASPIRIN LOW DOSE) 81 MG EC tablet Take 81 mg by mouth daily.    [name] atorvastatin (LIPITOR) 80 MG tablet Take 1/2 tablet by mouth every night at bedtime  . azelastine (ASTELIN) 0.1 % nasal spray 2 sprays each nostril at bedtime  . Cholecalciferol (VITAMIN D3) 5000 UNITS CAPS Take 1 capsule by mouth daily.   . fluticasone (FLONASE) 50 MCG/ACT nasal spray Place 2 sprays into both nostrils daily.  [name]  glucose blood (ONE TOUCH ULTRA TEST) test strip Test bid, Dx E11.9  . lisinopril (PRINIVIL,ZESTRIL) 5 MG tablet Take 1 tablet (5 mg total) by mouth daily.  . metFORMIN (GLUCOPHAGE) 500 MG tablet Take 1 tablet (500 mg total) by mouth 2 (two) times daily with a meal.  . Omega-3 Fatty Acids (FISH OIL) 1000 MG CAPS Take 2,000 mg by mouth daily.  . sitaGLIPtin (JANUVIA) 100 MG tablet Take 1 tablet (100 mg total) by mouth daily.  . [DISCONTINUED] JANUVIA 100 MG tablet Take 1 tablet by mouth once daily  . [DISCONTINUED] sulfamethoxazole-trimethoprim (BACTRIM DS) 800-160 MG tablet Take 1 tablet by mouth 2 (two) times daily.   No facility-administered encounter medications on file as of 03/11/2017.      Review of Systems  Constitutional: Negative.   HENT: Positive for postnasal drip (worse at night ).   Eyes: Negative.   Respiratory: Negative.   Cardiovascular: Negative.   Gastrointestinal: Negative.   Endocrine: Negative.   Genitourinary: Negative.   Musculoskeletal: Negative.   Skin: Negative.   Allergic/Immunologic: Negative.   Neurological: Negative.   Hematological: Negative.   Psychiatric/Behavioral: Negative.        Objective:   Physical Exam  Constitutional: He is oriented to person, place, and time. He appears well-developed and well-nourished. No distress.  HENT:  Head: Normocephalic and atraumatic.  Right Ear: External ear normal.  Left Ear: External ear normal.  Mouth/Throat: Oropharynx is clear and moist. No  oropharyngeal exudate.  Nasal congestion and turbinate swelling bilaterally  Eyes: Conjunctivae and EOM are normal. Pupils are equal, round, and reactive to light. Right eye exhibits no discharge. Left eye exhibits no discharge. No scleral icterus.  Neck: Normal range of motion. Neck supple. No thyromegaly present.  Cardiovascular: Normal rate, regular rhythm and intact distal pulses.   No murmur heard. Heart is regular at 72/m  Pulmonary/Chest: Effort normal and  breath sounds normal. No respiratory distress. He has no wheezes. He has no rales. He exhibits no tenderness.  Clear anteriorly and posteriorly and no axillary adenopathy  Abdominal: Soft. Bowel sounds are normal. He exhibits no mass. There is no tenderness. There is no rebound and no guarding.  No abdominal tenderness or masses. No organ enlargement. No inguinal adenopathy.  Musculoskeletal: Normal range of motion. He exhibits no edema.  Lymphadenopathy:    He has no cervical adenopathy.  Neurological: He is alert and oriented to person, place, and time. He has normal reflexes. No cranial nerve deficit.  Skin: Skin is warm and dry. No rash noted.  Psychiatric: He has a normal mood and affect. His behavior is normal. Judgment and thought content normal.  Nursing note and vitals reviewed.  BP (!) 143/83 (BP Location: Left Arm)   Pulse 72   Temp 97.5 F (36.4 C) (Oral)   Ht '5\' 11"'  (1.803 m)   Wt 179 lb (81.2 kg)   BMI 24.97 kg/m         Assessment & Plan:  1. Type 2 diabetes mellitus without complication, without long-term current use of insulin ([place]) -Continue current treatment pending results of lab work and aggressive therapeutic lifestyle changes - BMP8+EGFR; Future - CBC with Differential/Platelet; Future - Bayer DCA Hb A1c Waived; Future  2. Pure hypercholesterolemia -Continue current treatment pending results of lab work and aggressive therapeutic lifestyle changes - CBC with Differential/Platelet; Future - NMR, lipoprofile; Future - Hepatic function panel; Future  3. Vitamin D deficiency -Continue current treatment pending results of lab work - CBC with Differential/Platelet; Future - VITAMIN D 25 Hydroxy (Vit-D Deficiency, Fractures); Future  4. Benign prostatic hyperplasia, unspecified whether lower urinary tract symptoms present -No complaints with this today and no problems currently. - CBC with Differential/Platelet; Future  5. Chronic allergic rhinitis due  to pollen, unspecified seasonality -Use nasal saline and keep the house as cool as possible -Continue with Astelin nasal spray and Flonase and use as directed  No orders of the defined types were placed in this encounter.  Patient Instructions                       Medicare Annual Wellness Visit  [facility] and the medical providers at [facility] strive to bring you the best medical care.  In doing so we not only want to address your current medical conditions and concerns but also to detect new conditions early and prevent illness, disease and health-related problems.    Medicare offers a yearly Wellness Visit which allows our clinical staff to assess your need for preventative services including immunizations, lifestyle education, counseling to decrease risk of preventable diseases and screening for fall risk and other medical concerns.    This visit is provided free of charge (no copay) for all Medicare recipients. The clinical pharmacists at [facility] have begun to conduct these Wellness Visits which will also include a thorough review of all your medications.    As you primary medical  provider recommend that you make an appointment for your Annual Wellness Visit if you have not done so already this year.  You may set up this appointment before you leave today or you may call back [phone]) and schedule an appointment.  Please make sure when you call that you mention that you are scheduling your Annual Wellness Visit with the clinical pharmacist so that the appointment may be made for the proper length of time.     Continue current medications. Continue good therapeutic lifestyle changes which include good diet and exercise. Fall precautions discussed with patient. If an FOBT was given today- please return it to our front desk. If you are over 56 years old - you may need Prevnar 22 or the adult Pneumonia vaccine.  **Flu shots are  available--- please call and schedule a FLU-CLINIC appointment**  After your visit with [place] today you will receive a survey in the mail or online from [employer] regarding your care with [place]. Please take a moment to fill this out. Your feedback is very important to [place] as you can help [place] better understand your patient needs as well as improve your experience and satisfaction. WE CARE ABOUT YOU!!!   Continue current treatment Continue to watch sodium intake Exercise regularly We will call with lab work results as soon as they become available Use nasal saline frequently during the day and continue to use Astelin and Flonase at night Drink plenty of fluids and stay well hydrated and keep the house as cool as possible Continue to practice good hand hygiene and pulmonary hygiene  Arrie Senate MD

## 2017-03-11 NOTE — Patient Instructions (Addendum)
Medicare Annual Wellness Visit  [facility] and the medical providers at [facility] strive to bring you the best medical care.  In doing so we not only want to address your current medical conditions and concerns but also to detect new conditions early and prevent illness, disease and health-related problems.    Medicare offers a yearly Wellness Visit which allows our clinical staff to assess your need for preventative services including immunizations, lifestyle education, counseling to decrease risk of preventable diseases and screening for fall risk and other medical concerns.    This visit is provided free of charge (no copay) for all Medicare recipients. The clinical pharmacists at [facility] have begun to conduct these Wellness Visits which will also include a thorough review of all your medications.    As you primary medical provider recommend that you make an appointment for your Annual Wellness Visit if you have not done so already this year.  You may set up this appointment before you leave today or you may call back [phone] and schedule an appointment.  Please make sure when you call that you mention that you are scheduling your Annual Wellness Visit with the clinical pharmacist so that the appointment may be made for the proper length of time.     Continue current medications. Continue good therapeutic lifestyle changes which include good diet and exercise. Fall precautions discussed with patient. If an FOBT was given today- please return it to our front desk. If you are over 70 years old - you may need Prevnar 72 or the adult Pneumonia vaccine.  **Flu shots are available--- please call and schedule a FLU-CLINIC appointment**  After your visit with [place] today you will receive a survey in the mail or online from [employer] regarding your care with [place]. Please take a moment to fill this out. Your feedback is very  important to [place] as you can help [place] better understand your patient needs as well as improve your experience and satisfaction. WE CARE ABOUT YOU!!!   Continue current treatment Continue to watch sodium intake Exercise regularly We will call with lab work results as soon as they become available Use nasal saline frequently during the day and continue to use Astelin and Flonase at night Drink plenty of fluids and stay well hydrated and keep the house as cool as possible Continue to practice good hand hygiene and pulmonary hygiene

## 2017-03-11 NOTE — Addendum Note (Signed)
Addended by: Zannie Cove on: 03/11/2017 04:37 PM   Modules accepted: Orders

## 2017-03-12 ENCOUNTER — Other Ambulatory Visit: Payer: Self-pay

## 2017-03-12 MED ORDER — SITAGLIPTIN PHOSPHATE 100 MG PO TABS: 100.0000 mg | ORAL_TABLET | Freq: Every day | ORAL | 0 refills | Status: DC

## 2017-03-19 ENCOUNTER — Other Ambulatory Visit (INDEPENDENT_AMBULATORY_CARE_PROVIDER_SITE_OTHER): Payer: PPO

## 2017-03-19 DIAGNOSIS — N400 Enlarged prostate without lower urinary tract symptoms: Secondary | ICD-10-CM

## 2017-03-19 DIAGNOSIS — E119 Type 2 diabetes mellitus without complications: Secondary | ICD-10-CM | POA: Diagnosis not present

## 2017-03-19 DIAGNOSIS — E7800 Pure hypercholesterolemia, unspecified: Secondary | ICD-10-CM | POA: Diagnosis not present

## 2017-03-19 DIAGNOSIS — E559 Vitamin D deficiency, unspecified: Secondary | ICD-10-CM | POA: Diagnosis not present

## 2017-03-19 LAB — BAYER DCA HB A1C WAIVED: HB A1C: 7.4 % — AB (ref <7.0)

## 2017-03-20 LAB — CBC WITH DIFFERENTIAL/PLATELET
Basophils Absolute: 0 10*3/uL (ref 0.0–0.2)
Basos: 0 %
EOS (ABSOLUTE): 0.1 10*3/uL (ref 0.0–0.4)
EOS: 1 %
HEMATOCRIT: 45.2 % (ref 37.5–51.0)
HEMOGLOBIN: 15.1 g/dL (ref 13.0–17.7)
IMMATURE GRANS (ABS): 0 10*3/uL (ref 0.0–0.1)
Immature Granulocytes: 0 %
LYMPHS: 28 %
Lymphocytes Absolute: 2.2 10*3/uL (ref 0.7–3.1)
MCH: 29.3 pg (ref 26.6–33.0)
MCHC: 33.4 g/dL (ref 31.5–35.7)
MCV: 88 fL (ref 79–97)
MONOCYTES: 8 %
Monocytes Absolute: 0.7 10*3/uL (ref 0.1–0.9)
NEUTROS ABS: 4.9 10*3/uL (ref 1.4–7.0)
Neutrophils: 63 %
Platelets: 238 10*3/uL (ref 150–379)
RBC: 5.15 x10E6/uL (ref 4.14–5.80)
RDW: 13.2 % (ref 12.3–15.4)
WBC: 7.9 10*3/uL (ref 3.4–10.8)

## 2017-03-20 LAB — NMR, LIPOPROFILE
Cholesterol: 88 mg/dL — ABNORMAL LOW (ref 100–199)
HDL Cholesterol by NMR: 36 mg/dL — ABNORMAL LOW (ref >39)
HDL Particle Number: 29.9 umol/L — ABNORMAL LOW (ref >=30.5)
LDL PARTICLE NUMBER: 414 nmol/L (ref <1000)
LDL SIZE: 20.5 nm (ref >20.5)
LDL-C: 37 mg/dL (ref 0–99)
LP-IR Score: 43 (ref <=45)
Small LDL Particle Number: 262 nmol/L (ref <=527)
TRIGLYCERIDES BY NMR: 75 mg/dL (ref 0–149)

## 2017-03-20 LAB — BMP8+EGFR
BUN / CREAT RATIO: 18 (ref 10–24)
BUN: 14 mg/dL (ref 8–27)
CALCIUM: 8.8 mg/dL (ref 8.6–10.2)
CHLORIDE: 99 mmol/L (ref 96–106)
CO2: 27 mmol/L (ref 18–29)
CREATININE: 0.77 mg/dL (ref 0.76–1.27)
GFR calc non Af Amer: 94 mL/min/{1.73_m2} (ref >59)
GFR, EST AFRICAN AMERICAN: 109 mL/min/{1.73_m2} (ref >59)
Glucose: 161 mg/dL — ABNORMAL HIGH (ref 65–99)
Potassium: 4.4 mmol/L (ref 3.5–5.2)
Sodium: 138 mmol/L (ref 134–144)

## 2017-03-20 LAB — HEPATIC FUNCTION PANEL
ALK PHOS: 45 IU/L (ref 39–117)
ALT: 16 IU/L (ref 0–44)
AST: 18 IU/L (ref 0–40)
Albumin: 4.1 g/dL (ref 3.6–4.8)
BILIRUBIN, DIRECT: 0.14 mg/dL (ref 0.00–0.40)
Bilirubin Total: 0.4 mg/dL (ref 0.0–1.2)
Total Protein: 6.6 g/dL (ref 6.0–8.5)

## 2017-03-20 LAB — VITAMIN D 25 HYDROXY (VIT D DEFICIENCY, FRACTURES): VIT D 25 HYDROXY: 58.6 ng/mL (ref 30.0–100.0)

## 2017-07-07 ENCOUNTER — Encounter: Payer: Self-pay | Admitting: Family Medicine

## 2017-07-09 MED ORDER — GLUCOSE BLOOD VI STRP: ORAL_STRIP | 3 refills | Status: DC

## 2017-07-16 ENCOUNTER — Other Ambulatory Visit: Payer: Self-pay

## 2017-07-16 MED ORDER — ONETOUCH VERIO W/DEVICE KIT: 1.0000 | PACK | Freq: Two times a day (BID) | 0 refills | Status: DC

## 2017-07-19 ENCOUNTER — Other Ambulatory Visit: Payer: Self-pay | Admitting: *Deleted

## 2017-07-19 NOTE — Telephone Encounter (Signed)
Closing encounter done 07/16/17

## 2017-07-26 ENCOUNTER — Ambulatory Visit (INDEPENDENT_AMBULATORY_CARE_PROVIDER_SITE_OTHER): Payer: PPO | Admitting: Family Medicine

## 2017-07-26 ENCOUNTER — Encounter: Payer: Self-pay | Admitting: Family Medicine

## 2017-07-26 VITALS — BP 123/75 | HR 73 | Temp 98.5°F | Ht 71.0 in | Wt 173.0 lb

## 2017-07-26 DIAGNOSIS — E559 Vitamin D deficiency, unspecified: Secondary | ICD-10-CM

## 2017-07-26 DIAGNOSIS — E119 Type 2 diabetes mellitus without complications: Secondary | ICD-10-CM | POA: Diagnosis not present

## 2017-07-26 DIAGNOSIS — N400 Enlarged prostate without lower urinary tract symptoms: Secondary | ICD-10-CM | POA: Diagnosis not present

## 2017-07-26 DIAGNOSIS — W57XXXA Bitten or stung by nonvenomous insect and other nonvenomous arthropods, initial encounter: Secondary | ICD-10-CM | POA: Diagnosis not present

## 2017-07-26 DIAGNOSIS — Z1159 Encounter for screening for other viral diseases: Secondary | ICD-10-CM | POA: Diagnosis not present

## 2017-07-26 DIAGNOSIS — Z23 Encounter for immunization: Secondary | ICD-10-CM

## 2017-07-26 DIAGNOSIS — E7800 Pure hypercholesterolemia, unspecified: Secondary | ICD-10-CM | POA: Diagnosis not present

## 2017-07-26 DIAGNOSIS — L989 Disorder of the skin and subcutaneous tissue, unspecified: Secondary | ICD-10-CM

## 2017-07-26 LAB — BAYER DCA HB A1C WAIVED: HB A1C (BAYER DCA - WAIVED): 7.5 % — ABNORMAL HIGH (ref <7.0)

## 2017-07-26 MED ORDER — DOXYCYCLINE HYCLATE 100 MG PO TABS: 100.0000 mg | ORAL_TABLET | Freq: Two times a day (BID) | ORAL | 1 refills | Status: DC

## 2017-07-26 MED ORDER — DOXYCYCLINE HYCLATE 100 MG PO TABS: 100.0000 mg | ORAL_TABLET | Freq: Two times a day (BID) | ORAL | 0 refills | Status: DC

## 2017-07-26 NOTE — Patient Instructions (Addendum)
Medicare Annual Wellness Visit  [facility] and the medical providers at [facility] strive to bring you the best medical care.  In doing so we not only want to address your current medical conditions and concerns but also to detect new conditions early and prevent illness, disease and health-related problems.    Medicare offers a yearly Wellness Visit which allows our clinical staff to assess your need for preventative services including immunizations, lifestyle education, counseling to decrease risk of preventable diseases and screening for fall risk and other medical concerns.    This visit is provided free of charge (no copay) for all Medicare recipients. The clinical pharmacists at [facility] have begun to conduct these Wellness Visits which will also include a thorough review of all your medications.    As you primary medical provider recommend that you make an appointment for your Annual Wellness Visit if you have not done so already this year.  You may set up this appointment before you leave today or you may call back [phone]) and schedule an appointment.  Please make sure when you call that you mention that you are scheduling your Annual Wellness Visit with the clinical pharmacist so that the appointment may be made for the proper length of time.     Continue current medications. Continue good therapeutic lifestyle changes which include good diet and exercise. Fall precautions discussed with patient. If an FOBT was given today- please return it to our front desk. If you are over 32 years old - you may need Prevnar 85 or the adult Pneumonia vaccine.  **Flu shots are available--- please call and schedule a FLU-CLINIC appointment**  After your visit with [place] today you will receive a survey in the mail or online from [employer] regarding your care with [place]. Please take a moment to fill this out. Your feedback is very  important to [place] as you can help [place] better understand your patient needs as well as improve your experience and satisfaction. WE CARE ABOUT YOU!!!   Check yourself for ticks regularly and use deep [place] off as a preventative Continue to exercise and walk and watch diet and keep blood sugar under the best control possible Reschedule visit with dermatology Drink plenty of fluids and stay well hydrated

## 2017-07-26 NOTE — Progress Notes (Signed)
Subjective:    Patient ID: Peter Mathis, male    DOB: 26-Mar-1951, 66 y.o.   MRN: [id number]  HPI Pt here for follow up and management of chronic medical problems which includes diabetes and hyperlipidemia. He is taking medication regularly.The patient is doing well overall. Unfortunately he has had several tick bites with the last being 3-4 weeks ago. Otherwise there are no other complaints and he does not need any refills. He will get lab work done today and will be given an FOBT to return. He is also due to get his Pneumovax today. His vital signs are stable. He continues to take his medicines as mentioned regularly. The patient denies any chest pain or shortness of breath. He denies any trouble with swallowing heartburn indigestion nausea vomiting diarrhea or blood in the stool. He is up-to-date on his colonoscopies. He's passing his water without problems. He has made a special effort to lose some weight since the last visit through exercise and diet. He says that his blood sugars at home are running in the 1:15 to 125 range fasting and maybe 140 during the day. He will get his Pneumovax today.   Patient Active Problem List   Diagnosis Date Noted  . Erectile dysfunction due to arterial insufficiency 01/23/2016  . Right bundle branch block, history of 01/17/2014  . Hyperlipemia 07/12/2013  . Type II diabetes mellitus ([place]) 07/12/2013  . BPH (benign prostatic hyperplasia) 07/12/2013   Outpatient Encounter Prescriptions as of 07/26/2017  Medication Sig  . aspirin (ASPIRIN LOW DOSE) 81 MG EC tablet Take 81 mg by mouth daily.    [name] atorvastatin (LIPITOR) 80 MG tablet Take 1/2 tablet by mouth every night at bedtime  . azelastine (ASTELIN) 0.1 % nasal spray 2 sprays each nostril at bedtime  . Blood Glucose Monitoring Suppl (ONETOUCH VERIO) w/Device KIT 1 Stick by Does not apply route 2 (two) times daily.  . Cholecalciferol (VITAMIN D3) 5000 UNITS CAPS Take 1 capsule by mouth daily.   .  fluticasone (FLONASE) 50 MCG/ACT nasal spray Place 2 sprays into both nostrils daily.  [name] glucose blood (ONE TOUCH ULTRA TEST) test strip Test bid, Dx E11.9  . lisinopril (PRINIVIL,ZESTRIL) 5 MG tablet Take 1 tablet (5 mg total) by mouth daily.  . metFORMIN (GLUCOPHAGE) 500 MG tablet Take 1 tablet (500 mg total) by mouth 2 (two) times daily with a meal.  . Omega-3 Fatty Acids (FISH OIL) 1000 MG CAPS Take 2,000 mg by mouth daily.  . sitaGLIPtin (JANUVIA) 100 MG tablet Take 1 tablet (100 mg total) by mouth daily.   No facility-administered encounter medications on file as of 07/26/2017.       Review of Systems  Constitutional: Negative.   HENT: Negative.   Eyes: Negative.   Respiratory: Negative.   Cardiovascular: Negative.   Gastrointestinal: Negative.   Endocrine: Negative.   Genitourinary: Negative.   Musculoskeletal: Negative.   Skin: Negative.        Several tick bites this summer  Allergic/Immunologic: Negative.   Neurological: Negative.   Hematological: Negative.   Psychiatric/Behavioral: Negative.        Objective:   Physical Exam  Constitutional: He is oriented to person, place, and time. He appears well-developed and well-nourished. No distress.  The patient is pleasant and alert and looks much younger than his stated age.  HENT:  Head: Normocephalic and atraumatic.  Right Ear: External ear normal.  Left Ear: External ear normal.  Nose: Nose normal.  Mouth/Throat: Oropharynx is  clear and moist. No oropharyngeal exudate.  Eyes: Pupils are equal, round, and reactive to light. Conjunctivae and EOM are normal. Right eye exhibits no discharge. Left eye exhibits no discharge. No scleral icterus.  Neck: Normal range of motion. Neck supple. No thyromegaly present.  No bruits or thyromegaly  Cardiovascular: Normal rate, regular rhythm, normal heart sounds and intact distal pulses.   No murmur heard. The heart is 72/m with a regular rate and rhythm  Pulmonary/Chest: Effort  normal and breath sounds normal. No respiratory distress. He has no wheezes. He has no rales. He exhibits no tenderness.  No axillary adenopathy  Abdominal: Soft. Bowel sounds are normal. He exhibits no mass. There is no tenderness. There is no rebound and no guarding.  No liver or spleen enlargement no abdominal tenderness and no inguinal adenopathy  Musculoskeletal: Normal range of motion. He exhibits no edema.  Lymphadenopathy:    He has no cervical adenopathy.  Neurological: He is alert and oriented to person, place, and time. He has normal reflexes. No cranial nerve deficit.  Skin: Skin is warm and dry. No rash noted. No erythema.  Healing tick bite on ankle  Psychiatric: He has a normal mood and affect. His behavior is normal. Judgment and thought content normal.  Nursing note and vitals reviewed.   BP 123/75 (BP Location: Right Arm)   Pulse 73   Temp 98.5 F (36.9 C) (Oral)   Ht '5\' 11"'  (1.803 m)   Wt 173 lb (78.5 kg)   BMI 24.13 kg/m        Assessment & Plan:  1. Type 2 diabetes mellitus without complication, without long-term current use of insulin (HCC) -Continue with exercise and diet and weight loss - BMP8+EGFR - CBC with Differential/Platelet - Bayer DCA Hb A1c Waived  2. Vitamin D deficiency -Continue with current treatment pending results of lab work - CBC with Differential/Platelet - VITAMIN D 25 Hydroxy (Vit-D Deficiency, Fractures)  3. Pure hypercholesterolemia -Continue with aggressive therapeutic lifestyle changes and current treatment pending results of lab work - CBC with Differential/Platelet - Hepatic function panel - Lipid panel  4. Benign prostatic hyperplasia, unspecified whether lower urinary tract symptoms present -No complaints with voiding - CBC with Differential/Platelet  5. Encounter for hepatitis C screening test for low risk patient - Hepatitis C antibody  6. Tick bite, initial encounter -Doxycycline 100 mg twice daily with food  for 3 weeks with 1 refill because of frequent tick bites  Patient Instructions                       Medicare Annual Wellness Visit  [facility] and the medical providers at [facility] strive to bring you the best medical care.  In doing so we not only want to address your current medical conditions and concerns but also to detect new conditions early and prevent illness, disease and health-related problems.    Medicare offers a yearly Wellness Visit which allows our clinical staff to assess your need for preventative services including immunizations, lifestyle education, counseling to decrease risk of preventable diseases and screening for fall risk and other medical concerns.    This visit is provided free of charge (no copay) for all Medicare recipients. The clinical pharmacists at [facility] have begun to conduct these Wellness Visits which will also include a thorough review of all your medications.    As you primary medical provider recommend that you make an appointment for  your Annual Wellness Visit if you have not done so already this year.  You may set up this appointment before you leave today or you may call back ([phone]) and schedule an appointment.  Please make sure when you call that you mention that you are scheduling your Annual Wellness Visit with the clinical pharmacist so that the appointment may be made for the proper length of time.     Continue current medications. Continue good therapeutic lifestyle changes which include good diet and exercise. Fall precautions discussed with patient. If an FOBT was given today- please return it to our front desk. If you are over 54 years old - you may need Prevnar 97 or the adult Pneumonia vaccine.  **Flu shots are available--- please call and schedule a FLU-CLINIC appointment**  After your visit with [place] today you will receive a survey in the mail or online from [employer] regarding  your care with [place]. Please take a moment to fill this out. Your feedback is very important to [place] as you can help [place] better understand your patient needs as well as improve your experience and satisfaction. WE CARE ABOUT YOU!!!   Check yourself for ticks regularly and use [place] off as a preventative Continue to exercise and walk and watch diet and keep blood sugar under the best control possible Reschedule visit with dermatology Drink plenty of fluids and stay well hydrated    Arrie Senate MD

## 2017-07-27 ENCOUNTER — Encounter: Payer: Self-pay | Admitting: Family Medicine

## 2017-07-28 LAB — LYME AB/WESTERN BLOT REFLEX: Lyme IgG/IgM Ab: <0.91 {ISR} (ref 0.00–0.90)

## 2017-07-28 LAB — HEPATIC FUNCTION PANEL
ALBUMIN: 4.3 g/dL (ref 3.6–4.8)
ALT: 26 IU/L (ref 0–44)
AST: 20 IU/L (ref 0–40)
Alkaline Phosphatase: 42 IU/L (ref 39–117)
BILIRUBIN TOTAL: 0.4 mg/dL (ref 0.0–1.2)
Bilirubin, Direct: 0.14 mg/dL (ref 0.00–0.40)
TOTAL PROTEIN: 7 g/dL (ref 6.0–8.5)

## 2017-07-28 LAB — LIPID PANEL
Chol/HDL Ratio: 2.5 ratio (ref 0.0–5.0)
Cholesterol, Total: 95 mg/dL — ABNORMAL LOW (ref 100–199)
HDL: 38 mg/dL — ABNORMAL LOW (ref >39)
LDL Calculated: 36 mg/dL (ref 0–99)
Triglycerides: 105 mg/dL (ref 0–149)
VLDL CHOLESTEROL CAL: 21 mg/dL (ref 5–40)

## 2017-07-28 LAB — CBC WITH DIFFERENTIAL/PLATELET
BASOS: 0 %
Basophils Absolute: 0 10*3/uL (ref 0.0–0.2)
EOS (ABSOLUTE): 0.1 10*3/uL (ref 0.0–0.4)
EOS: 1 %
Hematocrit: 45.4 % (ref 37.5–51.0)
Hemoglobin: 15.2 g/dL (ref 13.0–17.7)
IMMATURE GRANS (ABS): 0 10*3/uL (ref 0.0–0.1)
IMMATURE GRANULOCYTES: 0 %
LYMPHS: 29 %
Lymphocytes Absolute: 2.2 10*3/uL (ref 0.7–3.1)
MCH: 29.6 pg (ref 26.6–33.0)
MCHC: 33.5 g/dL (ref 31.5–35.7)
MCV: 88 fL (ref 79–97)
Monocytes Absolute: 0.6 10*3/uL (ref 0.1–0.9)
Monocytes: 7 %
NEUTROS PCT: 63 %
Neutrophils Absolute: 4.7 10*3/uL (ref 1.4–7.0)
PLATELETS: 262 10*3/uL (ref 150–379)
RBC: 5.14 x10E6/uL (ref 4.14–5.80)
RDW: 13.8 % (ref 12.3–15.4)
WBC: 7.6 10*3/uL (ref 3.4–10.8)

## 2017-07-28 LAB — BMP8+EGFR
BUN/Creatinine Ratio: 18 (ref 10–24)
BUN: 16 mg/dL (ref 8–27)
CO2: 26 mmol/L (ref 20–29)
CREATININE: 0.9 mg/dL (ref 0.76–1.27)
Calcium: 9.3 mg/dL (ref 8.6–10.2)
Chloride: 99 mmol/L (ref 96–106)
GFR calc non Af Amer: 88 mL/min/{1.73_m2} (ref >59)
GFR, EST AFRICAN AMERICAN: 102 mL/min/{1.73_m2} (ref >59)
GLUCOSE: 163 mg/dL — AB (ref 65–99)
Potassium: 5.7 mmol/L — ABNORMAL HIGH (ref 3.5–5.2)
Sodium: 138 mmol/L (ref 134–144)

## 2017-07-28 LAB — HEPATITIS C ANTIBODY

## 2017-07-28 LAB — RMSF, IGG, IFA

## 2017-07-28 LAB — ROCKY MTN SPOTTED FVR ABS PNL(IGG+IGM)
RMSF IGG: POSITIVE — AB
RMSF IGM: 0.48 {index} (ref 0.00–0.89)

## 2017-07-28 LAB — VITAMIN D 25 HYDROXY (VIT D DEFICIENCY, FRACTURES): VIT D 25 HYDROXY: 65.2 ng/mL (ref 30.0–100.0)

## 2017-07-30 ENCOUNTER — Other Ambulatory Visit: Payer: PPO

## 2017-07-30 DIAGNOSIS — Z1212 Encounter for screening for malignant neoplasm of rectum: Secondary | ICD-10-CM

## 2017-08-02 LAB — FECAL OCCULT BLOOD, IMMUNOCHEMICAL: Fecal Occult Bld: NEGATIVE

## 2017-08-05 DIAGNOSIS — L820 Inflamed seborrheic keratosis: Secondary | ICD-10-CM | POA: Diagnosis not present

## 2017-08-05 DIAGNOSIS — L821 Other seborrheic keratosis: Secondary | ICD-10-CM | POA: Diagnosis not present

## 2017-09-15 ENCOUNTER — Other Ambulatory Visit: Payer: Self-pay | Admitting: Family Medicine

## 2017-11-29 ENCOUNTER — Encounter: Payer: Self-pay | Admitting: Family Medicine

## 2017-11-29 ENCOUNTER — Ambulatory Visit: Payer: PPO | Admitting: Family Medicine

## 2017-11-29 VITALS — BP 127/74 | HR 70 | Temp 98.3°F | Ht 71.0 in

## 2017-11-29 DIAGNOSIS — N400 Enlarged prostate without lower urinary tract symptoms: Secondary | ICD-10-CM | POA: Diagnosis not present

## 2017-11-29 DIAGNOSIS — E119 Type 2 diabetes mellitus without complications: Secondary | ICD-10-CM | POA: Diagnosis not present

## 2017-11-29 DIAGNOSIS — E559 Vitamin D deficiency, unspecified: Secondary | ICD-10-CM

## 2017-11-29 DIAGNOSIS — E7800 Pure hypercholesterolemia, unspecified: Secondary | ICD-10-CM

## 2017-11-29 DIAGNOSIS — Z0000 Encounter for general adult medical examination without abnormal findings: Secondary | ICD-10-CM

## 2017-11-29 DIAGNOSIS — N5201 Erectile dysfunction due to arterial insufficiency: Secondary | ICD-10-CM

## 2017-11-29 DIAGNOSIS — J069 Acute upper respiratory infection, unspecified: Secondary | ICD-10-CM

## 2017-11-29 LAB — BAYER DCA HB A1C WAIVED: HB A1C: 7.2 % — AB (ref <7.0)

## 2017-11-29 NOTE — Patient Instructions (Addendum)
Medicare Annual Wellness Visit  [facility] and the medical providers at [facility] strive to bring you the best medical care.  In doing so we not only want to address your current medical conditions and concerns but also to detect new conditions early and prevent illness, disease and health-related problems.    Medicare offers a yearly Wellness Visit which allows our clinical staff to assess your need for preventative services including immunizations, lifestyle education, counseling to decrease risk of preventable diseases and screening for fall risk and other medical concerns.    This visit is provided free of charge (no copay) for all Medicare recipients. The clinical pharmacists at [facility] have begun to conduct these Wellness Visits which will also include a thorough review of all your medications.    As you primary medical provider recommend that you make an appointment for your Annual Wellness Visit if you have not done so already this year.  You may set up this appointment before you leave today or you may call back ([phone]) and schedule an appointment.  Please make sure when you call that you mention that you are scheduling your Annual Wellness Visit with the clinical pharmacist so that the appointment may be made for the proper length of time.     Continue current medications. Continue good therapeutic lifestyle changes which include good diet and exercise. Fall precautions discussed with patient. If an FOBT was given today- please return it to our front desk. If you are over 22 years old - you may need Prevnar 44 or the adult Pneumonia vaccine.  **Flu shots are available--- please call and schedule a FLU-CLINIC appointment**  After your visit with [place] today you will receive a survey in the mail or online from [employer] regarding your care with [place]. Please take a moment to fill this out. Your feedback is very  important to [place] as you can help [place] better understand your patient needs as well as improve your experience and satisfaction. WE CARE ABOUT YOU!!!   Stay active physically and continue to watch your diet closely and keep the blood sugar under the best control possible Drink plenty of fluids and stay well-hydrated Take Mucinex maximum strength, blue and white in color, 1 twice daily for cough and congestion with a large glass of water Use nasal saline and continue to use Flonase 1-2 sprays nightly in each nostril Do not use overhead fan If any worsening condition get back in touch with [place] as we may need an antibiotic.

## 2017-11-29 NOTE — Progress Notes (Signed)
Subjective:    Patient ID: Peter Mathis, male    DOB: 05/21/1951, 66 y.o.   MRN: [id number]  HPI Patient is here today for annual wellness exam and follow up of chronic medical problems which includes hyperlipidemia and diabetes. He is taking medication regularly.  The patient comes in today with complaints of head congestion for his regular physical exam.  He was given samples of Januvia today.  He will need his feet checked because of diabetes.  He has no specific complaints otherwise.  Patient has diabetes hyperlipidemia vitamin D deficiency allergic rhinitis.  Both of his parents are still living and his mother has circulation issues with amputation below the knee.  The patient today is pleasant and denies any problems.  He feels that his cold symptoms his head congestion are getting better and the drainage is clearing.  He will continue with his Claritin and Flonase and nasal saline.  He denies any chest pain or shortness of breath.  He denies any trouble with swallowing heartburn indigestion nausea vomiting diarrhea or blood in the stool or change in bowel habits.  He is passing his water without problems.  He is due to get his next eye exam next week and he gets these yearly.  His last colonoscopy was last year and is not due another one for 10 years.     Patient Active Problem List   Diagnosis Date Noted  . Erectile dysfunction due to arterial insufficiency 01/23/2016  . Right bundle branch block, history of 01/17/2014  . Hyperlipemia 07/12/2013  . Type II diabetes mellitus ([place]) 07/12/2013  . BPH (benign prostatic hyperplasia) 07/12/2013   Outpatient Encounter Medications as of 11/29/2017  Medication Sig  . aspirin (ASPIRIN LOW DOSE) 81 MG EC tablet Take 81 mg by mouth daily.    [name] atorvastatin (LIPITOR) 80 MG tablet TAKE 1/2 TABLET BY MOUTH AT BEDTIME  . azelastine (ASTELIN) 0.1 % nasal spray 2 sprays each nostril at bedtime  . Blood Glucose Monitoring Suppl (ONETOUCH VERIO)  w/Device KIT 1 Stick by Does not apply route 2 (two) times daily.  . Cholecalciferol (VITAMIN D3) 5000 UNITS CAPS Take 1 capsule by mouth daily.   . fluticasone (FLONASE) 50 MCG/ACT nasal spray Place 2 sprays into both nostrils daily.  [name] glucose blood (ONE TOUCH ULTRA TEST) test strip Test bid, Dx E11.9  . lisinopril (PRINIVIL,ZESTRIL) 5 MG tablet Take 1 tablet (5 mg total) by mouth daily.  . metFORMIN (GLUCOPHAGE) 500 MG tablet Take 1 tablet (500 mg total) by mouth 2 (two) times daily with a meal.  . Omega-3 Fatty Acids (FISH OIL) 1000 MG CAPS Take 2,000 mg by mouth daily.  . sitaGLIPtin (JANUVIA) 100 MG tablet Take 1 tablet (100 mg total) by mouth daily.  . [DISCONTINUED] doxycycline (VIBRA-TABS) 100 MG tablet Take 1 tablet (100 mg total) by mouth 2 (two) times daily. 1 po bid   No facility-administered encounter medications on file as of 11/29/2017.      Review of Systems  Constitutional: Negative.   HENT: Positive for congestion ("head cold").   Eyes: Negative.   Respiratory: Negative.   Cardiovascular: Negative.   Gastrointestinal: Negative.   Endocrine: Negative.   Genitourinary: Negative.   Musculoskeletal: Negative.   Skin: Negative.   Allergic/Immunologic: Negative.   Neurological: Negative.   Hematological: Negative.   Psychiatric/Behavioral: Negative.        Objective:   Physical Exam  Constitutional: He is oriented to person, place, and time. He appears  well-developed and well-nourished. No distress.  The patient is pleasant and doing well with his health  HENT:  Head: Normocephalic and atraumatic.  Right Ear: External ear normal.  Left Ear: External ear normal.  Mouth/Throat: Oropharynx is clear and moist. No oropharyngeal exudate.  Slight nasal congestion and redness  Eyes: Conjunctivae and EOM are normal. Pupils are equal, round, and reactive to light. Right eye exhibits no discharge. Left eye exhibits no discharge. No scleral icterus.  Neck: Normal range of  motion. Neck supple. No thyromegaly present.  No bruits thyromegaly or anterior cervical adenopathy  Cardiovascular: Normal rate, regular rhythm, normal heart sounds and intact distal pulses.  No murmur heard. The heart has a regular rate and rhythm at 72/min  Pulmonary/Chest: Effort normal and breath sounds normal. No respiratory distress. He has no wheezes. He has no rales. He exhibits no tenderness.  Clear anteriorly and posteriorly and no axillary adenopathy  Abdominal: Soft. Bowel sounds are normal. He exhibits no mass. There is no tenderness. There is no rebound and no guarding.  No liver or spleen enlargement no masses no bruits no inguinal adenopathy and no tenderness  Genitourinary: Rectum normal and penis normal.  Genitourinary Comments: The prostate is slightly enlarged without any lumps or masses.  The rectal exam was negative for masses.  External genitalia were normal and no inguinal hernias were palpable..  There was no inguinal adenopathy.  Musculoskeletal: Normal range of motion. He exhibits no edema.  Lymphadenopathy:    He has no cervical adenopathy.  Neurological: He is alert and oriented to person, place, and time. He has normal reflexes. No cranial nerve deficit.  Skin: Skin is warm and dry. No rash noted.  Psychiatric: He has a normal mood and affect. His behavior is normal. Judgment and thought content normal.  Nursing note and vitals reviewed.  BP 127/74 (BP Location: Left Arm)   Pulse 70   Temp 98.3 F (36.8 C) (Oral)   Ht _0  (1.803 m)   BMI 24.13 kg/m   Patient is due soon to get his next shingles shot he is getting this from the [facility].      Assessment & Plan:  1. Type 2 diabetes mellitus without complication, without long-term current use of insulin (HCC) -The patient indicates that his blood sugars at home have been running between 120 and 140 fasting.  2 hours after eating as high as 150.  He will continue current treatment pending results of  lab work - CBC with Differential/Platelet - BMP8+EGFR - Bayer DCA Hb A1c Waived  2. Vitamin D deficiency -Continue vitamin D replacement pending results of lab work - CBC with Differential/Platelet - VITAMIN D 25 Hydroxy (Vit-D Deficiency, Fractures)  3. Pure hypercholesterolemia -Continue with current treatment and aggressive therapeutic lifestyle changes pending results of lab work - CBC with Differential/Platelet - Lipid panel - Hepatic function panel  4. Benign prostatic hyperplasia, unspecified whether lower urinary tract symptoms present -No complaints with voiding today. - CBC with Differential/Platelet - PSA, total and free - Urinalysis, Complete  5. Annual physical exam -Patient does have an upcoming eye exam. - CBC with Differential/Platelet - BMP8+EGFR - Lipid panel - VITAMIN D 25 Hydroxy (Vit-D Deficiency, Fractures) - PSA, total and free - Hepatic function panel - Urinalysis, Complete - Bayer DCA Hb A1c Waived  6. Viral URI -Continue with Flonase nasal saline Mucinex and fluids.  7. Erectile dysfunction due to arterial insufficiency -No complaints today with erectile dysfunction.  8. Benign prostatic  hyperplasia without lower urinary tract symptoms -No complaints with his BPH.  Patient Instructions                       Medicare Annual Wellness Visit  [employer] and the medical providers at [facility] strive to bring you the best medical care.  In doing so we not only want to address your current medical conditions and concerns but also to detect new conditions early and prevent illness, disease and health-related problems.    Medicare offers a yearly Wellness Visit which allows our clinical staff to assess your need for preventative services including immunizations, lifestyle education, counseling to decrease risk of preventable diseases and screening for fall risk and other medical concerns.    This visit is provided free of  charge (no copay) for all Medicare recipients. The clinical pharmacists at [facility] have begun to conduct these Wellness Visits which will also include a thorough review of all your medications.    As you primary medical provider recommend that you make an appointment for your Annual Wellness Visit if you have not done so already this year.  You may set up this appointment before you leave today or you may call back ([phone]) and schedule an appointment.  Please make sure when you call that you mention that you are scheduling your Annual Wellness Visit with the clinical pharmacist so that the appointment may be made for the proper length of time.     Continue current medications. Continue good therapeutic lifestyle changes which include good diet and exercise. Fall precautions discussed with patient. If an FOBT was given today- please return it to our front desk. If you are over 18 years old - you may need Prevnar 64 or the adult Pneumonia vaccine.  **Flu shots are available--- please call and schedule a FLU-CLINIC appointment**  After your visit with [place] today you will receive a survey in the mail or online from [employer] regarding your care with [place]. Please take a moment to fill this out. Your feedback is very important to [place] as you can help [place] better understand your patient needs as well as improve your experience and satisfaction. WE CARE ABOUT YOU!!!   Stay active physically and continue to watch your diet closely and keep the blood sugar under the best control possible Drink plenty of fluids and stay well-hydrated Take Mucinex maximum strength, blue and white in color, 1 twice daily for cough and congestion with a large glass of water Use nasal saline and continue to use Flonase 1-2 sprays nightly in each nostril Do not use overhead fan If any worsening condition get back in touch with [place] as we may need an antibiotic.  Arrie Senate MD

## 2017-11-30 LAB — HEPATIC FUNCTION PANEL
ALBUMIN: 4.2 g/dL (ref 3.6–4.8)
ALT: 20 IU/L (ref 0–44)
AST: 18 IU/L (ref 0–40)
Alkaline Phosphatase: 48 IU/L (ref 39–117)
BILIRUBIN TOTAL: 0.4 mg/dL (ref 0.0–1.2)
BILIRUBIN, DIRECT: 0.17 mg/dL (ref 0.00–0.40)
TOTAL PROTEIN: 6.8 g/dL (ref 6.0–8.5)

## 2017-11-30 LAB — CBC WITH DIFFERENTIAL/PLATELET
BASOS: 0 %
Basophils Absolute: 0 10*3/uL (ref 0.0–0.2)
EOS (ABSOLUTE): 0.1 10*3/uL (ref 0.0–0.4)
EOS: 1 %
HEMOGLOBIN: 14.9 g/dL (ref 13.0–17.7)
Hematocrit: 44.5 % (ref 37.5–51.0)
IMMATURE GRANS (ABS): 0 10*3/uL (ref 0.0–0.1)
IMMATURE GRANULOCYTES: 0 %
LYMPHS: 30 %
Lymphocytes Absolute: 2.2 10*3/uL (ref 0.7–3.1)
MCH: 30 pg (ref 26.6–33.0)
MCHC: 33.5 g/dL (ref 31.5–35.7)
MCV: 90 fL (ref 79–97)
MONOS ABS: 0.6 10*3/uL (ref 0.1–0.9)
Monocytes: 9 %
NEUTROS ABS: 4.3 10*3/uL (ref 1.4–7.0)
Neutrophils: 60 %
PLATELETS: 264 10*3/uL (ref 150–379)
RBC: 4.96 x10E6/uL (ref 4.14–5.80)
RDW: 13.4 % (ref 12.3–15.4)
WBC: 7.2 10*3/uL (ref 3.4–10.8)

## 2017-11-30 LAB — BMP8+EGFR
BUN / CREAT RATIO: 18 (ref 10–24)
BUN: 15 mg/dL (ref 8–27)
CALCIUM: 9.1 mg/dL (ref 8.6–10.2)
CHLORIDE: 100 mmol/L (ref 96–106)
CO2: 25 mmol/L (ref 20–29)
CREATININE: 0.83 mg/dL (ref 0.76–1.27)
GFR calc Af Amer: 105 mL/min/{1.73_m2} (ref >59)
GFR calc non Af Amer: 91 mL/min/{1.73_m2} (ref >59)
GLUCOSE: 149 mg/dL — AB (ref 65–99)
Potassium: 4.6 mmol/L (ref 3.5–5.2)
Sodium: 139 mmol/L (ref 134–144)

## 2017-11-30 LAB — LIPID PANEL
CHOLESTEROL TOTAL: 101 mg/dL (ref 100–199)
Chol/HDL Ratio: 2.8 ratio (ref 0.0–5.0)
HDL: 36 mg/dL — AB (ref >39)
LDL CALC: 47 mg/dL (ref 0–99)
TRIGLYCERIDES: 89 mg/dL (ref 0–149)
VLDL CHOLESTEROL CAL: 18 mg/dL (ref 5–40)

## 2017-11-30 LAB — VITAMIN D 25 HYDROXY (VIT D DEFICIENCY, FRACTURES): VIT D 25 HYDROXY: 67.8 ng/mL (ref 30.0–100.0)

## 2017-11-30 LAB — PSA, TOTAL AND FREE
PSA FREE PCT: 57.5 %
PSA, Free: 0.46 ng/mL
Prostate Specific Ag, Serum: 0.8 ng/mL (ref 0.0–4.0)

## 2017-12-01 ENCOUNTER — Ambulatory Visit (INDEPENDENT_AMBULATORY_CARE_PROVIDER_SITE_OTHER): Payer: PPO | Admitting: *Deleted

## 2017-12-01 VITALS — BP 139/77 | HR 68 | Ht 70.0 in | Wt 181.0 lb

## 2017-12-01 DIAGNOSIS — Z0000 Encounter for general adult medical examination without abnormal findings: Secondary | ICD-10-CM

## 2017-12-01 NOTE — Progress Notes (Signed)
Subjective:   Peter Mathis is a 66 y.o. male who presents for an Initial Medicare Annual Wellness Visit.  Mr. 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  He lives with his wife of 22 years.  He has 3 daughters, and 5 grandchildren.  Mr. Pretlow enjoys farming and watching sports, and attending church.  He spends a significant amount of time caring for his parents who are in their 17s.  He feels that his health is about the same as it was last year.  He denies any hospitalizations, emergency room visits, or surgeries in the past year.   Review of Systems  All negative      Objective:    Today's Vitals   12/01/17 1320  BP: 139/77  Pulse: 68  Weight: 181 lb (82.1 kg)  Height: '5\' 10"'  (1.778 m)   Body mass index is 25.97 kg/m.  Advanced Directives 12/01/2017 07/02/2016  Does Patient Have a Medical Advance Directive? No No  Would patient like information on creating a medical advance directive? Yes (ED - Information included in AVS) No - patient declined information    Current Medications (verified) Outpatient Encounter Medications as of 12/01/2017  Medication Sig  . aspirin (ASPIRIN LOW DOSE) 81 MG EC tablet Take 81 mg by mouth daily.    [name] atorvastatin (LIPITOR) 80 MG tablet TAKE 1/2 TABLET BY MOUTH AT BEDTIME  . azelastine (ASTELIN) 0.1 % nasal spray 2 sprays each nostril at bedtime  . Blood Glucose Monitoring Suppl (ONETOUCH VERIO) w/Device KIT 1 Stick by Does not apply route 2 (two) times daily.  . Cholecalciferol (VITAMIN D3) 5000 UNITS CAPS Take 1 capsule by mouth daily.   . fluticasone (FLONASE) 50 MCG/ACT nasal spray Place 2 sprays into both nostrils daily.  [name] glucose blood (ONE TOUCH ULTRA TEST) test strip Test bid, Dx E11.9  . lisinopril (PRINIVIL,ZESTRIL) 5 MG tablet Take 1 tablet (5 mg total) by mouth daily.   . metFORMIN (GLUCOPHAGE) 500 MG tablet Take 1 tablet (500 mg total) by mouth 2 (two) times daily with a meal.  . Omega-3 Fatty Acids (FISH OIL) 1000 MG CAPS Take 2,000 mg by mouth daily.  . sitaGLIPtin (JANUVIA) 100 MG tablet Take 1 tablet (100 mg total) by mouth daily.   No facility-administered encounter medications on file as of 12/01/2017.     Allergies (verified) Penicillins   History: Past Medical History:  Diagnosis Date  . Chest discomfort 1989  . Diverticulosis   . DM type 2 (diabetes mellitus, type 2) ([place])   . Heart murmur   . Hyperlipidemia    Past Surgical History:  Procedure Laterality Date  . TONSILLECTOMY AND ADENOIDECTOMY  01/1955   Family History  Problem Relation Age of Onset  . Diabetes Mother   . Heart attack Father   . Colon cancer Neg Hx    Social History   Socioeconomic History  . Marital status: Married    Spouse name: None  . Number of children: None  . Years of education: None  . Highest education level: None  Social Needs  . Financial resource strain: None  . Food insecurity - worry: None  . Food insecurity - inability: None  . Transportation needs - medical: None  . Transportation needs - non-medical: None  Occupational History  . None  Tobacco  Use  . Smoking status: Never Smoker  . Smokeless tobacco: Never Used  Substance and Sexual Activity  . Alcohol use: No  . Drug use: No  . Sexual activity: None  Other Topics Concern  . None  Social History Narrative  . None   Tobacco Counseling Counseling given: No                                  Activities of Daily Living In your present state of health, do you have any difficulty performing the following activities: 12/01/2017  Hearing? N  Vision? N  Difficulty concentrating or making decisions? N  Walking or climbing stairs? N  Dressing or bathing? N  Doing errands, shopping? N  Preparing Food and eating ? N  Using the Toilet? N  In the past six months,  have you accidently leaked urine? N  Do you have problems with loss of bowel control? N  Managing your Medications? N  Managing your Finances? N  Housekeeping or managing your Housekeeping? N  Some recent data might be hidden       Immunizations and Health Maintenance Immunization History  Administered Date(s) Administered  . Influenza, High Dose Seasonal PF 11/15/2015, 10/21/2016  . Pneumococcal Conjugate-13 05/02/2015  . Pneumococcal Polysaccharide-23 07/26/2017  . Tdap 05/05/2016   Health Maintenance Due  Topic Date Due  . OPHTHALMOLOGY EXAM  10/28/2017    Patient Care Team: Chipper Herb, MD as PCP - General (Family Medicine) Larey Dresser, MD as Consulting Physician (Cardiology)  Indicate any recent Medical Services you may have received from other than [facility] providers in the past year (date may be approximate).    Assessment:   This is a routine wellness examination for [place].   Hearing/Vision screen Patient is seen by Dr. Buford Dresser at [facility] in [place], he has an appointment in the next few weeks.  Asked him to have copy of note sent to our office.   Wears reading glasses No hearing deficit noted  Dietary issues and exercise activities discussed: Current Exercise Habits: Home exercise routine, Type of exercise: strength training/weights;treadmill, Time (Minutes): 60, Frequency (Times/Week): 3, Weekly Exercise (Minutes/Week): 180, Intensity: Moderate  Goals    . DIET - INCREASE WATER INTAKE     Increase water intake to 6-8 glasses per day.    . Exercise 150 min/wk Moderate Activity     Join silver sneakers and attend 3 times per week for 1 hour.      Depression Screen PHQ 2/9 Scores 12/01/2017 11/29/2017 03/11/2017 10/21/2016  PHQ - 2 Score 0 0 0 0    Fall Risk Fall Risk  12/01/2017 12/01/2017 11/29/2017 03/11/2017 10/21/2016  Falls in the past year? No Yes No No No      Cognitive Function: MMSE - Mini Mental State Exam 12/01/2017    Orientation to time 5  Orientation to Place 5  Registration 3  Attention/ Calculation 5  Recall 3  Language- name 2 objects 2  Language- repeat 1  Language- follow 3 step command 3  Language- read & follow direction 1  Write a sentence 1  Copy design 1  Total score 30        Screening Tests Health Maintenance  Topic Date Due  . OPHTHALMOLOGY EXAM  10/28/2017  . HEMOGLOBIN A1C  05/30/2018  . COLON CANCER SCREENING ANNUAL FOBT  07/30/2018  . FOOT EXAM  11/29/2018  . TETANUS/TDAP  05/05/2026  . INFLUENZA VACCINE  Completed  . Hepatitis C Screening  Completed  . PNA vac Low Risk Adult  Completed   Scheduled for diabetic eye exam in the next few weeks [facility]- requested [facility]       Plan:     Work increasing water intake to 6-8 glasses per day.   Consider joining Silver Sneakers and workout 3 times per week for 1 hour each session.  After eye exam ask Dr. Eusebio Me office to send [facility] a copy of results  After receiving 2nd Shingrix vaccine please bring a copy of the vaccine record to [facility] to be documented in you chart  Review information given on Advanced Directives, if completed, bring a copy to [facility] for your chart   I have personally reviewed and noted the following in the patient's chart:   . Medical and social history . Use of alcohol, tobacco or illicit drugs  . Current medications and supplements . Functional ability and status . Nutritional status . Physical activity . Advanced directives . List of other physicians . Hospitalizations, surgeries, and ER visits in previous 12 months . Vitals . Screenings to include cognitive, depression, and falls . Referrals and appointments  In addition, I have reviewed and discussed with patient certain preventive protocols, quality metrics, and best practice recommendations. A written personalized care plan for preventive services as well as general preventive health recommendations  were provided to patient.     Evamae Rowen M, RN   12/01/2017   I have reviewed and agree with the above AWV documentation.  Arrie Senate MD

## 2017-12-01 NOTE — Patient Instructions (Signed)
Please work on increasing your water intake to 6-8 glasses per day.   Consider joining Silver Sneakers and workout 3 times per week for 1 hour each session.  When you have your eye exam please ask their office to send [place] a copy of your results  After you receive your 2nd Shingrix vaccine please bring a copy of the vaccine record to our office to be documented in you chart  Review information given on Advanced Directives, if you complete these, please bring a copy to our office for your chart  Thank you for coming in for your Annual Wellness Visit today!!   Preventive Care 65 Years and Older, Male Preventive care refers to lifestyle choices and visits with your health care provider that can promote health and wellness. What does preventive care include?  A yearly physical exam. This is also called an annual well check.  Dental exams once or twice a year.  Routine eye exams. Ask your health care provider how often you should have your eyes checked.  Personal lifestyle choices, including: ? Daily care of your teeth and gums. ? Regular physical activity. ? Eating a healthy diet. ? Avoiding tobacco and drug use. ? Limiting alcohol use. ? Practicing safe sex. ? Taking low doses of aspirin every day. ? Taking vitamin and mineral supplements as recommended by your health care provider. What happens during an annual well check? The services and screenings done by your health care provider during your annual well check will depend on your age, overall health, lifestyle risk factors, and family history of disease. Counseling Your health care provider may ask you questions about your:  Alcohol use.  Tobacco use.  Drug use.  Emotional well-being.  Home and relationship well-being.  Sexual activity.  Eating habits.  History of falls.  Memory and ability to understand (cognition).  Work and work Statistician.  Screening You may have the following tests or  measurements:  Height, weight, and BMI.  Blood pressure.  Lipid and cholesterol levels. These may be checked every 5 years, or more frequently if you are over 36 years old.  Skin check.  Lung cancer screening. You may have this screening every year starting at age 68 if you have a 30-pack-year history of smoking and currently smoke or have quit within the past 15 years.  Fecal occult blood test (FOBT) of the stool. You may have this test every year starting at age 63.  Flexible sigmoidoscopy or colonoscopy. You may have a sigmoidoscopy every 5 years or a colonoscopy every 10 years starting at age 35.  Prostate cancer screening. Recommendations will vary depending on your family history and other risks.  Hepatitis C blood test.  Hepatitis B blood test.  Sexually transmitted disease (STD) testing.  Diabetes screening. This is done by checking your blood sugar (glucose) after you have not eaten for a while (fasting). You may have this done every 1-3 years.  Abdominal aortic aneurysm (AAA) screening. You may need this if you are a current or former smoker.  Osteoporosis. You may be screened starting at age 81 if you are at high risk.  Talk with your health care provider about your test results, treatment options, and if necessary, the need for more tests. Vaccines Your health care provider may recommend certain vaccines, such as:  Influenza vaccine. This is recommended every year.  Tetanus, diphtheria, and acellular pertussis (Tdap, Td) vaccine. You may need a Td booster every 10 years.  Varicella vaccine. You may need  this if you have not been vaccinated.  Zoster vaccine. You may need this after age 35.  Measles, mumps, and rubella (MMR) vaccine. You may need at least one dose of MMR if you were born in 1957 or later. You may also need a second dose.  Pneumococcal 13-valent conjugate (PCV13) vaccine. One dose is recommended after age 94.  Pneumococcal polysaccharide  (PPSV23) vaccine. One dose is recommended after age 70.  Meningococcal vaccine. You may need this if you have certain conditions.  Hepatitis A vaccine. You may need this if you have certain conditions or if you travel or work in places where you may be exposed to hepatitis A.  Hepatitis B vaccine. You may need this if you have certain conditions or if you travel or work in places where you may be exposed to hepatitis B.  Haemophilus influenzae type b (Hib) vaccine. You may need this if you have certain risk factors.  Talk to your health care provider about which screenings and vaccines you need and how often you need them. This information is not intended to replace advice given to you by your health care provider. Make sure you discuss any questions you have with your health care provider. Document Released: 01/10/2016 Document Revised: 09/02/2016 Document Reviewed: 10/15/2015 Elsevier Interactive Patient Education  2017 [employer].

## 2017-12-10 DIAGNOSIS — H527 Unspecified disorder of refraction: Secondary | ICD-10-CM | POA: Diagnosis not present

## 2017-12-11 ENCOUNTER — Other Ambulatory Visit: Payer: Self-pay | Admitting: Family Medicine

## 2017-12-14 ENCOUNTER — Other Ambulatory Visit: Payer: Self-pay | Admitting: Family Medicine

## 2018-01-26 ENCOUNTER — Other Ambulatory Visit: Payer: Self-pay | Admitting: *Deleted

## 2018-01-26 MED ORDER — SITAGLIPTIN PHOSPHATE 100 MG PO TABS: 100.0000 mg | ORAL_TABLET | Freq: Every day | ORAL | 0 refills | Status: DC

## 2018-03-11 ENCOUNTER — Other Ambulatory Visit: Payer: Self-pay | Admitting: *Deleted

## 2018-03-11 MED ORDER — METFORMIN HCL 500 MG PO TABS: 500.0000 mg | ORAL_TABLET | Freq: Two times a day (BID) | ORAL | 0 refills | Status: DC

## 2018-03-11 NOTE — Telephone Encounter (Signed)
Next Ov 04/14/18

## 2018-04-14 ENCOUNTER — Ambulatory Visit: Payer: PPO | Admitting: Family Medicine

## 2018-04-23 ENCOUNTER — Other Ambulatory Visit: Payer: Self-pay | Admitting: Family Medicine

## 2018-04-27 ENCOUNTER — Ambulatory Visit (INDEPENDENT_AMBULATORY_CARE_PROVIDER_SITE_OTHER): Payer: PPO | Admitting: Family Medicine

## 2018-04-27 ENCOUNTER — Ambulatory Visit (INDEPENDENT_AMBULATORY_CARE_PROVIDER_SITE_OTHER): Payer: PPO

## 2018-04-27 ENCOUNTER — Encounter: Payer: Self-pay | Admitting: Family Medicine

## 2018-04-27 VITALS — BP 123/74 | HR 73 | Temp 97.4°F | Ht 70.0 in | Wt 171.0 lb

## 2018-04-27 DIAGNOSIS — E7800 Pure hypercholesterolemia, unspecified: Secondary | ICD-10-CM

## 2018-04-27 DIAGNOSIS — E559 Vitamin D deficiency, unspecified: Secondary | ICD-10-CM

## 2018-04-27 DIAGNOSIS — N400 Enlarged prostate without lower urinary tract symptoms: Secondary | ICD-10-CM

## 2018-04-27 DIAGNOSIS — E119 Type 2 diabetes mellitus without complications: Secondary | ICD-10-CM | POA: Diagnosis not present

## 2018-04-27 LAB — BAYER DCA HB A1C WAIVED: HB A1C (BAYER DCA - WAIVED): 7.4 % — ABNORMAL HIGH (ref <7.0)

## 2018-04-27 NOTE — Patient Instructions (Addendum)
Medicare Annual Wellness Visit  [facility] and the medical providers at [facility] strive to bring you the best medical care.  In doing so we not only want to address your current medical conditions and concerns but also to detect new conditions early and prevent illness, disease and health-related problems.    Medicare offers a yearly Wellness Visit which allows our clinical staff to assess your need for preventative services including immunizations, lifestyle education, counseling to decrease risk of preventable diseases and screening for fall risk and other medical concerns.    This visit is provided free of charge (no copay) for all Medicare recipients. The clinical pharmacists at [facility] have begun to conduct these Wellness Visits which will also include a thorough review of all your medications.    As you primary medical provider recommend that you make an appointment for your Annual Wellness Visit if you have not done so already this year.  You may set up this appointment before you leave today or you may call back ([phone]) and schedule an appointment.  Please make sure when you call that you mention that you are scheduling your Annual Wellness Visit with the clinical pharmacist so that the appointment may be made for the proper length of time.     Continue current medications. Continue good therapeutic lifestyle changes which include good diet and exercise. Fall precautions discussed with patient. If an FOBT was given today- please return it to our front desk. If you are over 102 years old - you may need Prevnar 42 or the adult Pneumonia vaccine.  **Flu shots are available--- please call and schedule a FLU-CLINIC appointment**  After your visit with [place] today you will receive a survey in the mail or online from [employer] regarding your care with [place]. Please take a moment to fill this out. Your feedback is very  important to [place] as you can help [place] better understand your patient needs as well as improve your experience and satisfaction. WE CARE ABOUT YOU!!!   Continue aggressive therapeutic lifestyle changes Continue with allergy medicines Check for ticks regularly Use deep Sherral Hammers off Get eye exams yearly We will call with lab work results as soon as these results become available

## 2018-04-27 NOTE — Progress Notes (Signed)
Subjective:    Patient ID: Peter Mathis, male    DOB: 12/17/1951, 67 y.o.   MRN: [id number]  HPI Pt here for follow up and management of chronic medical problems which includes diabetes and hyperlipidemia. He is taking medication regularly.  Patient is doing well overall with no specific complaints.  He is up-to-date on his eye exams and will get a urine microalbumin today.  He will also get lab work today and a chest x-ray today.  Both of his parents are still living.  The patient is doing well overall.  He has been very active and has lost 10 pounds since the last visit and I am proud of him for doing this.  He denies any chest pain pressure tightness or shortness of breath.  He denies any trouble with his stomach including nausea vomiting diarrhea blood in the stool or black tarry bowel movements or change in bowel habits.  He had a colonoscopy about 2 years ago and everything was normal and there is no family history of colon cancer.  He is passing his water without problems.  He is up-to-date on his eye exams.    Patient Active Problem List   Diagnosis Date Noted  . Erectile dysfunction due to arterial insufficiency 01/23/2016  . Right bundle branch block, history of 01/17/2014  . Hyperlipemia 07/12/2013  . Type II diabetes mellitus ([place]) 07/12/2013  . Benign prostatic hyperplasia without lower urinary tract symptoms 07/12/2013   Outpatient Encounter Medications as of 04/27/2018  Medication Sig  . aspirin (ASPIRIN LOW DOSE) 81 MG EC tablet Take 81 mg by mouth daily.    [name] atorvastatin (LIPITOR) 80 MG tablet TAKE 1/2 TABLET BY MOUTH AT BEDTIME  . azelastine (ASTELIN) 0.1 % nasal spray 2 sprays each nostril at bedtime  . Blood Glucose Monitoring Suppl (ONETOUCH VERIO) w/Device KIT 1 Stick by Does not apply route 2 (two) times daily.  . Cholecalciferol (VITAMIN D3) 5000 UNITS CAPS Take 1 capsule by mouth daily.   . fluticasone (FLONASE) 50 MCG/ACT nasal spray Place 2 sprays into both  nostrils daily.  [name] glucose blood (ONE TOUCH ULTRA TEST) test strip Test bid, Dx E11.9  . JANUVIA 100 MG tablet TAKE 1 TABLET BY MOUTH EVERY DAY  . lisinopril (PRINIVIL,ZESTRIL) 5 MG tablet TAKE 1 TABLET (5 MG TOTAL) BY MOUTH DAILY.  . metFORMIN (GLUCOPHAGE) 500 MG tablet Take 1 tablet (500 mg total) by mouth 2 (two) times daily with a meal.  . Omega-3 Fatty Acids (FISH OIL) 1000 MG CAPS Take 2,000 mg by mouth daily.   No facility-administered encounter medications on file as of 04/27/2018.       Review of Systems  Constitutional: Negative.   HENT: Negative.   Eyes: Negative.   Respiratory: Negative.   Cardiovascular: Negative.   Gastrointestinal: Negative.   Endocrine: Negative.   Genitourinary: Negative.   Musculoskeletal: Negative.   Skin: Negative.   Allergic/Immunologic: Negative.   Neurological: Negative.   Hematological: Negative.   Psychiatric/Behavioral: Negative.        Objective:   Physical Exam  Constitutional: He is oriented to person, place, and time. He appears well-developed and well-nourished. No distress.  HENT:  Head: Normocephalic and atraumatic.  Right Ear: External ear normal.  Left Ear: External ear normal.  Nose: Nose normal.  Mouth/Throat: Oropharynx is clear and moist. No oropharyngeal exudate.  Eyes: Pupils are equal, round, and reactive to light. Conjunctivae and EOM are normal. Right eye exhibits no discharge. Left eye  exhibits no discharge. No scleral icterus.  Neck: Normal range of motion. Neck supple. No thyromegaly present.  No bruits thyromegaly or anterior cervical adenopathy  Cardiovascular: Normal rate, regular rhythm, normal heart sounds and intact distal pulses. Exam reveals no gallop.  No murmur heard. Heart is regular at 72/min without murmurs or gallops  Pulmonary/Chest: Effort normal and breath sounds normal. No respiratory distress. He exhibits no tenderness.  Clear anteriorly and posteriorly and no axillary adenopathy  Abdominal:  Soft. Bowel sounds are normal. He exhibits no mass. There is no tenderness. There is no rebound and no guarding.  The abdomen is soft without masses tenderness or organ enlargement or bruits  Musculoskeletal: Normal range of motion. He exhibits no edema or tenderness.  Lymphadenopathy:    He has no cervical adenopathy.  Neurological: He is alert and oriented to person, place, and time. He has normal reflexes. No cranial nerve deficit.  Reflexes are equal bilaterally  Skin: Skin is warm and dry. Capillary refill takes less than 2 seconds. No rash noted. No erythema. No pallor.  Psychiatric: He has a normal mood and affect. His behavior is normal. Judgment and thought content normal.  Nursing note and vitals reviewed.   BP 123/74 (BP Location: Left Arm)   Pulse 73   Temp (!) 97.4 F (36.3 C) (Oral)   Ht '5\' 10"'$  (1.778 m)   Wt 171 lb (77.6 kg)   BMI 24.54 kg/m   1. Type 2 diabetes mellitus without complication, without long-term current use of insulin (HCC) -Continue with current treatment and most importantly aggressive therapeutic lifestyle changes - CBC with Differential/Platelet - BMP8+EGFR - Bayer DCA Hb A1c Waived - Microalbumin / creatinine urine ratio  2. Vitamin D deficiency -Continue with current treatment pending results of lab work - CBC with Differential/Platelet - VITAMIN D 25 Hydroxy (Vit-D Deficiency, Fractures)  3. Pure hypercholesterolemia -Continue with aggressive therapeutic lifestyle changes and current treatment - CBC with Differential/Platelet - Lipid panel - Hepatic function panel - DG Chest 2 View; Future  4. Benign prostatic hyperplasia without lower urinary tract symptoms -No complaints today with voiding or erectile dysfunction. - CBC with Differential/Platelet      Assessment & Plan:  1. Type 2 diabetes mellitus without complication, without long-term current use of insulin (HCC) -Continue with current treatment and aggressive therapeutic  lifestyle changes - CBC with Differential/Platelet - BMP8+EGFR - Bayer DCA Hb A1c Waived - Microalbumin / creatinine urine ratio  2. Vitamin D deficiency -Continue with vitamin D replacement pending results of lab work - CBC with Differential/Platelet - VITAMIN D 25 Hydroxy (Vit-D Deficiency, Fractures)  3. Pure hypercholesterolemia -Continue with current treatment and aggressive therapeutic lifestyle changes pending results of lab work - CBC with Differential/Platelet - Lipid panel - Hepatic function panel - DG Chest 2 View; Future  4. Benign prostatic hyperplasia without lower urinary tract symptoms -No complaints today with voiding or erectile dysfunction - CBC with Differential/Platelet  Patient Instructions                       Medicare Annual Wellness Visit  [facility] and the medical providers at [facility] strive to bring you the best medical care.  In doing so we not only want to address your current medical conditions and concerns but also to detect new conditions early and prevent illness, disease and health-related problems.    Medicare offers a yearly Wellness Visit which allows our clinical staff to assess  your need for preventative services including immunizations, lifestyle education, counseling to decrease risk of preventable diseases and screening for fall risk and other medical concerns.    This visit is provided free of charge (no copay) for all Medicare recipients. The clinical pharmacists at [facility] have begun to conduct these Wellness Visits which will also include a thorough review of all your medications.    As you primary medical provider recommend that you make an appointment for your Annual Wellness Visit if you have not done so already this year.  You may set up this appointment before you leave today or you may call back ([phone]) and schedule an appointment.  Please make sure when you call that you  mention that you are scheduling your Annual Wellness Visit with the clinical pharmacist so that the appointment may be made for the proper length of time.     Continue current medications. Continue good therapeutic lifestyle changes which include good diet and exercise. Fall precautions discussed with patient. If an FOBT was given today- please return it to our front desk. If you are over 71 years old - you may need Prevnar 7 or the adult Pneumonia vaccine.  **Flu shots are available--- please call and schedule a FLU-CLINIC appointment**  After your visit with [place] today you will receive a survey in the mail or online from [employer] regarding your care with [place]. Please take a moment to fill this out. Your feedback is very important to [place] as you can help [place] better understand your patient needs as well as improve your experience and satisfaction. WE CARE ABOUT YOU!!!   Continue aggressive therapeutic lifestyle changes Continue with allergy medicines Check for ticks regularly Use deep Sherral Hammers off Get eye exams yearly We will call with lab work results as soon as these results become available  Arrie Senate MD

## 2018-04-28 LAB — BMP8+EGFR
BUN / CREAT RATIO: 16 (ref 10–24)
BUN: 14 mg/dL (ref 8–27)
CHLORIDE: 98 mmol/L (ref 96–106)
CO2: 24 mmol/L (ref 20–29)
CREATININE: 0.9 mg/dL (ref 0.76–1.27)
Calcium: 10 mg/dL (ref 8.6–10.2)
GFR calc non Af Amer: 87 mL/min/{1.73_m2} (ref >59)
GFR, EST AFRICAN AMERICAN: 101 mL/min/{1.73_m2} (ref >59)
GLUCOSE: 167 mg/dL — AB (ref 65–99)
Potassium: 4.9 mmol/L (ref 3.5–5.2)
SODIUM: 137 mmol/L (ref 134–144)

## 2018-04-28 LAB — CBC WITH DIFFERENTIAL/PLATELET
BASOS ABS: 0 10*3/uL (ref 0.0–0.2)
BASOS: 0 %
EOS (ABSOLUTE): 0.1 10*3/uL (ref 0.0–0.4)
Eos: 1 %
Hematocrit: 46.4 % (ref 37.5–51.0)
Hemoglobin: 15.4 g/dL (ref 13.0–17.7)
Immature Grans (Abs): 0 10*3/uL (ref 0.0–0.1)
Immature Granulocytes: 0 %
LYMPHS ABS: 2.2 10*3/uL (ref 0.7–3.1)
Lymphs: 26 %
MCH: 29.2 pg (ref 26.6–33.0)
MCHC: 33.2 g/dL (ref 31.5–35.7)
MCV: 88 fL (ref 79–97)
Monocytes Absolute: 0.7 10*3/uL (ref 0.1–0.9)
Monocytes: 8 %
Neutrophils Absolute: 5.3 10*3/uL (ref 1.4–7.0)
Neutrophils: 65 %
PLATELETS: 275 10*3/uL (ref 150–379)
RBC: 5.27 x10E6/uL (ref 4.14–5.80)
RDW: 13.7 % (ref 12.3–15.4)
WBC: 8.3 10*3/uL (ref 3.4–10.8)

## 2018-04-28 LAB — LIPID PANEL
Chol/HDL Ratio: 2.5 ratio (ref 0.0–5.0)
Cholesterol, Total: 102 mg/dL (ref 100–199)
HDL: 41 mg/dL (ref >39)
LDL CALC: 44 mg/dL (ref 0–99)
Triglycerides: 83 mg/dL (ref 0–149)
VLDL Cholesterol Cal: 17 mg/dL (ref 5–40)

## 2018-04-28 LAB — MICROALBUMIN / CREATININE URINE RATIO
Creatinine, Urine: 157.1 mg/dL
MICROALB/CREAT RATIO: 13.3 mg/g{creat} (ref 0.0–30.0)
Microalbumin, Urine: 20.9 ug/mL

## 2018-04-28 LAB — VITAMIN D 25 HYDROXY (VIT D DEFICIENCY, FRACTURES): VIT D 25 HYDROXY: 71.7 ng/mL (ref 30.0–100.0)

## 2018-04-28 LAB — HEPATIC FUNCTION PANEL
ALBUMIN: 4.7 g/dL (ref 3.6–4.8)
ALK PHOS: 52 IU/L (ref 39–117)
ALT: 24 IU/L (ref 0–44)
AST: 16 IU/L (ref 0–40)
Bilirubin Total: 0.9 mg/dL (ref 0.0–1.2)
Bilirubin, Direct: 0.26 mg/dL (ref 0.00–0.40)
TOTAL PROTEIN: 7.2 g/dL (ref 6.0–8.5)

## 2018-05-02 ENCOUNTER — Encounter: Payer: Self-pay | Admitting: Family Medicine

## 2018-06-06 ENCOUNTER — Other Ambulatory Visit: Payer: Self-pay | Admitting: Family Medicine

## 2018-06-09 ENCOUNTER — Other Ambulatory Visit: Payer: Self-pay | Admitting: *Deleted

## 2018-06-09 MED ORDER — LISINOPRIL 5 MG PO TABS: 5.0000 mg | ORAL_TABLET | Freq: Every day | ORAL | 1 refills | Status: DC

## 2018-06-10 ENCOUNTER — Other Ambulatory Visit: Payer: Self-pay | Admitting: Family Medicine

## 2018-07-25 ENCOUNTER — Other Ambulatory Visit: Payer: Self-pay | Admitting: Family Medicine

## 2018-09-03 ENCOUNTER — Other Ambulatory Visit: Payer: Self-pay | Admitting: Family Medicine

## 2018-09-05 NOTE — Telephone Encounter (Signed)
Last seen 04/27/18  DWM

## 2018-09-18 ENCOUNTER — Other Ambulatory Visit: Payer: Self-pay | Admitting: Family Medicine

## 2018-09-19 NOTE — Telephone Encounter (Signed)
Ov 10/05/18

## 2018-09-30 ENCOUNTER — Ambulatory Visit: Payer: PPO | Admitting: Family Medicine

## 2018-10-05 ENCOUNTER — Encounter: Payer: Self-pay | Admitting: Family Medicine

## 2018-10-05 ENCOUNTER — Ambulatory Visit (INDEPENDENT_AMBULATORY_CARE_PROVIDER_SITE_OTHER): Payer: PPO | Admitting: Family Medicine

## 2018-10-05 VITALS — BP 138/73 | HR 87 | Temp 97.9°F | Ht 70.0 in | Wt 177.0 lb

## 2018-10-05 DIAGNOSIS — E119 Type 2 diabetes mellitus without complications: Secondary | ICD-10-CM | POA: Diagnosis not present

## 2018-10-05 DIAGNOSIS — J301 Allergic rhinitis due to pollen: Secondary | ICD-10-CM

## 2018-10-05 DIAGNOSIS — E7800 Pure hypercholesterolemia, unspecified: Secondary | ICD-10-CM

## 2018-10-05 DIAGNOSIS — N5201 Erectile dysfunction due to arterial insufficiency: Secondary | ICD-10-CM

## 2018-10-05 DIAGNOSIS — E559 Vitamin D deficiency, unspecified: Secondary | ICD-10-CM | POA: Diagnosis not present

## 2018-10-05 LAB — BAYER DCA HB A1C WAIVED: HB A1C (BAYER DCA - WAIVED): 6.7 % (ref <7.0)

## 2018-10-05 MED ORDER — AZELASTINE HCL 0.1 % NA SOLN: NASAL | 3 refills | Status: DC

## 2018-10-05 MED ORDER — SILDENAFIL CITRATE 20 MG PO TABS: ORAL_TABLET | ORAL | 3 refills | Status: DC

## 2018-10-05 NOTE — Progress Notes (Signed)
Subjective:    Patient ID: Peter Mathis, male    DOB: 1951/06/24, 67 y.o.   MRN: [id number]  HPI Pt here for follow up and management of chronic medical problems which includes hyperlipidemia and diabetes. He is taking medication regularly.  The patient is doing well today with no specific complaints.  He has diabetes, hypertension, and hyperlipidemia.  He also has allergic rhinitis and vitamin D deficiency.  We will check with his insurance regarding the Vascepa to see if his insurance will cover this.  The patient is pleasant young and in good spirits.  He looks much younger than his stated age.  He denies any chest pain pressure tightness shortness of breath GI issues including swallowing heartburn indigestion nausea vomiting change in bowel habits or blood in the stool.  He is passing his water well.  He says he has episodes of having problems with erectile dysfunction.  He would like something for this.  He says that his blood sugars at home typically run around 120 in the morning but may go as high as 160 and average about 140.  This morning he is already run a mile and walked a mile.  He stays active physically with no discomforts with doing this.  He is up-to-date on his health maintenance issues.    Patient Active Problem List   Diagnosis Date Noted  . Erectile dysfunction due to arterial insufficiency 01/23/2016  . Right bundle branch block, history of 01/17/2014  . Hyperlipemia 07/12/2013  . Type II diabetes mellitus ([place]) 07/12/2013  . Benign prostatic hyperplasia without lower urinary tract symptoms 07/12/2013   Outpatient Encounter Medications as of 10/05/2018  Medication Sig  . aspirin (ASPIRIN LOW DOSE) 81 MG EC tablet Take 81 mg by mouth daily.    [name] atorvastatin (LIPITOR) 80 MG tablet TAKE 1/2 TABLET BY MOUTH AT BEDTIME  . azelastine (ASTELIN) 0.1 % nasal spray 2 sprays each nostril at bedtime  . Blood Glucose Monitoring Suppl (ONETOUCH VERIO) w/Device KIT 1 Stick by Does  not apply route 2 (two) times daily.  . Cholecalciferol (VITAMIN D3) 5000 UNITS CAPS Take 1 capsule by mouth daily.   . fluticasone (FLONASE) 50 MCG/ACT nasal spray Place 2 sprays into both nostrils daily.  [name] glucose blood (ONE TOUCH ULTRA TEST) test strip Test bid, Dx E11.9  . JANUVIA 100 MG tablet TAKE 1 TABLET BY MOUTH EVERY DAY  . lisinopril (PRINIVIL,ZESTRIL) 5 MG tablet Take 1 tablet (5 mg total) by mouth daily.  . metFORMIN (GLUCOPHAGE) 500 MG tablet TAKE 1 TABLET (500 MG TOTAL) BY MOUTH 2 (TWO) TIMES DAILY WITH A MEAL.  [name] Omega-3 Fatty Acids (FISH OIL) 1000 MG CAPS Take 2,000 mg by mouth daily.   No facility-administered encounter medications on file as of 10/05/2018.      Review of Systems  Constitutional: Negative.   HENT: Negative.   Eyes: Negative.   Respiratory: Negative.   Cardiovascular: Negative.   Gastrointestinal: Negative.   Endocrine: Negative.   Genitourinary: Negative.   Musculoskeletal: Negative.   Skin: Negative.   Allergic/Immunologic: Negative.   Neurological: Negative.   Hematological: Negative.   Psychiatric/Behavioral: Negative.        Objective:   Physical Exam  Constitutional: He is oriented to person, place, and time. He appears well-developed and well-nourished. No distress.  The patient is pleasant and doing well and looks much younger than his stated age  HENT:  Head: Normocephalic and atraumatic.  Right Ear: External ear normal.  Left Ear: External ear normal.  Mouth/Throat: Oropharynx is clear and moist. No oropharyngeal exudate.  Slight nasal turbinate congestion bilaterally  Eyes: Pupils are equal, round, and reactive to light. Conjunctivae and EOM are normal. Right eye exhibits no discharge. Left eye exhibits no discharge. No scleral icterus.  Gets eye exams every November  Neck: Normal range of motion. Neck supple. No tracheal deviation present. No thyromegaly present.  No bruits thyromegaly or anterior cervical adenopathy    Cardiovascular: Normal rate, regular rhythm, normal heart sounds and intact distal pulses.  No murmur heard. Heart is regular at 72/min with good pedal pulses and no edema and no murmur  Pulmonary/Chest: Effort normal and breath sounds normal. He has no wheezes. He has no rales. He exhibits no tenderness.  Clear anteriorly and posteriorly no axillary adenopathy  Abdominal: Soft. Bowel sounds are normal. He exhibits no mass. There is no tenderness.  No liver or spleen enlargement epigastric or suprapubic tenderness inguinal adenopathy with good inguinal pulses no masses and no bruits  Musculoskeletal: Normal range of motion. He exhibits no edema or tenderness.  Lymphadenopathy:    He has no cervical adenopathy.  Neurological: He is alert and oriented to person, place, and time. He has normal reflexes. No cranial nerve deficit.  Reflexes in the lower extremity are diminished bilaterally but equal.  Skin: Skin is warm and dry. No rash noted.  Psychiatric: He has a normal mood and affect. His behavior is normal. Judgment and thought content normal.  The patient's mood affect and behavior are all normal.  Nursing note and vitals reviewed.  BP 138/73 (BP Location: Left Arm)   Pulse 87   Temp 97.9 F (36.6 C) (Oral)   Ht '5\' 10"'  (1.778 m)   Wt 177 lb (80.3 kg)   BMI 25.40 kg/m         Assessment & Plan:  1. Vitamin D deficiency -Continue current treatment pending results of lab work - CBC with Differential/Platelet - VITAMIN D 25 Hydroxy (Vit-D Deficiency, Fractures)  2. Type 2 diabetes mellitus without complication, without long-term current use of insulin ([place]) -Continue current treatment pending results of lab work and continue with aggressive therapeutic lifestyle changes and healthy eating habits - BMP8+EGFR - CBC with Differential/Platelet - Bayer DCA Hb A1c Waived  3. Pure hypercholesterolemia -Continue current treatment pending results of lab work - CBC with  Differential/Platelet - Lipid panel - Hepatic function panel  4. Erectile dysfunction due to arterial insufficiency -Generic Viagra 20 mg and take up to 100 mg as needed for response  5. Seasonal allergic rhinitis due to pollen -Continue with Astelin and Flonase and allergen avoidance as much as possible  No orders of the defined types were placed in this encounter.  Patient Instructions                       Medicare Annual Wellness Visit  [facility] and the medical providers at [facility] strive to bring you the best medical care.  In doing so we not only want to address your current medical conditions and concerns but also to detect new conditions early and prevent illness, disease and health-related problems.    Medicare offers a yearly Wellness Visit which allows our clinical staff to assess your need for preventative services including immunizations, lifestyle education, counseling to decrease risk of preventable diseases and screening for fall risk and other medical concerns.    This visit is provided free of  charge (no copay) for all Medicare recipients. The clinical pharmacists at [facility] have begun to conduct these Wellness Visits which will also include a thorough review of all your medications.    As you primary medical provider recommend that you make an appointment for your Annual Wellness Visit if you have not done so already this year.  You may set up this appointment before you leave today or you may call back ([phone]) and schedule an appointment.  Please make sure when you call that you mention that you are scheduling your Annual Wellness Visit with the clinical pharmacist so that the appointment may be made for the proper length of time.     Continue current medications. Continue good therapeutic lifestyle changes which include good diet and exercise. Fall precautions discussed with patient. If an FOBT was given  today- please return it to our front desk. If you are over 37 years old - you may need Prevnar 54 or the adult Pneumonia vaccine.  **Flu shots are available--- please call and schedule a FLU-CLINIC appointment**  After your visit with [place] today you will receive a survey in the mail or online from [employer] regarding your care with [place]. Please take a moment to fill this out. Your feedback is very important to [place] as you can help [place] better understand your patient needs as well as improve your experience and satisfaction. WE CARE ABOUT YOU!!!   Continue to stay active physically Follow-up with cardiology every 3 to 4 years because of increased risk factors Check blood sugars regularly Check feet regularly Keep weight down Get colonoscopy every 10 years and get yearly FOBT's See eye doctor yearly and make sure that we get a copy of that report We will call in a prescription for generic Viagra and you can start with the 20 mg and increase this as necessary up to 100 mg and we will put refills on this.  Arrie Senate MD

## 2018-10-05 NOTE — Addendum Note (Signed)
Addended by: Zannie Cove on: 10/05/2018 08:47 AM   Modules accepted: Orders

## 2018-10-05 NOTE — Patient Instructions (Addendum)
Medicare Annual Wellness Visit  [facility] and the medical providers at [facility] strive to bring you the best medical care.  In doing so we not only want to address your current medical conditions and concerns but also to detect new conditions early and prevent illness, disease and health-related problems.    Medicare offers a yearly Wellness Visit which allows our clinical staff to assess your need for preventative services including immunizations, lifestyle education, counseling to decrease risk of preventable diseases and screening for fall risk and other medical concerns.    This visit is provided free of charge (no copay) for all Medicare recipients. The clinical pharmacists at [facility] have begun to conduct these Wellness Visits which will also include a thorough review of all your medications.    As you primary medical provider recommend that you make an appointment for your Annual Wellness Visit if you have not done so already this year.  You may set up this appointment before you leave today or you may call back ([phone]) and schedule an appointment.  Please make sure when you call that you mention that you are scheduling your Annual Wellness Visit with the clinical pharmacist so that the appointment may be made for the proper length of time.     Continue current medications. Continue good therapeutic lifestyle changes which include good diet and exercise. Fall precautions discussed with patient. If an FOBT was given today- please return it to our front desk. If you are over 45 years old - you may need Prevnar 45 or the adult Pneumonia vaccine.  **Flu shots are available--- please call and schedule a FLU-CLINIC appointment**  After your visit with [place] today you will receive a survey in the mail or online from [employer] regarding your care with [place]. Please take a moment to fill this out. Your feedback is very  important to [place] as you can help [place] better understand your patient needs as well as improve your experience and satisfaction. WE CARE ABOUT YOU!!!   Continue to stay active physically Follow-up with cardiology every 3 to 4 years because of increased risk factors Check blood sugars regularly Check feet regularly Keep weight down Get colonoscopy every 10 years and get yearly FOBT's See eye doctor yearly and make sure that we get a copy of that report We will call in a prescription for generic Viagra and you can start with the 20 mg and increase this as necessary up to 100 mg and we will put refills on this.

## 2018-10-06 LAB — CBC WITH DIFFERENTIAL/PLATELET
Basophils Absolute: 0 10*3/uL (ref 0.0–0.2)
Basos: 1 %
EOS (ABSOLUTE): 0.1 10*3/uL (ref 0.0–0.4)
Eos: 1 %
Hematocrit: 45.1 % (ref 37.5–51.0)
Hemoglobin: 14.8 g/dL (ref 13.0–17.7)
IMMATURE GRANULOCYTES: 0 %
Immature Grans (Abs): 0 10*3/uL (ref 0.0–0.1)
Lymphocytes Absolute: 1.9 10*3/uL (ref 0.7–3.1)
Lymphs: 23 %
MCH: 28.8 pg (ref 26.6–33.0)
MCHC: 32.8 g/dL (ref 31.5–35.7)
MCV: 88 fL (ref 79–97)
MONOS ABS: 0.6 10*3/uL (ref 0.1–0.9)
Monocytes: 7 %
NEUTROS PCT: 68 %
Neutrophils Absolute: 5.7 10*3/uL (ref 1.4–7.0)
PLATELETS: 294 10*3/uL (ref 150–450)
RBC: 5.13 x10E6/uL (ref 4.14–5.80)
RDW: 12.6 % (ref 12.3–15.4)
WBC: 8.4 10*3/uL (ref 3.4–10.8)

## 2018-10-06 LAB — BMP8+EGFR
BUN/Creatinine Ratio: 16 (ref 10–24)
BUN: 14 mg/dL (ref 8–27)
CALCIUM: 9.9 mg/dL (ref 8.6–10.2)
CO2: 23 mmol/L (ref 20–29)
CREATININE: 0.9 mg/dL (ref 0.76–1.27)
Chloride: 99 mmol/L (ref 96–106)
GFR calc Af Amer: 101 mL/min/{1.73_m2} (ref >59)
GFR calc non Af Amer: 87 mL/min/{1.73_m2} (ref >59)
Glucose: 146 mg/dL — ABNORMAL HIGH (ref 65–99)
Potassium: 4.7 mmol/L (ref 3.5–5.2)
SODIUM: 139 mmol/L (ref 134–144)

## 2018-10-06 LAB — HEPATIC FUNCTION PANEL
ALT: 24 IU/L (ref 0–44)
AST: 21 IU/L (ref 0–40)
Albumin: 4.5 g/dL (ref 3.6–4.8)
Alkaline Phosphatase: 40 IU/L (ref 39–117)
BILIRUBIN TOTAL: 0.6 mg/dL (ref 0.0–1.2)
BILIRUBIN, DIRECT: 0.22 mg/dL (ref 0.00–0.40)
Total Protein: 6.9 g/dL (ref 6.0–8.5)

## 2018-10-06 LAB — VITAMIN D 25 HYDROXY (VIT D DEFICIENCY, FRACTURES): VIT D 25 HYDROXY: 67.2 ng/mL (ref 30.0–100.0)

## 2018-10-06 LAB — LIPID PANEL
Chol/HDL Ratio: 2.5 ratio (ref 0.0–5.0)
Cholesterol, Total: 97 mg/dL — ABNORMAL LOW (ref 100–199)
HDL: 39 mg/dL — AB (ref >39)
LDL Calculated: 38 mg/dL (ref 0–99)
Triglycerides: 99 mg/dL (ref 0–149)
VLDL Cholesterol Cal: 20 mg/dL (ref 5–40)

## 2018-10-13 ENCOUNTER — Other Ambulatory Visit: Payer: Self-pay

## 2018-10-13 MED ORDER — ICOSAPENT ETHYL 1 G PO CAPS: 2.0000 | ORAL_CAPSULE | Freq: Two times a day (BID) | ORAL | 1 refills | Status: DC

## 2018-10-17 ENCOUNTER — Other Ambulatory Visit: Payer: PPO

## 2018-10-17 DIAGNOSIS — Z1211 Encounter for screening for malignant neoplasm of colon: Secondary | ICD-10-CM | POA: Diagnosis not present

## 2018-10-19 LAB — FECAL OCCULT BLOOD, IMMUNOCHEMICAL: Fecal Occult Bld: NEGATIVE

## 2018-11-28 ENCOUNTER — Other Ambulatory Visit: Payer: Self-pay | Admitting: Family Medicine

## 2018-11-29 ENCOUNTER — Other Ambulatory Visit: Payer: Self-pay | Admitting: Family Medicine

## 2018-12-06 ENCOUNTER — Ambulatory Visit (INDEPENDENT_AMBULATORY_CARE_PROVIDER_SITE_OTHER): Payer: PPO | Admitting: Family Medicine

## 2018-12-06 ENCOUNTER — Encounter: Payer: Self-pay | Admitting: Family Medicine

## 2018-12-06 VITALS — BP 148/75 | HR 72 | Temp 97.7°F | Ht 70.0 in | Wt 171.0 lb

## 2018-12-06 DIAGNOSIS — J0100 Acute maxillary sinusitis, unspecified: Secondary | ICD-10-CM | POA: Diagnosis not present

## 2018-12-06 MED ORDER — DOXYCYCLINE HYCLATE 100 MG PO TABS: 100.0000 mg | ORAL_TABLET | Freq: Two times a day (BID) | ORAL | 0 refills | Status: AC

## 2018-12-06 MED ORDER — FLUTICASONE PROPIONATE 50 MCG/ACT NA SUSP: 2.0000 | Freq: Every day | NASAL | 3 refills | Status: DC

## 2018-12-06 NOTE — Progress Notes (Signed)
Subjective:    Patient ID: Peter Mathis, male    DOB: 11/28/1951, 67 y.o.   MRN: [id number]  Chief Complaint:  URI (sinus drainage and congestion x 1 week, eyes red and draining x 2 days)   HPI: Peter Mathis is a 67 y.o. male presenting on 12/06/2018 for URI (sinus drainage and congestion x 1 week, eyes red and draining x 2 days)  Pt presents today with 1 week of cough, congestion, sinus pressure and drainage, watery eyes with redness, headache, sore throat, and teeth pain. Pt states he has been using over the counter Claritin and Robitussin DM without relief of the symptoms. He states he has had intermittent chills. Has not measured his temperature at home.   Relevant past medical, surgical, family, and social history reviewed and updated as indicated.  Allergies and medications reviewed and updated.   Past Medical History:  Diagnosis Date  . Chest discomfort 1989  . Diverticulosis   . DM type 2 (diabetes mellitus, type 2) ([place])   . Heart murmur   . Hyperlipidemia     Past Surgical History:  Procedure Laterality Date  . TONSILLECTOMY AND ADENOIDECTOMY  01/1955    Social History   Socioeconomic History  . Marital status: Married    Spouse name: Not on file  . Number of children: Not on file  . Years of education: Not on file  . Highest education level: Not on file  Occupational History  . Not on file  Social Needs  . Financial resource strain: Not on file  . Food insecurity:    Worry: Not on file    Inability: Not on file  . Transportation needs:    Medical: Not on file    Non-medical: Not on file  Tobacco Use  . Smoking status: Never Smoker  . Smokeless tobacco: Never Used  Substance and Sexual Activity  . Alcohol use: No  . Drug use: No  . Sexual activity: Not on file  Lifestyle  . Physical activity:    Days per week: Not on file    Minutes per session: Not on file  . Stress: Not at all  Relationships  . Social connections:    Talks on  phone: More than three times a week    Gets together: More than three times a week    Attends religious service: More than 4 times per year    Active member of club or organization: Yes    Attends meetings of clubs or organizations: More than 4 times per year    Relationship status: Married  . Intimate partner violence:    Fear of current or ex partner: Not on file    Emotionally abused: Not on file    Physically abused: Not on file    Forced sexual activity: Not on file  Other Topics Concern  . Not on file  Social History Narrative  . Not on file    Outpatient Encounter Medications as of 12/06/2018  Medication Sig  . aspirin (ASPIRIN LOW DOSE) 81 MG EC tablet Take 81 mg by mouth daily.    [name] atorvastatin (LIPITOR) 80 MG tablet TAKE 1/2 TABLET BY MOUTH AT BEDTIME  . azelastine (ASTELIN) 0.1 % nasal spray 2 sprays each nostril at bedtime  . Blood Glucose Monitoring Suppl (ONETOUCH VERIO) w/Device KIT 1 Stick by Does not apply route 2 (two) times daily.  . Cholecalciferol (VITAMIN D3) 5000 UNITS CAPS Take 1 capsule by mouth daily.   [name]  fluticasone (FLONASE) 50 MCG/ACT nasal spray Place 2 sprays into both nostrils daily.  [name] glucose blood (ONE TOUCH ULTRA TEST) test strip Test bid, Dx E11.9  . Icosapent Ethyl (VASCEPA) 1 g CAPS Take 2 capsules (2 g total) by mouth 2 (two) times daily.  [name] JANUVIA 100 MG tablet TAKE 1 TABLET BY MOUTH EVERY DAY  . lisinopril (PRINIVIL,ZESTRIL) 5 MG tablet TAKE 1 TABLET BY MOUTH EVERY DAY  . metFORMIN (GLUCOPHAGE) 500 MG tablet TAKE 1 TABLET (500 MG TOTAL) BY MOUTH 2 (TWO) TIMES DAILY WITH A MEAL.  [name] Omega-3 Fatty Acids (FISH OIL) 1000 MG CAPS Take 2,000 mg by mouth daily.  . sildenafil (REVATIO) 20 MG tablet Take 2-5 tabs po PRN prior to sexual activity.  . [DISCONTINUED] fluticasone (FLONASE) 50 MCG/ACT nasal spray Place 2 sprays into both nostrils daily.  [name] doxycycline (VIBRA-TABS) 100 MG tablet Take 1 tablet (100 mg total) by mouth 2 (two) times daily for  7 days. 1 po bid   No facility-administered encounter medications on file as of 12/06/2018.     Allergies  Allergen Reactions  . Penicillins Rash    Review of Systems  Constitutional: Positive for chills and fatigue. Negative for fever.  HENT: Positive for congestion, postnasal drip, rhinorrhea, sinus pressure and sore throat.   Eyes: Positive for discharge (clear), redness and itching.  Respiratory: Positive for cough. Negative for shortness of breath and wheezing.   Cardiovascular: Negative for chest pain and palpitations.  Gastrointestinal: Negative for abdominal pain, constipation, diarrhea, nausea and vomiting.  Musculoskeletal: Positive for myalgias.  Neurological: Positive for headaches. Negative for dizziness, weakness and light-headedness.  Psychiatric/Behavioral: Negative for confusion.  All other systems reviewed and are negative.       Objective:    BP (!) 148/75   Pulse 72   Temp 97.7 F (36.5 C) (Oral)   Ht '5\' 10"'  (1.778 m)   Wt 171 lb (77.6 kg)   BMI 24.54 kg/m    Wt Readings from Last 3 Encounters:  12/06/18 171 lb (77.6 kg)  10/05/18 177 lb (80.3 kg)  04/27/18 171 lb (77.6 kg)    Physical Exam  Constitutional: He is oriented to person, place, and time. He appears well-developed and well-nourished. He is cooperative. No distress.  HENT:  Head: Normocephalic and atraumatic.  Right Ear: Hearing, external ear and ear canal normal. Tympanic membrane is not perforated and not erythematous. A middle ear effusion is present.  Left Ear: Hearing, external ear and ear canal normal. Tympanic membrane is not perforated and not erythematous. A middle ear effusion is present.  Nose: Mucosal edema and rhinorrhea present. Right sinus exhibits maxillary sinus tenderness and frontal sinus tenderness. Left sinus exhibits maxillary sinus tenderness and frontal sinus tenderness.  Mouth/Throat: Uvula is midline and mucous membranes are normal. Posterior oropharyngeal  erythema present. No oropharyngeal exudate, posterior oropharyngeal edema or tonsillar abscesses. No tonsillar exudate.  Postnasal drainage   Eyes: Pupils are equal, round, and reactive to light. Lids are normal. Right eye exhibits discharge (clear). Left eye exhibits discharge (clear). Right conjunctiva is injected. Left conjunctiva is injected.  Neck: Trachea normal and phonation normal. No thyroid mass and no thyromegaly present.  Cardiovascular: Normal rate, regular rhythm and normal heart sounds. Exam reveals no gallop and no friction rub.  No murmur heard. Pulmonary/Chest: Effort normal and breath sounds normal. No respiratory distress.  Lymphadenopathy:       Head (left side): Submandibular adenopathy present.  Neurological: He is alert and oriented  to person, place, and time.  Skin: Skin is warm and dry. Capillary refill takes less than 2 seconds.  Psychiatric: He has a normal mood and affect. His speech is normal and behavior is normal. Judgment and thought content normal. Cognition and memory are normal.  Nursing note and vitals reviewed.   Results for orders placed or performed in visit on 10/17/18  Fecal occult blood, imunochemical  Result Value Ref Range   Fecal Occult Bld Negative Negative       Pertinent labs & imaging results that were available during my care of the patient were reviewed by me and considered in my medical decision making.  Assessment & Plan:  K[place] was seen today for URI symptoms.  Diagnoses and all orders for this visit:  Acute non-recurrent maxillary sinusitis  Increase fluids. Avoid cigarette smoke. Tylenol as needed for fever and pain control. Due to PCN allergy, will treat with doxycycline.  -     doxycycline (VIBRA-TABS) 100 MG tablet; Take 1 tablet (100 mg total) by mouth 2 (two) times daily for 7 days. 1 po bid -     fluticasone (FLONASE) 50 MCG/ACT nasal spray; Place 2 sprays into both nostrils daily.      Continue all other  maintenance medications.  Follow up plan: Return if symptoms worsen or fail to improve.  Educational handout given for sinusitis   The above assessment and management plan was discussed with the patient. The patient verbalized understanding of and has agreed to the management plan. Patient is aware to call the clinic if symptoms persist or worsen. Patient is aware when to return to the clinic for a follow-up visit. Patient educated on when it is appropriate to go to the emergency department.   Monia Pouch, FNP-C [facility] [phone]

## 2018-12-06 NOTE — Patient Instructions (Signed)

## 2018-12-09 ENCOUNTER — Other Ambulatory Visit: Payer: Self-pay | Admitting: Family Medicine

## 2018-12-28 ENCOUNTER — Other Ambulatory Visit: Payer: Self-pay | Admitting: Family Medicine

## 2019-02-02 ENCOUNTER — Other Ambulatory Visit: Payer: Self-pay | Admitting: Family Medicine

## 2019-02-08 ENCOUNTER — Encounter: Payer: Self-pay | Admitting: Family Medicine

## 2019-02-08 ENCOUNTER — Ambulatory Visit (INDEPENDENT_AMBULATORY_CARE_PROVIDER_SITE_OTHER): Payer: PPO | Admitting: Family Medicine

## 2019-02-08 VITALS — BP 136/77 | HR 67 | Temp 96.8°F | Ht 70.0 in | Wt 182.0 lb

## 2019-02-08 DIAGNOSIS — E559 Vitamin D deficiency, unspecified: Secondary | ICD-10-CM

## 2019-02-08 DIAGNOSIS — Z0001 Encounter for general adult medical examination with abnormal findings: Secondary | ICD-10-CM

## 2019-02-08 DIAGNOSIS — Z0000 Encounter for general adult medical examination without abnormal findings: Secondary | ICD-10-CM

## 2019-02-08 DIAGNOSIS — N400 Enlarged prostate without lower urinary tract symptoms: Secondary | ICD-10-CM

## 2019-02-08 DIAGNOSIS — E7800 Pure hypercholesterolemia, unspecified: Secondary | ICD-10-CM

## 2019-02-08 DIAGNOSIS — E119 Type 2 diabetes mellitus without complications: Secondary | ICD-10-CM

## 2019-02-08 LAB — URINALYSIS, COMPLETE
Bilirubin, UA: NEGATIVE
Glucose, UA: NEGATIVE
Ketones, UA: NEGATIVE
Leukocytes, UA: NEGATIVE
Nitrite, UA: NEGATIVE
Protein, UA: NEGATIVE
RBC, UA: NEGATIVE
Specific Gravity, UA: <1.005 — ABNORMAL LOW (ref 1.005–1.030)
Urobilinogen, Ur: 0.2 mg/dL (ref 0.2–1.0)
pH, UA: 6.5 (ref 5.0–7.5)

## 2019-02-08 LAB — MICROSCOPIC EXAMINATION
Bacteria, UA: NONE SEEN
EPITHELIAL CELLS (NON RENAL): NONE SEEN /HPF (ref 0–10)
RBC, UA: NONE SEEN /hpf (ref 0–2)
RENAL EPITHEL UA: NONE SEEN /HPF
WBC, UA: NONE SEEN /hpf (ref 0–5)

## 2019-02-08 MED ORDER — METFORMIN HCL 500 MG PO TABS: ORAL_TABLET | ORAL | 3 refills | Status: DC

## 2019-02-08 NOTE — Patient Instructions (Signed)
Medicare Annual Wellness Visit  [facility] and the medical providers at [facility] strive to bring you the best medical care.  In doing so we not only want to address your current medical conditions and concerns but also to detect new conditions early and prevent illness, disease and health-related problems.    Medicare offers a yearly Wellness Visit which allows our clinical staff to assess your need for preventative services including immunizations, lifestyle education, counseling to decrease risk of preventable diseases and screening for fall risk and other medical concerns.    This visit is provided free of charge (no copay) for all Medicare recipients. The clinical pharmacists at [facility] have begun to conduct these Wellness Visits which will also include a thorough review of all your medications.    As you primary medical provider recommend that you make an appointment for your Annual Wellness Visit if you have not done so already this year.  You may set up this appointment before you leave today or you may call back ([phone]) and schedule an appointment.  Please make sure when you call that you mention that you are scheduling your Annual Wellness Visit with the clinical pharmacist so that the appointment may be made for the proper length of time.     Continue current medications. Continue good therapeutic lifestyle changes which include good diet and exercise. Fall precautions discussed with patient. If an FOBT was given today- please return it to our front desk. If you are over 50 years old - you may need Prevnar 13 or the adult Pneumonia vaccine.  **Flu shots are available--- please call and schedule a FLU-CLINIC appointment**  After your visit with us today you will receive a survey in the mail or online from [employer] regarding your care with us. Please take a moment to fill this out. Your feedback is very  important to us as you can help us better understand your patient needs as well as improve your experience and satisfaction. WE CARE ABOUT YOU!!!    

## 2019-02-08 NOTE — Progress Notes (Signed)
Subjective:    Patient ID: Peter Mathis, male    DOB: Aug 02, 1951, 68 y.o.   MRN: [id number]  HPI  Patient is here today for annual wellness exam and follow up of chronic medical problems which includes diabetes and hyperlipidemia. He is taking medication regularly.  The patient is doing well he comes in today with no complaints.  He has hyperlipidemia type 2 diabetes and BPH.  He has a history of a right bundle branch block.  He is currently taking metformin 500 mg 3 daily.  Both of his parents are still living.  His mother has diabetes and father has had heart disease.  The patient is taking atorvastatin and is now on Vascepa.  He takes vitamin D 3 uses Flonase for allergic rhinitis and is on Januvia and metformin.  Patient is doing well and continues to exercise regularly.  He denies any chest pain pressure tightness or shortness of breath.  He denies any trouble with his stomach including swallowing heartburn indigestion nausea vomiting diarrhea blood in the stool black tarry bowel movements or change in bowel habits.  He has last colonoscopy in the summer 2017.  He is passing his water well and rarely has nocturia.  He is taking Viagra for his erectile dysfunction and takes anywhere from 80 to 100 mg for this and this works.  He is due to get his eye exam soon and will make sure that we get a copy of that.  He has had some issues with running out of Januvia and running out of metformin over the past couple months which may affect the upcoming hemoglobin A1c that we will do today.  I apologize for any inconvenience from our side about refilling this.  Is taking care of now and he will try to accumulate a few samples of Januvia before the end of the year so that he does not run out of this and have to pay out-of-pocket which she says was quite high.  His fasting blood sugars have been running around 140.  He was out of the [place] for about 2 weeks.    Patient Active Problem List   Diagnosis Date  Noted  . Erectile dysfunction due to arterial insufficiency 01/23/2016  . Right bundle branch block, history of 01/17/2014  . Hyperlipemia 07/12/2013  . Type II diabetes mellitus ([place]) 07/12/2013  . Benign prostatic hyperplasia without lower urinary tract symptoms 07/12/2013   Outpatient Encounter Medications as of 02/08/2019  Medication Sig  . aspirin (ASPIRIN LOW DOSE) 81 MG EC tablet Take 81 mg by mouth daily.    [name] atorvastatin (LIPITOR) 80 MG tablet TAKE 1/2 TABLET BY MOUTH AT BEDTIME  . azelastine (ASTELIN) 0.1 % nasal spray 2 sprays each nostril at bedtime  . Blood Glucose Monitoring Suppl (ONETOUCH VERIO) w/Device KIT 1 Stick by Does not apply route 2 (two) times daily.  . Cholecalciferol (VITAMIN D3) 5000 UNITS CAPS Take 1 capsule by mouth daily.   . fluticasone (FLONASE) 50 MCG/ACT nasal spray Place 2 sprays into both nostrils daily.  [name] glucose blood (ONE TOUCH ULTRA TEST) test strip Test bid, Dx E11.9  . Icosapent Ethyl (VASCEPA) 1 g CAPS Take 2 capsules (2 g total) by mouth 2 (two) times daily.  [name] JANUVIA 100 MG tablet TAKE 1 TABLET BY MOUTH EVERY DAY  . lisinopril (PRINIVIL,ZESTRIL) 5 MG tablet TAKE 1 TABLET BY MOUTH EVERY DAY  . Omega-3 Fatty Acids (FISH OIL) 1000 MG CAPS Take 2,000 mg by mouth  daily.  . sildenafil (REVATIO) 20 MG tablet Take 2-5 tabs po PRN prior to sexual activity.  . [DISCONTINUED] metFORMIN (GLUCOPHAGE) 500 MG tablet TAKE 1 TABLET (500 MG TOTAL) BY MOUTH 2 (TWO) TIMES DAILY WITH A MEAL.   No facility-administered encounter medications on file as of 02/08/2019.      Review of Systems  Constitutional: Negative.   HENT: Negative.   Eyes: Negative.   Respiratory: Negative.   Cardiovascular: Negative.   Gastrointestinal: Negative.   Endocrine: Negative.   Genitourinary: Negative.   Musculoskeletal: Negative.   Skin: Negative.   Allergic/Immunologic: Negative.   Neurological: Negative.   Hematological: Negative.   Psychiatric/Behavioral: Negative.         Objective:   Physical Exam Vitals signs and nursing note reviewed.  Constitutional:      General: He is not in acute distress.    Appearance: Normal appearance. He is well-developed and normal weight. He is not ill-appearing.     Comments: Patient is pleasant and alert and in good spirits.  HENT:     Head: Normocephalic and atraumatic.     Right Ear: Tympanic membrane, ear canal and external ear normal. There is no impacted cerumen.     Left Ear: Tympanic membrane, ear canal and external ear normal. There is no impacted cerumen.     Nose: Nose normal. No congestion.     Comments: Slight nasal congestion bilaterally    Mouth/Throat:     Mouth: Mucous membranes are moist.     Pharynx: Oropharynx is clear. No oropharyngeal exudate.  Eyes:     General: No scleral icterus.       Right eye: No discharge.        Left eye: No discharge.     Extraocular Movements: Extraocular movements intact.     Conjunctiva/sclera: Conjunctivae normal.     Pupils: Pupils are equal, round, and reactive to light.     Comments: Patient has eye exam scheduled soon with my eye doctor.  Neck:     Musculoskeletal: Normal range of motion and neck supple.     Thyroid: No thyromegaly.     Vascular: No carotid bruit.     Trachea: No tracheal deviation.     Comments: No bruits thyromegaly or anterior cervical adenopathy Cardiovascular:     Rate and Rhythm: Normal rate and regular rhythm.     Pulses: Normal pulses.     Heart sounds: Normal heart sounds. No murmur.     Comments: Heart is regular at 72/min with good pedal pulses and no edema Pulmonary:     Effort: Pulmonary effort is normal. No respiratory distress.     Breath sounds: Normal breath sounds. No wheezing or rales.     Comments: Clear anteriorly and posteriorly with no axillary adenopathy chest wall masses or tenderness Chest:     Chest wall: No tenderness.  Abdominal:     General: Abdomen is flat. Bowel sounds are normal.     Palpations:  Abdomen is soft. There is no mass.     Tenderness: There is no abdominal tenderness. There is no guarding.     Comments: No liver or spleen enlargement.  No epigastric tenderness.  No masses no bruits and no inguinal adenopathy with good inguinal pulses.  Genitourinary:    Penis: Normal.      Scrotum/Testes: Normal.     Rectum: Normal.     Comments: The prostate was slightly enlarged with the left being slightly larger than the right with  no lumps or masses.  The rectal exam was negative.  The external genitalia were within normal limits.  There may be a very small left inguinal hernia present and the patient said I had told him about this in the past.  He is having no problems with this. Musculoskeletal: Normal range of motion.        General: No tenderness.     Right lower leg: No edema.     Left lower leg: No edema.  Lymphadenopathy:     Cervical: No cervical adenopathy.  Skin:    General: Skin is warm and dry.     Findings: No rash.  Neurological:     General: No focal deficit present.     Mental Status: He is alert and oriented to person, place, and time. Mental status is at baseline.     Cranial Nerves: No cranial nerve deficit.     Sensory: No sensory deficit.     Gait: Gait normal.     Deep Tendon Reflexes: Reflexes are normal and symmetric. Reflexes normal.  Psychiatric:        Mood and Affect: Mood normal.        Behavior: Behavior normal.        Thought Content: Thought content normal.        Judgment: Judgment normal.     Comments: Mood affect and behavior for this patient are normal.    BP 136/77 (BP Location: Left Arm)   Pulse 67   Temp (!) 96.8 F (36 C) (Oral)   Ht '5\' 10"'  (1.778 m)   Wt 182 lb (82.6 kg)   BMI 26.11 kg/m    The EKG results are still pending==     Assessment & Plan:  1. Type 2 diabetes mellitus without complication, without long-term current use of insulin ([place]) -Continue with current treatment pending results of lab work.  In the future  if there are issues with running out of medicine or not getting enough medicine, please get in touch with [place] instead of going without the medicine. - BMP8+EGFR - CBC with Differential/Platelet - EKG 12-Lead  2. Pure hypercholesterolemia -Continue current treatment plus Vascepa pending results of lab work - CBC with Differential/Platelet - Lipid panel - EKG 12-Lead  3. Vitamin D deficiency -Continue with vitamin D replacement pending results of lab work - CBC with Differential/Platelet  4. Benign prostatic hyperplasia without lower urinary tract symptoms -The patient is doing well with this with no overt symptoms and no significant nocturia. - CBC with Differential/Platelet - PSA, total and free - Urinalysis, Complete  5. Annual physical exam -EKG with results pending - BMP8+EGFR - CBC with Differential/Platelet - Hepatic function panel - VITAMIN D 25 Hydroxy (Vit-D Deficiency, Fractures) - Lipid panel - PSA, total and free - Urinalysis, Complete - EKG 12-Lead  Meds ordered this encounter  Medications  . metFORMIN (GLUCOPHAGE) 500 MG tablet    Sig: Take 2 tabs in the AM and 1 tab in the PM    Dispense:  270 tablet    Refill:  3   Patient Instructions                       Medicare Annual Wellness Visit  [facility] and the medical providers at [facility] strive to bring you the best medical care.  In doing so we not only want to address your current medical conditions and concerns but also to detect new conditions early  and prevent illness, disease and health-related problems.    Medicare offers a yearly Wellness Visit which allows our clinical staff to assess your need for preventative services including immunizations, lifestyle education, counseling to decrease risk of preventable diseases and screening for fall risk and other medical concerns.    This visit is provided free of charge (no copay) for all Medicare recipients. The clinical  pharmacists at [facility] have begun to conduct these Wellness Visits which will also include a thorough review of all your medications.    As you primary medical provider recommend that you make an appointment for your Annual Wellness Visit if you have not done so already this year.  You may set up this appointment before you leave today or you may call back ([phone]) and schedule an appointment.  Please make sure when you call that you mention that you are scheduling your Annual Wellness Visit with the clinical pharmacist so that the appointment may be made for the proper length of time.     Continue current medications. Continue good therapeutic lifestyle changes which include good diet and exercise. Fall precautions discussed with patient. If an FOBT was given today- please return it to our front desk. If you are over 50 years old - you may need Prevnar 66 or the adult Pneumonia vaccine.  **Flu shots are available--- please call and schedule a FLU-CLINIC appointment**  After your visit with [place] today you will receive a survey in the mail or online from [employer] regarding your care with [place]. Please take a moment to fill this out. Your feedback is very important to [place] as you can help [place] better understand your patient needs as well as improve your experience and satisfaction. WE CARE ABOUT YOU!!!     Arrie Senate MD

## 2019-02-09 LAB — BMP8+EGFR
BUN/Creatinine Ratio: 14 (ref 10–24)
BUN: 12 mg/dL (ref 8–27)
CO2: 25 mmol/L (ref 20–29)
CREATININE: 0.87 mg/dL (ref 0.76–1.27)
Calcium: 8.7 mg/dL (ref 8.6–10.2)
Chloride: 100 mmol/L (ref 96–106)
GFR calc Af Amer: 102 mL/min/{1.73_m2} (ref >59)
GFR calc non Af Amer: 88 mL/min/{1.73_m2} (ref >59)
Glucose: 161 mg/dL — ABNORMAL HIGH (ref 65–99)
Potassium: 4.6 mmol/L (ref 3.5–5.2)
Sodium: 136 mmol/L (ref 134–144)

## 2019-02-09 LAB — HEPATIC FUNCTION PANEL
ALT: 23 IU/L (ref 0–44)
AST: 18 IU/L (ref 0–40)
Albumin: 4.3 g/dL (ref 3.8–4.8)
Alkaline Phosphatase: 40 IU/L (ref 39–117)
BILIRUBIN TOTAL: 0.5 mg/dL (ref 0.0–1.2)
Bilirubin, Direct: 0.18 mg/dL (ref 0.00–0.40)
Total Protein: 6.6 g/dL (ref 6.0–8.5)

## 2019-02-09 LAB — CBC WITH DIFFERENTIAL/PLATELET
Basophils Absolute: 0 10*3/uL (ref 0.0–0.2)
Basos: 0 %
EOS (ABSOLUTE): 0.1 10*3/uL (ref 0.0–0.4)
EOS: 1 %
Hematocrit: 42.6 % (ref 37.5–51.0)
Hemoglobin: 14.3 g/dL (ref 13.0–17.7)
Immature Grans (Abs): 0 10*3/uL (ref 0.0–0.1)
Immature Granulocytes: 0 %
Lymphocytes Absolute: 2 10*3/uL (ref 0.7–3.1)
Lymphs: 27 %
MCH: 29.2 pg (ref 26.6–33.0)
MCHC: 33.6 g/dL (ref 31.5–35.7)
MCV: 87 fL (ref 79–97)
Monocytes Absolute: 0.7 10*3/uL (ref 0.1–0.9)
Monocytes: 9 %
Neutrophils Absolute: 4.7 10*3/uL (ref 1.4–7.0)
Neutrophils: 63 %
Platelets: 257 10*3/uL (ref 150–450)
RBC: 4.89 x10E6/uL (ref 4.14–5.80)
RDW: 12.6 % (ref 11.6–15.4)
WBC: 7.4 10*3/uL (ref 3.4–10.8)

## 2019-02-09 LAB — LIPID PANEL
CHOL/HDL RATIO: 2.2 ratio (ref 0.0–5.0)
Cholesterol, Total: 89 mg/dL — ABNORMAL LOW (ref 100–199)
HDL: 40 mg/dL (ref >39)
LDL Calculated: 37 mg/dL (ref 0–99)
TRIGLYCERIDES: 59 mg/dL (ref 0–149)
VLDL Cholesterol Cal: 12 mg/dL (ref 5–40)

## 2019-02-09 LAB — VITAMIN D 25 HYDROXY (VIT D DEFICIENCY, FRACTURES): Vit D, 25-Hydroxy: 70.7 ng/mL (ref 30.0–100.0)

## 2019-02-09 LAB — PSA, TOTAL AND FREE
PSA, Free Pct: 49.1 %
PSA, Free: 0.54 ng/mL
Prostate Specific Ag, Serum: 1.1 ng/mL (ref 0.0–4.0)

## 2019-02-13 ENCOUNTER — Encounter: Payer: PPO | Admitting: *Deleted

## 2019-02-18 ENCOUNTER — Telehealth: Payer: Self-pay | Admitting: Cardiology

## 2019-02-18 NOTE — Telephone Encounter (Signed)
Son called- pt has a hacking cough and wanted to know what his father could take OTC- I suggested regular Mucinex and Robitussin.  Kerin Ransom PA-C 02/18/2019 9:52 AM

## 2019-03-06 ENCOUNTER — Other Ambulatory Visit: Payer: Self-pay | Admitting: *Deleted

## 2019-03-06 ENCOUNTER — Encounter: Payer: Self-pay | Admitting: Family Medicine

## 2019-03-06 DIAGNOSIS — J0100 Acute maxillary sinusitis, unspecified: Secondary | ICD-10-CM

## 2019-03-06 MED ORDER — FLUTICASONE PROPIONATE 50 MCG/ACT NA SUSP: 2.0000 | Freq: Every day | NASAL | 3 refills | Status: DC

## 2019-03-06 MED ORDER — DOXYCYCLINE HYCLATE 100 MG PO TABS: 100.0000 mg | ORAL_TABLET | Freq: Two times a day (BID) | ORAL | 0 refills | Status: DC

## 2019-03-25 ENCOUNTER — Other Ambulatory Visit: Payer: Self-pay | Admitting: Family Medicine

## 2019-05-30 ENCOUNTER — Ambulatory Visit (INDEPENDENT_AMBULATORY_CARE_PROVIDER_SITE_OTHER): Payer: PPO | Admitting: Family Medicine

## 2019-05-30 ENCOUNTER — Encounter: Payer: Self-pay | Admitting: Family Medicine

## 2019-05-30 ENCOUNTER — Other Ambulatory Visit: Payer: Self-pay

## 2019-05-30 DIAGNOSIS — E119 Type 2 diabetes mellitus without complications: Secondary | ICD-10-CM

## 2019-05-30 DIAGNOSIS — J301 Allergic rhinitis due to pollen: Secondary | ICD-10-CM

## 2019-05-30 DIAGNOSIS — N400 Enlarged prostate without lower urinary tract symptoms: Secondary | ICD-10-CM | POA: Diagnosis not present

## 2019-05-30 DIAGNOSIS — N5201 Erectile dysfunction due to arterial insufficiency: Secondary | ICD-10-CM

## 2019-05-30 DIAGNOSIS — E559 Vitamin D deficiency, unspecified: Secondary | ICD-10-CM | POA: Diagnosis not present

## 2019-05-30 DIAGNOSIS — E782 Mixed hyperlipidemia: Secondary | ICD-10-CM

## 2019-05-30 MED ORDER — ICOSAPENT ETHYL 1 G PO CAPS: 2.0000 | ORAL_CAPSULE | Freq: Two times a day (BID) | ORAL | 3 refills | Status: DC

## 2019-05-30 NOTE — Patient Instructions (Addendum)
Continue to practice good hand and respiratory hygiene Continue to exercise regularly follow diet closely drink plenty of water monitor blood sugars and keep feet checked Follow-up periodically with cardiology as diabetes continues to be a risk factor for heart disease. Make all efforts to keep weight down through diet and exercise We will send a prescription in again for Vascepa to see if the insurance will cover this as this is a much better omega-3 fatty acid with evidence-based information for reducing heart attack and stroke.  If it does not pay for it get JR Drusilla Kanner and continue with that Follow-up with cardiology every couple of years

## 2019-05-30 NOTE — Addendum Note (Signed)
Addended by: Zannie Cove on: 05/30/2019 03:03 PM   Modules accepted: Orders

## 2019-05-30 NOTE — Progress Notes (Signed)
Virtual Visit Via telephone Note I connected with@ on 05/30/19 by telephone and verified that I am speaking with the correct person or authorized healthcare agent using two identifiers. Dmarius Reeder Mcnamee is currently located at home and there are no unauthorized people in close proximity. I completed this visit while in a private location in my home .  This visit type was conducted due to national recommendations for restrictions regarding the COVID-19 Pandemic (e.g. social distancing).  This format is felt to be most appropriate for this patient at this time.  All issues noted in this document were discussed and addressed.  No physical exam was performed.    I discussed the limitations, risks, security and privacy concerns of performing an evaluation and management service by telephone and the availability of in person appointments. I also discussed with the patient that there may be a patient responsible charge related to this service. The patient expressed understanding and agreed to proceed.   Date:  05/30/2019    ID:  [name] Winquist      [birth year]        [id number]   Patient Care Team Patient Care Team: Chipper Herb, MD as PCP - General (Family Medicine) Larey Dresser, MD as Consulting Physician (Cardiology)  Reason for Visit: Primary Care Follow-up     History of Present Illness & Review of Systems:     SALEM LEMBKE is a 68 y.o. year old male primary care patient that presents today for a telehealth visit.  The patient is pleasant and doing very well.  He exercises and walks regularly.  He has not had an eye exam and he is behind on this because of COVID.  He denies any chest pain pressure tightness or shortness of breath.  He denies any trouble with his stomach including swallowing issues heartburn nausea vomiting diarrhea or blood in the stool.  He is not due a colonoscopy until 2027.  He is passing his water well.  He did see the cardiologist in February of this  year.  He saw 1 of the PAs.  Review of systems as stated, otherwise negative.  The patient does not have symptoms concerning for COVID-19 infection (fever, chills, cough, or new shortness of breath).      Current Medications (Verified) Allergies as of 05/30/2019      Reactions   Penicillins Rash      Medication List       Accurate as of May 30, 2019  2:31 PM. If you have any questions, ask your nurse or doctor.        Aspirin Low Dose 81 MG EC tablet Generic drug:  aspirin Take 81 mg by mouth daily.   atorvastatin 80 MG tablet Commonly known as:  LIPITOR TAKE 1/2 TABLET BY MOUTH AT BEDTIME   azelastine 0.1 % nasal spray Commonly known as:  Astelin 2 sprays each nostril at bedtime   doxycycline 100 MG tablet Commonly known as:  VIBRA-TABS Take 1 tablet (100 mg total) by mouth 2 (two) times daily. 1 po bid   Fish Oil 1000 MG Caps Take 2,000 mg by mouth daily.   fluticasone 50 MCG/ACT nasal spray Commonly known as:  FLONASE Place 2 sprays into both nostrils daily.   glucose blood test strip Commonly known as:  ONE TOUCH ULTRA TEST Test bid, Dx E11.9   Icosapent Ethyl 1 g Caps Commonly known as:  Vascepa Take 2 capsules (2 g total) by mouth  2 (two) times daily.   Januvia 100 MG tablet Generic drug:  sitaGLIPtin TAKE 1 TABLET BY MOUTH EVERY DAY   lisinopril 5 MG tablet Commonly known as:  ZESTRIL TAKE 1 TABLET BY MOUTH EVERY DAY   metFORMIN 500 MG tablet Commonly known as:  GLUCOPHAGE Take 2 tabs in the AM and 1 tab in the PM   OneTouch Verio w/Device Kit 1 Stick by Does not apply route 2 (two) times daily.   sildenafil 20 MG tablet Commonly known as:  REVATIO Take 2-5 tabs po PRN prior to sexual activity.   Vitamin D3 125 MCG (5000 UT) Caps Take 1 capsule by mouth daily.           Allergies (Verified)    Penicillins  Past Medical History Past Medical History:  Diagnosis Date   Chest discomfort 1989   Diverticulosis    DM type 2  (diabetes mellitus, type 2) ([place])    Heart murmur    Hyperlipidemia      Past Surgical History:  Procedure Laterality Date   TONSILLECTOMY AND ADENOIDECTOMY  01/1955    Social History   Socioeconomic History   Marital status: Married    Spouse name: Not on file   Number of children: Not on file   Years of education: Not on file   Highest education level: Not on file  Occupational History   Not on file  Social Needs   Financial resource strain: Not on file   Food insecurity:    Worry: Not on file    Inability: Not on file   Transportation needs:    Medical: Not on file    Non-medical: Not on file  Tobacco Use   Smoking status: Never Smoker   Smokeless tobacco: Never Used  Substance and Sexual Activity   Alcohol use: No   Drug use: No   Sexual activity: Not on file  Lifestyle   Physical activity:    Days per week: Not on file    Minutes per session: Not on file   Stress: Not at all  Relationships   Social connections:    Talks on phone: More than three times a week    Gets together: More than three times a week    Attends religious service: More than 4 times per year    Active member of club or organization: Yes    Attends meetings of clubs or organizations: More than 4 times per year    Relationship status: Married  Other Topics Concern   Not on file  Social History Narrative   Not on file     Family History  Problem Relation Age of Onset   Diabetes Mother    Heart attack Father    Colon cancer Neg Hx       Labs/Other Tests and Data Reviewed:    Wt Readings from Last 3 Encounters:  02/08/19 182 lb (82.6 kg)  12/06/18 171 lb (77.6 kg)  10/05/18 177 lb (80.3 kg)   Temp Readings from Last 3 Encounters:  02/08/19 (!) 96.8 F (36 C) (Oral)  12/06/18 97.7 F (36.5 C) (Oral)  10/05/18 97.9 F (36.6 C) (Oral)   BP Readings from Last 3 Encounters:  02/08/19 136/77  12/06/18 (!) 148/75  10/05/18 138/73   Pulse Readings from  Last 3 Encounters:  02/08/19 67  12/06/18 72  10/05/18 87     Lab Results  Component Value Date   HGBA1C 6.7 10/05/2018   HGBA1C 7.4 (H) 04/27/2018  HGBA1C 7.2 (H) 11/29/2017   Lab Results  Component Value Date   MICROALBUR 20 09/09/2015   LDLCALC 37 02/08/2019   CREATININE 0.87 02/08/2019       Chemistry      Component Value Date/Time   NA 136 02/08/2019 0858   K 4.6 02/08/2019 0858   CL 100 02/08/2019 0858   CO2 25 02/08/2019 0858   BUN 12 02/08/2019 0858   CREATININE 0.87 02/08/2019 0858   CREATININE 0.90 07/06/2013 0842      Component Value Date/Time   CALCIUM 8.7 02/08/2019 0858   ALKPHOS 40 02/08/2019 0858   AST 18 02/08/2019 0858   ALT 23 02/08/2019 0858   BILITOT 0.5 02/08/2019 0858         OBSERVATIONS/ OBJECTIVE:     The patient today says his blood sugars fasting in the morning have been running in the 1 40-1 60 range.  By suppertime it is down to 1 12-1 18 range.  He is actually lost some weight because of increased physical activity and his weight is running about 172 pounds and he is 5 foot 11.  His blood pressure is running generally in the 1 28-1 30 range over the 70s.  He does plan to get his eyes examined and will send [place] a copy of that report from his eye doctor.  Physical exam deferred due to nature of telephonic visit.  ASSESSMENT & PLAN    Time:   Today, I have spent 26 minutes with the patient via telephone discussing the above including Covid precautions.     Visit Diagnoses: 1. Type 2 diabetes mellitus without complication, without long-term current use of insulin ([place]) -Continue current treatment pending results of lab work  2. Vitamin D deficiency -Continue current treatment pending results of lab work  3. Mixed hyperlipidemia -Continue aggressive therapeutic lifestyle changes pending results of lab work and current statin therapy. -Retry Vascepa  4. Seasonal allergic rhinitis due to pollen -New current treatment and avoid  exposure irritating environments is much as possible  5. Benign prostatic hyperplasia, unspecified whether lower urinary tract symptoms present Continue to drink plenty of fluids and stay well-hydrated  6. Erectile dysfunction due to arterial insufficiency -Continue current treatment  Patient Instructions  Continue to practice good hand and respiratory hygiene Continue to exercise regularly follow diet closely drink plenty of water monitor blood sugars and keep feet checked Follow-up periodically with cardiology as diabetes continues to be a risk factor for heart disease. Make all efforts to keep weight down through diet and exercise We will send a prescription in again for Vascepa to see if the insurance will cover this as this is a much better omega-3 fatty acid with evidence-based information for reducing heart attack and stroke.  If it does not pay for it get Laurann Montana and continue with that     The above assessment and management plan was discussed with the patient. The patient verbalized understanding of and has agreed to the management plan. Patient is aware to call the clinic if symptoms persist or worsen. Patient is aware when to return to the clinic for a follow-up visit. Patient educated on when it is appropriate to go to the emergency department.    Chipper Herb, MD [address], [place], [place] [address] Ph [phone]   Arrie Senate MD

## 2019-05-30 NOTE — Addendum Note (Signed)
Addended by: Zannie Cove on: 05/30/2019 03:10 PM   Modules accepted: Orders

## 2019-06-12 ENCOUNTER — Other Ambulatory Visit: Payer: Self-pay | Admitting: Family Medicine

## 2019-06-13 ENCOUNTER — Ambulatory Visit: Payer: PPO | Admitting: Family Medicine

## 2019-07-05 ENCOUNTER — Other Ambulatory Visit: Payer: Self-pay | Admitting: Family Medicine

## 2019-07-05 ENCOUNTER — Encounter: Payer: Self-pay | Admitting: Family Medicine

## 2019-07-17 ENCOUNTER — Encounter: Payer: Self-pay | Admitting: Family Medicine

## 2019-08-24 ENCOUNTER — Encounter: Payer: Self-pay | Admitting: Family Medicine

## 2019-09-27 ENCOUNTER — Other Ambulatory Visit: Payer: Self-pay | Admitting: Nurse Practitioner

## 2019-10-03 ENCOUNTER — Other Ambulatory Visit: Payer: Self-pay

## 2019-10-04 ENCOUNTER — Ambulatory Visit (INDEPENDENT_AMBULATORY_CARE_PROVIDER_SITE_OTHER): Payer: PPO | Admitting: Family Medicine

## 2019-10-04 ENCOUNTER — Encounter: Payer: Self-pay | Admitting: Family Medicine

## 2019-10-04 VITALS — BP 116/62 | HR 63 | Temp 97.7°F | Resp 20 | Ht 70.0 in | Wt 171.0 lb

## 2019-10-04 DIAGNOSIS — E119 Type 2 diabetes mellitus without complications: Secondary | ICD-10-CM | POA: Diagnosis not present

## 2019-10-04 DIAGNOSIS — E559 Vitamin D deficiency, unspecified: Secondary | ICD-10-CM

## 2019-10-04 DIAGNOSIS — E1169 Type 2 diabetes mellitus with other specified complication: Secondary | ICD-10-CM | POA: Diagnosis not present

## 2019-10-04 DIAGNOSIS — Z23 Encounter for immunization: Secondary | ICD-10-CM

## 2019-10-04 DIAGNOSIS — N521 Erectile dysfunction due to diseases classified elsewhere: Secondary | ICD-10-CM | POA: Diagnosis not present

## 2019-10-04 DIAGNOSIS — E785 Hyperlipidemia, unspecified: Secondary | ICD-10-CM

## 2019-10-04 DIAGNOSIS — E782 Mixed hyperlipidemia: Secondary | ICD-10-CM | POA: Diagnosis not present

## 2019-10-04 LAB — BAYER DCA HB A1C WAIVED: HB A1C (BAYER DCA - WAIVED): 6.8 % (ref <7.0)

## 2019-10-04 MED ORDER — SILDENAFIL CITRATE 20 MG PO TABS: ORAL_TABLET | ORAL | 3 refills | Status: DC

## 2019-10-04 NOTE — Progress Notes (Signed)
Subjective:  Patient ID: Peter Mathis, male    DOB: 11-13-1951, 68 y.o.   MRN: [id number]  Patient Care Team: Baruch Gouty, FNP as PCP - General (Family Medicine) Larey Dresser, MD as Consulting Physician (Cardiology)   Chief Complaint:  Medical Management of Chronic Issues (4 mo (moore)), Hyperlipidemia, and Diabetes   HPI: Peter Mathis is a 68 y.o. male presenting on 10/04/2019 for Medical Management of Chronic Issues (4 mo (moore)), Hyperlipidemia, and Diabetes   Peter Mathis is a 68 yo male who presents for a 4 mo follow up for T2DM, HlD, and vit D deficiency. He reports he has been doing well.  He has no complaints or concerns today.   He has been exercising daily by walking. He also stay active throughout the day. He reports he needs to improve his diet and that he has been "splurging" lately. He checks his blood sugar 1-2x a week. He last checked his blood sugar fasting this morning and it was 129. He has not had a recent eye exam and plans to make an appoint with [facility] and have the records sent over.  Hyperlipidemia This is a chronic problem. The problem is controlled. Recent lipid tests were reviewed and are normal. Exacerbating diseases include diabetes. He has no history of chronic renal disease, hypothyroidism, liver disease, obesity or nephrotic syndrome. Pertinent negatives include no chest pain, focal sensory loss, focal weakness, leg pain, myalgias or shortness of breath. Compliance problems include adherence to diet.  Risk factors for coronary artery disease include diabetes mellitus, dyslipidemia and male sex.  Diabetes He presents for his follow-up diabetic visit. He has gestational diabetes mellitus. His disease course has been stable. There are no hypoglycemic associated symptoms. Pertinent negatives for hypoglycemia include no confusion, dizziness or headaches. Pertinent negatives for diabetes include no blurred vision, no chest pain, no fatigue, no foot  paresthesias, no foot ulcerations, no polyuria, no visual change, no weakness and no weight loss. There are no hypoglycemic complications. Symptoms are stable. There are no diabetic complications. Risk factors for coronary artery disease include diabetes mellitus, dyslipidemia and male sex. Current diabetic treatment includes diet and oral agent (dual therapy). Compliance with diabetes treatment: compliant with meds, stuggles with diet compliance. His weight is stable. He is following a low fat/cholesterol and low salt diet. Meal planning includes avoidance of concentrated sweets. He participates in exercise daily. His breakfast blood glucose range is generally 110-130 mg/dl. An ACE inhibitor/angiotensin II receptor blocker is being taken. He does not see a podiatrist.Eye exam is not current (will schedule with [facility] and have report sent).    Relevant past medical, surgical, family, and social history reviewed and updated as indicated.  Allergies and medications reviewed and updated. Date reviewed: Chart in Epic.   Past Medical History:  Diagnosis Date  . Chest discomfort 1989  . Diverticulosis   . DM type 2 (diabetes mellitus, type 2) ([place])   . Heart murmur   . Hyperlipidemia     Past Surgical History:  Procedure Laterality Date  . TONSILLECTOMY AND ADENOIDECTOMY  01/1955    Social History   Socioeconomic History  . Marital status: Married    Spouse name: Not on file  . Number of children: Not on file  . Years of education: Not on file  . Highest education level: Not on file  Occupational History  . Not on file  Social Needs  . Financial resource strain: Not on  file  . Food insecurity    Worry: Not on file    Inability: Not on file  . Transportation needs    Medical: Not on file    Non-medical: Not on file  Tobacco Use  . Smoking status: Never Smoker  . Smokeless tobacco: Never Used  Substance and Sexual Activity  . Alcohol use: No  . Drug use: No  . Sexual  activity: Not on file  Lifestyle  . Physical activity    Days per week: Not on file    Minutes per session: Not on file  . Stress: Not at all  Relationships  . Social connections    Talks on phone: More than three times a week    Gets together: More than three times a week    Attends religious service: More than 4 times per year    Active member of club or organization: Yes    Attends meetings of clubs or organizations: More than 4 times per year    Relationship status: Married  . Intimate partner violence    Fear of current or ex partner: Not on file    Emotionally abused: Not on file    Physically abused: Not on file    Forced sexual activity: Not on file  Other Topics Concern  . Not on file  Social History Narrative  . Not on file    Outpatient Encounter Medications as of 10/04/2019  Medication Sig  . aspirin (ASPIRIN LOW DOSE) 81 MG EC tablet Take 81 mg by mouth daily.    [name] atorvastatin (LIPITOR) 80 MG tablet TAKE 1/2 TABLET BY MOUTH EVERY DAY AT BEDTIME  . azelastine (ASTELIN) 0.1 % nasal spray 2 sprays each nostril at bedtime  . Blood Glucose Monitoring Suppl (ONETOUCH VERIO) w/Device KIT 1 Stick by Does not apply route 2 (two) times daily.  . Cholecalciferol (VITAMIN D3) 5000 UNITS CAPS Take 1 capsule by mouth daily.   . fluticasone (FLONASE) 50 MCG/ACT nasal spray Place 2 sprays into both nostrils daily.  [name] glucose blood (ONE TOUCH ULTRA TEST) test strip Test bid, Dx E11.9  . JANUVIA 100 MG tablet TAKE 1 TABLET BY MOUTH EVERY DAY  . lisinopril (ZESTRIL) 5 MG tablet TAKE 1 TABLET BY MOUTH EVERY DAY  . metFORMIN (GLUCOPHAGE) 500 MG tablet Take 2 tabs in the AM and 1 tab in the PM  . Omega-3 Fatty Acids (FISH OIL) 1000 MG CAPS Take 2,000 mg by mouth daily.  . sildenafil (REVATIO) 20 MG tablet Take 2-5 tabs po PRN prior to sexual activity.  . [DISCONTINUED] sildenafil (REVATIO) 20 MG tablet Take 2-5 tabs po PRN prior to sexual activity.  . [DISCONTINUED] doxycycline  (VIBRA-TABS) 100 MG tablet Take 1 tablet (100 mg total) by mouth 2 (two) times daily. 1 po bid (Patient not taking: Reported on 05/30/2019)  . [DISCONTINUED] Icosapent Ethyl (VASCEPA) 1 g CAPS Take 2 capsules (2 g total) by mouth 2 (two) times daily.   No facility-administered encounter medications on file as of 10/04/2019.     Allergies  Allergen Reactions  . Penicillins Rash    Review of Systems  Constitutional: Negative for activity change, diaphoresis, fatigue, fever, unexpected weight change and weight loss.  HENT: Negative for ear pain, sore throat and trouble swallowing.   Eyes: Negative for blurred vision and visual disturbance.  Respiratory: Negative for cough and shortness of breath.   Cardiovascular: Negative for chest pain, palpitations and leg swelling.  Gastrointestinal: Negative for abdominal pain, blood  in stool, constipation, diarrhea, nausea and vomiting.  Endocrine: Negative for polyuria.  Genitourinary: Negative for decreased urine volume, difficulty urinating, dysuria and hematuria.  Musculoskeletal: Negative for arthralgias and myalgias.  Skin: Negative for rash and wound.  Neurological: Negative for dizziness, focal weakness, syncope, weakness, light-headedness, numbness and headaches.  Psychiatric/Behavioral: Negative for confusion and dysphoric mood.        Objective:  BP 116/62   Pulse 63   Temp 97.7 F (36.5 C)   Resp 20   Ht '5\' 10"'  (1.778 m)   Wt 171 lb (77.6 kg)   SpO2 100%   BMI 24.54 kg/m    Wt Readings from Last 3 Encounters:  10/04/19 171 lb (77.6 kg)  02/08/19 182 lb (82.6 kg)  12/06/18 171 lb (77.6 kg)    Physical Exam Constitutional:      General: He is not in acute distress.    Appearance: Normal appearance. He is normal weight. He is not ill-appearing, toxic-appearing or diaphoretic.  HENT:     Head: Normocephalic and atraumatic.     Right Ear: Tympanic membrane normal.     Left Ear: Tympanic membrane normal.     Nose: Nose  normal. No congestion.     Mouth/Throat:     Mouth: Mucous membranes are moist.     Pharynx: Oropharynx is clear.  Eyes:     Extraocular Movements: Extraocular movements intact.     Pupils: Pupils are equal, round, and reactive to light.  Neck:     Musculoskeletal: Normal range of motion and neck supple. No muscular tenderness.     Vascular: No carotid bruit.  Cardiovascular:     Rate and Rhythm: Normal rate and regular rhythm.     Pulses: Normal pulses.     Heart sounds: Normal heart sounds. No murmur.  Pulmonary:     Effort: Pulmonary effort is normal. No respiratory distress.     Breath sounds: Normal breath sounds.  Abdominal:     General: Bowel sounds are normal.     Palpations: Abdomen is soft.     Tenderness: There is no abdominal tenderness.  Musculoskeletal: Normal range of motion.     Right lower leg: No edema.     Left lower leg: No edema.  Lymphadenopathy:     Cervical: No cervical adenopathy.  Skin:    General: Skin is warm and dry.     Capillary Refill: Capillary refill takes less than 2 seconds.  Neurological:     Mental Status: He is alert and oriented to person, place, and time. Mental status is at baseline.  Psychiatric:        Mood and Affect: Mood normal.        Behavior: Behavior normal.        Thought Content: Thought content normal.        Judgment: Judgment normal.     Results for orders placed or performed in visit on 02/08/19  Microscopic Examination   URINE  Result Value Ref Range   WBC, UA None seen 0 - 5 /hpf   RBC, UA None seen 0 - 2 /hpf   Epithelial Cells (non renal) None seen 0 - 10 /hpf   Renal Epithel, UA None seen None seen /hpf   Bacteria, UA None seen None seen/Few  BMP8+EGFR  Result Value Ref Range   Glucose 161 (H) 65 - 99 mg/dL   BUN 12 8 - 27 mg/dL   Creatinine, Ser 0.87 0.76 - 1.27 mg/dL  GFR calc non Af Amer 88 >59 mL/min/1.73   GFR calc Af Amer 102 >59 mL/min/1.73   BUN/Creatinine Ratio 14 10 - 24   Sodium 136 134 -  144 mmol/L   Potassium 4.6 3.5 - 5.2 mmol/L   Chloride 100 96 - 106 mmol/L   CO2 25 20 - 29 mmol/L   Calcium 8.7 8.6 - 10.2 mg/dL  CBC with Differential/Platelet  Result Value Ref Range   WBC 7.4 3.4 - 10.8 x10E3/uL   RBC 4.89 4.14 - 5.80 x10E6/uL   Hemoglobin 14.3 13.0 - 17.7 g/dL   Hematocrit 42.6 37.5 - 51.0 %   MCV 87 79 - 97 fL   MCH 29.2 26.6 - 33.0 pg   MCHC 33.6 31.5 - 35.7 g/dL   RDW 12.6 11.6 - 15.4 %   Platelets 257 150 - 450 x10E3/uL   Neutrophils 63 Not Estab. %   Lymphs 27 Not Estab. %   Monocytes 9 Not Estab. %   Eos 1 Not Estab. %   Basos 0 Not Estab. %   Neutrophils Absolute 4.7 1.4 - 7.0 x10E3/uL   Lymphocytes Absolute 2.0 0.7 - 3.1 x10E3/uL   Monocytes Absolute 0.7 0.1 - 0.9 x10E3/uL   EOS (ABSOLUTE) 0.1 0.0 - 0.4 x10E3/uL   Basophils Absolute 0.0 0.0 - 0.2 x10E3/uL   Immature Granulocytes 0 Not Estab. %   Immature Grans (Abs) 0.0 0.0 - 0.1 x10E3/uL  Hepatic function panel  Result Value Ref Range   Total Protein 6.6 6.0 - 8.5 g/dL   Albumin 4.3 3.8 - 4.8 g/dL   Bilirubin Total 0.5 0.0 - 1.2 mg/dL   Bilirubin, Direct 0.18 0.00 - 0.40 mg/dL   Alkaline Phosphatase 40 39 - 117 IU/L   AST 18 0 - 40 IU/L   ALT 23 0 - 44 IU/L  VITAMIN D 25 Hydroxy (Vit-D Deficiency, Fractures)  Result Value Ref Range   Vit D, 25-Hydroxy 70.7 30.0 - 100.0 ng/mL  Lipid panel  Result Value Ref Range   Cholesterol, Total 89 (L) 100 - 199 mg/dL   Triglycerides 59 0 - 149 mg/dL   HDL 40 >39 mg/dL   VLDL Cholesterol Cal 12 5 - 40 mg/dL   LDL Calculated 37 0 - 99 mg/dL   Chol/HDL Ratio 2.2 0.0 - 5.0 ratio  PSA, total and free  Result Value Ref Range   Prostate Specific Ag, Serum 1.1 0.0 - 4.0 ng/mL   PSA, Free 0.54 N/A ng/mL   PSA, Free Pct 49.1 %  Urinalysis, Complete  Result Value Ref Range   Specific Gravity, UA <1.005 (L) 1.005 - 1.030   pH, UA 6.5 5.0 - 7.5   Color, UA Yellow Yellow   Appearance Ur Clear Clear   Leukocytes, UA Negative Negative   Protein, UA  Negative Negative/Trace   Glucose, UA Negative Negative   Ketones, UA Negative Negative   RBC, UA Negative Negative   Bilirubin, UA Negative Negative   Urobilinogen, Ur 0.2 0.2 - 1.0 mg/dL   Nitrite, UA Negative Negative   Microscopic Examination See below:        Pertinent labs & imaging results that were available during my care of the patient were reviewed by me and considered in my medical decision making.  Assessment & Plan:  Peter Mathis was seen today for medical management of chronic issues, hyperlipidemia and diabetes.  Diagnoses and all orders for this visit:  Type 2 diabetes mellitus without complication, without long-term current use of insulin ([place])  Lab Results  Component Value Date   HGBA1C 6.7 10/05/2018   HGBA1C 7.4 (H) 04/27/2018   HGBA1C 7.2 (H) 11/29/2017    Diabetes Control: well controlled Instruction/counseling given: reminded to get eye exam, reminded to bring blood glucose meter & log to each visit, discussed foot care, discussed diet and provided printed educational material   1.  Rx changes: none, AIC 6.8 today 2.  Education: Reviewed 'ABCs' of diabetes management (respective goals in parentheses):  A1C (<7), blood pressure (<130/80), BMI (<25), and cholesterol (LDL <100). 3.  Discussed pathophysiology of DM; difference between type 1 and type 2 DM. 4.  CHO counting diet discussed.  Reviewed CHO amount in various foods and how to read nutrition labels.  Discussed recommended serving sizes.  5.  Recommend check BG a few times a week and record readings to bring to next appointment. Report persistent high or low readings. 6.  Recommended increase physical activity - goal is 150 minutes per week and advance as tolerated. 7.  Adequate sleep, at least 6-8 hours per night.  8.  Smoking cessation.  9.  Follow up: 4 months   A1C results from today shared and discussed.  -     CBC with Differential/Platelet -     CMP14+EGFR -     Bayer DCA Hb A1c Waived   Vitamin D deficiency Labs pending. Continue repletion therapy. If indicated, will change repletion dosage. Eat foods rich in Vit D including milk, orange juice, yogurt with vitamin D added, salmon or mackerel, canned tuna fish, cereals with vitamin D added, and cod liver oil. Get out in the sun but make sure to wear at least SPF 30 sunscreen.  -     CBC with Differential/Platelet -     VITAMIN D 25 Hydroxy (Vit-D Deficiency, Fractures)  Erectile dysfunction due to type 2 diabetes mellitus ([place]) Continue medications as needed. Reports new or worsening symptoms or medication side effects.  -     sildenafil (REVATIO) 20 MG tablet; Take 2-5 tabs po PRN prior to sexual activity.  Hyperlipidemia associated with type 2 diabetes mellitus ([place]) Diet encouraged - increase intake of fresh fruits and vegetables, increase intake of lean proteins. Bake, broil, or grill foods. Avoid fried, greasy, and fatty foods. Avoid fast foods. Increase intake of fiber-rich whole grains. Exercise encouraged - at least 150 minutes per week and advance as tolerated.  Goal BMI < 25. Continue medications as prescribed. Follow up in 3-6 months as discussed.  -     CBC with Differential/Platelet -     CMP14+EGFR -     Lipid panel   Flu vaccination given at today's visit.   Continue all other maintenance medications.  Follow up plan: Return in about 3 months (around 01/04/2020), or if symptoms worsen or fail to improve, for DM.  Continue healthy lifestyle choices, including diet (rich in fruits, vegetables, and lean proteins, and low in salt and simple carbohydrates) and exercise (at least 30 minutes of moderate physical activity daily).  Educational handout given for health maintenance.   The above assessment and management plan was discussed with the patient. The patient verbalized understanding of and has agreed to the management plan. Patient is aware to call the clinic if they develop any new symptoms or if symptoms  persist or worsen. Patient is aware when to return to the clinic for a follow-up visit. Patient educated on when it is appropriate to go to the emergency department.   Marjorie Smolder, RN, FNP  student [facility] [place], [facility] [address] [place], [place] [address] [phone]

## 2019-10-04 NOTE — Patient Instructions (Signed)

## 2019-10-05 LAB — CBC WITH DIFFERENTIAL/PLATELET
Basophils Absolute: 0 10*3/uL (ref 0.0–0.2)
Basos: 0 %
EOS (ABSOLUTE): 0.1 10*3/uL (ref 0.0–0.4)
Eos: 2 %
Hematocrit: 43.5 % (ref 37.5–51.0)
Hemoglobin: 14.2 g/dL (ref 13.0–17.7)
Immature Grans (Abs): 0 10*3/uL (ref 0.0–0.1)
Immature Granulocytes: 0 %
Lymphocytes Absolute: 2 10*3/uL (ref 0.7–3.1)
Lymphs: 29 %
MCH: 28.2 pg (ref 26.6–33.0)
MCHC: 32.6 g/dL (ref 31.5–35.7)
MCV: 87 fL (ref 79–97)
Monocytes Absolute: 0.6 10*3/uL (ref 0.1–0.9)
Monocytes: 8 %
Neutrophils Absolute: 4.2 10*3/uL (ref 1.4–7.0)
Neutrophils: 61 %
Platelets: 265 10*3/uL (ref 150–450)
RBC: 5.03 x10E6/uL (ref 4.14–5.80)
RDW: 12.2 % (ref 11.6–15.4)
WBC: 7 10*3/uL (ref 3.4–10.8)

## 2019-10-05 LAB — LIPID PANEL
Chol/HDL Ratio: 2.1 ratio (ref 0.0–5.0)
Cholesterol, Total: 78 mg/dL — ABNORMAL LOW (ref 100–199)
HDL: 38 mg/dL — ABNORMAL LOW (ref >39)
LDL Chol Calc (NIH): 27 mg/dL (ref 0–99)
Triglycerides: 48 mg/dL (ref 0–149)
VLDL Cholesterol Cal: 13 mg/dL (ref 5–40)

## 2019-10-05 LAB — CMP14+EGFR
ALT: 18 IU/L (ref 0–44)
AST: 17 IU/L (ref 0–40)
Albumin/Globulin Ratio: 1.8 (ref 1.2–2.2)
Albumin: 4.1 g/dL (ref 3.8–4.8)
Alkaline Phosphatase: 46 IU/L (ref 39–117)
BUN/Creatinine Ratio: 15 (ref 10–24)
BUN: 13 mg/dL (ref 8–27)
Bilirubin Total: 0.4 mg/dL (ref 0.0–1.2)
CO2: 27 mmol/L (ref 20–29)
Calcium: 9.6 mg/dL (ref 8.6–10.2)
Chloride: 101 mmol/L (ref 96–106)
Creatinine, Ser: 0.85 mg/dL (ref 0.76–1.27)
GFR calc Af Amer: 103 mL/min/{1.73_m2} (ref >59)
GFR calc non Af Amer: 89 mL/min/{1.73_m2} (ref >59)
Globulin, Total: 2.3 g/dL (ref 1.5–4.5)
Glucose: 132 mg/dL — ABNORMAL HIGH (ref 65–99)
Potassium: 5.4 mmol/L — ABNORMAL HIGH (ref 3.5–5.2)
Sodium: 137 mmol/L (ref 134–144)
Total Protein: 6.4 g/dL (ref 6.0–8.5)

## 2019-10-10 ENCOUNTER — Other Ambulatory Visit: Payer: PPO

## 2019-10-10 ENCOUNTER — Other Ambulatory Visit: Payer: Self-pay

## 2019-10-10 DIAGNOSIS — E559 Vitamin D deficiency, unspecified: Secondary | ICD-10-CM

## 2019-10-10 DIAGNOSIS — E782 Mixed hyperlipidemia: Secondary | ICD-10-CM | POA: Diagnosis not present

## 2019-10-11 LAB — CMP14+EGFR
ALT: 23 [IU]/L (ref 0–44)
AST: 18 [IU]/L (ref 0–40)
Albumin/Globulin Ratio: 1.6 (ref 1.2–2.2)
Albumin: 4.1 g/dL (ref 3.8–4.8)
Alkaline Phosphatase: 49 [IU]/L (ref 39–117)
BUN/Creatinine Ratio: 13 (ref 10–24)
BUN: 12 mg/dL (ref 8–27)
Bilirubin Total: 0.5 mg/dL (ref 0.0–1.2)
CO2: 25 mmol/L (ref 20–29)
Calcium: 9.3 mg/dL (ref 8.6–10.2)
Chloride: 103 mmol/L (ref 96–106)
Creatinine, Ser: 0.9 mg/dL (ref 0.76–1.27)
GFR calc Af Amer: 100 mL/min/{1.73_m2}
GFR calc non Af Amer: 87 mL/min/{1.73_m2}
Globulin, Total: 2.5 g/dL (ref 1.5–4.5)
Glucose: 148 mg/dL — ABNORMAL HIGH (ref 65–99)
Potassium: 4.8 mmol/L (ref 3.5–5.2)
Sodium: 141 mmol/L (ref 134–144)
Total Protein: 6.6 g/dL (ref 6.0–8.5)

## 2019-10-11 LAB — VITAMIN D 25 HYDROXY (VIT D DEFICIENCY, FRACTURES): Vit D, 25-Hydroxy: 61.2 ng/mL (ref 30.0–100.0)

## 2019-12-14 ENCOUNTER — Other Ambulatory Visit: Payer: Self-pay | Admitting: Family Medicine

## 2019-12-25 ENCOUNTER — Other Ambulatory Visit: Payer: Self-pay | Admitting: Family Medicine

## 2020-01-08 LAB — HM DIABETES EYE EXAM

## 2020-01-19 ENCOUNTER — Encounter: Payer: Self-pay | Admitting: Family Medicine

## 2020-01-19 DIAGNOSIS — E11319 Type 2 diabetes mellitus with unspecified diabetic retinopathy without macular edema: Secondary | ICD-10-CM | POA: Insufficient documentation

## 2020-02-06 ENCOUNTER — Other Ambulatory Visit: Payer: Self-pay

## 2020-02-06 ENCOUNTER — Telehealth: Payer: Self-pay | Admitting: Family Medicine

## 2020-02-06 ENCOUNTER — Other Ambulatory Visit: Payer: Self-pay | Admitting: Family Medicine

## 2020-02-07 ENCOUNTER — Encounter: Payer: Self-pay | Admitting: Family Medicine

## 2020-02-07 ENCOUNTER — Ambulatory Visit (INDEPENDENT_AMBULATORY_CARE_PROVIDER_SITE_OTHER): Payer: PPO | Admitting: Family Medicine

## 2020-02-07 VITALS — BP 126/64 | HR 68 | Temp 98.4°F | Resp 20 | Ht 70.0 in | Wt 173.0 lb

## 2020-02-07 DIAGNOSIS — N400 Enlarged prostate without lower urinary tract symptoms: Secondary | ICD-10-CM

## 2020-02-07 DIAGNOSIS — E1169 Type 2 diabetes mellitus with other specified complication: Secondary | ICD-10-CM

## 2020-02-07 DIAGNOSIS — E785 Hyperlipidemia, unspecified: Secondary | ICD-10-CM | POA: Diagnosis not present

## 2020-02-07 DIAGNOSIS — E119 Type 2 diabetes mellitus without complications: Secondary | ICD-10-CM

## 2020-02-07 LAB — BAYER DCA HB A1C WAIVED: HB A1C (BAYER DCA - WAIVED): 7.6 % — ABNORMAL HIGH (ref <7.0)

## 2020-02-07 NOTE — Patient Instructions (Signed)

## 2020-02-07 NOTE — Progress Notes (Signed)
Subjective:  Patient ID: Peter Mathis, male    DOB: [birth year], 69 y.o.   MRN: [id number]  Patient Care Team: Baruch Gouty, FNP as PCP - General (Family Medicine) Larey Dresser, MD as Consulting Physician (Cardiology)   Chief Complaint:  Medical Management of Chronic Issues (4 mo ), Diabetes, and Hyperlipidemia   HPI: Peter Mathis is a 69 y.o. male presenting on 02/07/2020 for Medical Management of Chronic Issues (4 mo ), Diabetes, and Hyperlipidemia   1. Type 2 diabetes mellitus without complication, without long-term current use of insulin (HCC) Compliant with medications. Has not been following diet as strictly as he should. States he does have some elevated readings in the morning. No polyuria, polyphagia, or polydipsia.   2. Hyperlipidemia associated with type 2 diabetes mellitus ([place]) Compliant with medications without associated side effects. Does stay active. Has not been strict with diet over last several months.   3. Benign prostatic hyperplasia without lower urinary tract symptoms No nocturia, hesitancy, urgency, weak stream, or dribbling. No rectal or scrotal pressure.      Relevant past medical, surgical, family, and social history reviewed and updated as indicated.  Allergies and medications reviewed and updated. Date reviewed: Chart in Epic.   Past Medical History:  Diagnosis Date  . Chest discomfort 1989  . Diverticulosis   . DM type 2 (diabetes mellitus, type 2) ([place])   . Heart murmur   . Hyperlipidemia     Past Surgical History:  Procedure Laterality Date  . TONSILLECTOMY AND ADENOIDECTOMY  01/1955    Social History   Socioeconomic History  . Marital status: Married    Spouse name: Not on file  . Number of children: Not on file  . Years of education: Not on file  . Highest education level: Not on file  Occupational History  . Not on file  Tobacco Use  . Smoking status: Never Smoker  . Smokeless tobacco: Never Used  Substance  and Sexual Activity  . Alcohol use: No  . Drug use: No  . Sexual activity: Not on file  Other Topics Concern  . Not on file  Social History Narrative  . Not on file   Social Determinants of Health   Financial Resource Strain:   . Difficulty of Paying Living Expenses: Not on file  Food Insecurity:   . Worried About Charity fundraiser in the Last Year: Not on file  . Ran Out of Food in the Last Year: Not on file  Transportation Needs:   . Lack of Transportation (Medical): Not on file  . Lack of Transportation (Non-Medical): Not on file  Physical Activity:   . Days of Exercise per Week: Not on file  . Minutes of Exercise per Session: Not on file  Stress:   . Feeling of Stress : Not on file  Social Connections:   . Frequency of Communication with Friends and Family: Not on file  . Frequency of Social Gatherings with Friends and Family: Not on file  . Attends Religious Services: Not on file  . Active Member of Clubs or Organizations: Not on file  . Attends Archivist Meetings: Not on file  . Marital Status: Not on file  Intimate Partner Violence:   . Fear of Current or Ex-Partner: Not on file  . Emotionally Abused: Not on file  . Physically Abused: Not on file  . Sexually Abused: Not on file    Outpatient Encounter Medications as of  02/07/2020  Medication Sig  . aspirin (ASPIRIN LOW DOSE) 81 MG EC tablet Take 81 mg by mouth daily.    [name] atorvastatin (LIPITOR) 80 MG tablet TAKE 1/2 TABLET BY MOUTH EVERY DAY AT BEDTIME  . azelastine (ASTELIN) 0.1 % nasal spray 2 sprays each nostril at bedtime  . Blood Glucose Monitoring Suppl (ONETOUCH VERIO) w/Device KIT 1 Stick by Does not apply route 2 (two) times daily.  . Cholecalciferol (VITAMIN D3) 5000 UNITS CAPS Take 1 capsule by mouth daily.   . fluticasone (FLONASE) 50 MCG/ACT nasal spray Place 2 sprays into both nostrils daily.  [name] glucose blood (ONE TOUCH ULTRA TEST) test strip Test bid, Dx E11.9  . JANUVIA 100 MG tablet  TAKE 1 TABLET BY MOUTH EVERY DAY  . lisinopril (ZESTRIL) 5 MG tablet TAKE 1 TABLET BY MOUTH EVERY DAY  . metFORMIN (GLUCOPHAGE) 500 MG tablet TAKE 2 TABS IN THE AM AND 1 TAB IN THE PM  . Omega-3 Fatty Acids (FISH OIL) 1000 MG CAPS Take 2,000 mg by mouth daily.  . sildenafil (REVATIO) 20 MG tablet Take 2-5 tabs po PRN prior to sexual activity.   No facility-administered encounter medications on file as of 02/07/2020.    Allergies  Allergen Reactions  . Penicillins Rash    Review of Systems  Constitutional: Negative for activity change, appetite change, chills, diaphoresis, fever and unexpected weight change.  HENT: Negative.   Eyes: Negative.  Negative for photophobia and visual disturbance.  Respiratory: Negative for cough and chest tightness.   Cardiovascular: Negative for palpitations and leg swelling.  Gastrointestinal: Negative for abdominal pain, blood in stool, constipation, diarrhea, nausea and vomiting.  Endocrine: Negative.  Negative for cold intolerance and heat intolerance.  Genitourinary: Negative for decreased urine volume, difficulty urinating, dysuria, frequency and urgency.  Musculoskeletal: Negative for arthralgias.  Skin: Negative.   Allergic/Immunologic: Negative.   Neurological: Negative for syncope, facial asymmetry, light-headedness and numbness.  Hematological: Negative.   Psychiatric/Behavioral: Negative for hallucinations, sleep disturbance and suicidal ideas.  All other systems reviewed and are negative.       Objective:  BP 126/64   Pulse 68   Temp 98.4 F (36.9 C)   Resp 20   Ht 5' 10" (1.778 m)   Wt 173 lb (78.5 kg)   SpO2 100%   BMI 24.82 kg/m    Wt Readings from Last 3 Encounters:  02/07/20 173 lb (78.5 kg)  10/04/19 171 lb (77.6 kg)  02/08/19 182 lb (82.6 kg)    Physical Exam Vitals and nursing note reviewed.  Constitutional:      General: He is not in acute distress.    Appearance: Normal appearance. He is well-developed,  well-groomed and normal weight. He is not ill-appearing, toxic-appearing or diaphoretic.  HENT:     Head: Normocephalic and atraumatic.     Jaw: There is normal jaw occlusion.     Right Ear: Hearing normal.     Left Ear: Hearing normal.     Nose: Nose normal.     Mouth/Throat:     Lips: Pink.     Mouth: Mucous membranes are moist.     Pharynx: Oropharynx is clear. Uvula midline.  Eyes:     General: Lids are normal.     Extraocular Movements: Extraocular movements intact.     Conjunctiva/sclera: Conjunctivae normal.     Pupils: Pupils are equal, round, and reactive to light.  Neck:     Thyroid: No thyroid mass, thyromegaly or thyroid tenderness.  Vascular: No carotid bruit or JVD.     Trachea: Trachea and phonation normal.  Cardiovascular:     Rate and Rhythm: Normal rate and regular rhythm.     Chest Wall: PMI is not displaced.     Pulses: Normal pulses.     Heart sounds: Normal heart sounds. No murmur. No friction rub. No gallop.   Pulmonary:     Effort: Pulmonary effort is normal. No respiratory distress.     Breath sounds: Normal breath sounds. No wheezing.  Abdominal:     General: Bowel sounds are normal. There is no distension or abdominal bruit.     Palpations: Abdomen is soft. There is no hepatomegaly or splenomegaly.     Tenderness: There is no abdominal tenderness. There is no right CVA tenderness or left CVA tenderness.     Hernia: No hernia is present.  Musculoskeletal:        General: Normal range of motion.     Cervical back: Normal range of motion and neck supple.     Right lower leg: No edema.     Left lower leg: No edema.  Lymphadenopathy:     Cervical: No cervical adenopathy.  Skin:    General: Skin is warm and dry.     Capillary Refill: Capillary refill takes less than 2 seconds.     Coloration: Skin is not cyanotic, jaundiced or pale.     Findings: No rash.  Neurological:     General: No focal deficit present.     Mental Status: He is alert and  oriented to person, place, and time.     Cranial Nerves: Cranial nerves are intact. No cranial nerve deficit.     Sensory: Sensation is intact. No sensory deficit.     Motor: Motor function is intact. No weakness.     Coordination: Coordination is intact. Coordination normal.     Gait: Gait is intact. Gait normal.     Deep Tendon Reflexes: Reflexes are normal and symmetric. Reflexes normal.  Psychiatric:        Attention and Perception: Attention and perception normal.        Mood and Affect: Mood and affect normal.        Speech: Speech normal.        Behavior: Behavior normal. Behavior is cooperative.        Thought Content: Thought content normal.        Cognition and Memory: Cognition and memory normal.        Judgment: Judgment normal.     Results for orders placed or performed in visit on 01/19/20  HM DIABETES EYE EXAM  Result Value Ref Range   HM Diabetic Eye Exam Retinopathy (A) No Retinopathy       Pertinent labs & imaging results that were available during my care of the patient were reviewed by me and considered in my medical decision making.  Assessment & Plan:  Hilman was seen today for medical management of chronic issues, diabetes and hyperlipidemia.  Diagnoses and all orders for this visit:  Type 2 diabetes mellitus without complication, without long-term current use of insulin (HCC) A1C 7.6 today. Pt agrees to make dietary and exercises changes. Will not adjust medications at this time. Will recheck at next visit and adjust therapy if warranted.  -     CMP14+EGFR -     CBC with Differential/Platelet -     Bayer DCA Hb A1c Waived  Hyperlipidemia associated with type 2 diabetes mellitus (  [place]) Diet encouraged - increase intake of fresh fruits and vegetables, increase intake of lean proteins. Bake, broil, or grill foods. Avoid fried, greasy, and fatty foods. Avoid fast foods. Increase intake of fiber-rich whole grains. Exercise encouraged - at least 150 minutes  per week and advance as tolerated.  Goal BMI < 25. Continue medications as prescribed. Follow up in 3-6 months as discussed.  -     CBC with Differential/Platelet -     Lipid panel  Benign prostatic hyperplasia without lower urinary tract symptoms Asymptomatic. Will check PSA today. Pt aware to report any new symptoms.  -     CBC with Differential/Platelet -     PSA, total and free     Continue all other maintenance medications.  Follow up plan: Return in about 3 months (around 05/06/2020), or if symptoms worsen or fail to improve, for DM.  Continue healthy lifestyle choices, including diet (rich in fruits, vegetables, and lean proteins, and low in salt and simple carbohydrates) and exercise (at least 30 minutes of moderate physical activity daily).  Educational handout given for DM  The above assessment and management plan was discussed with the patient. The patient verbalized understanding of and has agreed to the management plan. Patient is aware to call the clinic if they develop any new symptoms or if symptoms persist or worsen. Patient is aware when to return to the clinic for a follow-up visit. Patient educated on when it is appropriate to go to the emergency department.   Monia Pouch, FNP-C [facility] [phone]

## 2020-02-08 LAB — CMP14+EGFR
ALT: 21 IU/L (ref 0–44)
AST: 17 IU/L (ref 0–40)
Albumin/Globulin Ratio: 1.7 (ref 1.2–2.2)
Albumin: 4.3 g/dL (ref 3.8–4.8)
Alkaline Phosphatase: 53 IU/L (ref 39–117)
BUN/Creatinine Ratio: 16 (ref 10–24)
BUN: 15 mg/dL (ref 8–27)
Bilirubin Total: 0.8 mg/dL (ref 0.0–1.2)
CO2: 24 mmol/L (ref 20–29)
Calcium: 9.8 mg/dL (ref 8.6–10.2)
Chloride: 98 mmol/L (ref 96–106)
Creatinine, Ser: 0.93 mg/dL (ref 0.76–1.27)
GFR calc Af Amer: 96 mL/min/{1.73_m2} (ref >59)
GFR calc non Af Amer: 83 mL/min/{1.73_m2} (ref >59)
Globulin, Total: 2.6 g/dL (ref 1.5–4.5)
Glucose: 164 mg/dL — ABNORMAL HIGH (ref 65–99)
Potassium: 4.9 mmol/L (ref 3.5–5.2)
Sodium: 136 mmol/L (ref 134–144)
Total Protein: 6.9 g/dL (ref 6.0–8.5)

## 2020-02-08 LAB — LIPID PANEL
Chol/HDL Ratio: 2.1 ratio (ref 0.0–5.0)
Cholesterol, Total: 83 mg/dL — ABNORMAL LOW (ref 100–199)
HDL: 40 mg/dL (ref >39)
LDL Chol Calc (NIH): 30 mg/dL (ref 0–99)
Triglycerides: 53 mg/dL (ref 0–149)
VLDL Cholesterol Cal: 13 mg/dL (ref 5–40)

## 2020-02-08 LAB — PSA, TOTAL AND FREE
PSA, Free Pct: 55.6 %
PSA, Free: 0.5 ng/mL
Prostate Specific Ag, Serum: 0.9 ng/mL (ref 0.0–4.0)

## 2020-02-08 LAB — CBC WITH DIFFERENTIAL/PLATELET
Basophils Absolute: 0 10*3/uL (ref 0.0–0.2)
Basos: 0 %
EOS (ABSOLUTE): 0.1 10*3/uL (ref 0.0–0.4)
Eos: 1 %
Hematocrit: 44.5 % (ref 37.5–51.0)
Hemoglobin: 15.1 g/dL (ref 13.0–17.7)
Immature Grans (Abs): 0 10*3/uL (ref 0.0–0.1)
Immature Granulocytes: 0 %
Lymphocytes Absolute: 2.3 10*3/uL (ref 0.7–3.1)
Lymphs: 31 %
MCH: 29.5 pg (ref 26.6–33.0)
MCHC: 33.9 g/dL (ref 31.5–35.7)
MCV: 87 fL (ref 79–97)
Monocytes Absolute: 0.6 10*3/uL (ref 0.1–0.9)
Monocytes: 8 %
Neutrophils Absolute: 4.4 10*3/uL (ref 1.4–7.0)
Neutrophils: 60 %
Platelets: 281 10*3/uL (ref 150–450)
RBC: 5.11 x10E6/uL (ref 4.14–5.80)
RDW: 12.4 % (ref 11.6–15.4)
WBC: 7.5 10*3/uL (ref 3.4–10.8)

## 2020-03-06 ENCOUNTER — Encounter: Payer: Self-pay | Admitting: Family Medicine

## 2020-03-08 ENCOUNTER — Other Ambulatory Visit: Payer: Self-pay | Admitting: Family Medicine

## 2020-03-08 DIAGNOSIS — K458 Other specified abdominal hernia without obstruction or gangrene: Secondary | ICD-10-CM

## 2020-03-14 ENCOUNTER — Encounter: Payer: Self-pay | Admitting: General Surgery

## 2020-03-14 ENCOUNTER — Other Ambulatory Visit: Payer: Self-pay

## 2020-03-14 ENCOUNTER — Ambulatory Visit (INDEPENDENT_AMBULATORY_CARE_PROVIDER_SITE_OTHER): Payer: PPO | Admitting: General Surgery

## 2020-03-14 VITALS — BP 138/75 | HR 70 | Temp 98.1°F | Resp 12 | Ht 71.0 in | Wt 171.0 lb

## 2020-03-14 DIAGNOSIS — K4090 Unilateral inguinal hernia, without obstruction or gangrene, not specified as recurrent: Secondary | ICD-10-CM

## 2020-03-14 NOTE — Progress Notes (Signed)
[facility] History and Physical  Reason for Referral: Left inguinal hernia  Referring Physician:  Linda [name] FNP   Chief Complaint    New Patient (Initial Visit)      Peter Mathis is a 69 y.o. male.  HPI: Mr. 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 The pop was in the groin area on the left and ever since that time he has felt some discomfort and bulge.  He says the bulge does go in but gets worse again when he is working or on his feet. He says that some arthritis cream "Blue EMU" cream has helped soothe the area some.  He says that he has had no nausea or vomiting or obstructive type symptoms. His past PCP, Dr. Moore had told him at one point he had a hernia and may need to get this repaired, but he has had no issues with the area until the past 2 weeks.  He describes the pain as stabbing and pulling at times especially with stretching out.  He has limited is work some due to this discomfort and concern.  He is active and wants to stay active.  Right now he has completed his work on the peach trees and feels like he has time to have a hernia repair.   He says that he may have felt a small knot on the right previously but that this area has not been hurting.   Past Medical History:  Diagnosis Date  . Chest discomfort 1989  . Diverticulosis   . DM type 2 (diabetes mellitus, type 2) (HCC)   . Heart murmur   . Hyperlipidemia     Past Surgical History:  Procedure Laterality Date  . TONSILLECTOMY AND ADENOIDECTOMY  01/1955    Family History  Problem Relation Age of Onset  . Diabetes Mother   . Heart attack Father   . Colon cancer Neg Hx     Social History   Tobacco Use  . Smoking status: Never Smoker  . Smokeless tobacco: Never Used  Substance Use Topics  . Alcohol use: No  . Drug use: No    Medications: I have reviewed the patient's  current medications. Allergies as of 03/14/2020      Reactions   Penicillins Rash      Medication List       Accurate as of March 14, 2020  9:26 AM. If you have any questions, ask your nurse or doctor.        Aspirin Low Dose 81 MG EC tablet Generic drug: aspirin Take 81 mg by mouth daily.   atorvastatin 80 MG tablet Commonly known as: LIPITOR TAKE 1/2 TABLET BY MOUTH EVERY DAY AT BEDTIME   azelastine 0.1 % nasal spray Commonly known as: Astelin 2 sprays each nostril at bedtime   Fish Oil 1000 MG Caps Take 2,000 mg by mouth daily.   fluticasone 50 MCG/ACT nasal spray Commonly known as: FLONASE Place 2 sprays into both nostrils daily.   glucose blood test strip Commonly known as: ONE TOUCH ULTRA TEST Test bid, Dx E11.9   Januvia 100 MG tablet Generic drug: sitaGLIPtin TAKE 1 TABLET BY MOUTH EVERY DAY   lisinopril 5 MG tablet Commonly known as: ZESTRIL TAKE 1 TABLET BY MOUTH EVERY DAY   metFORMIN 500 MG tablet Commonly known as: GLUCOPHAGE TAKE 2 TABS IN THE   AM AND 1 TAB IN THE PM   OneTouch Verio w/Device Kit 1 Stick by Does not apply route 2 (two) times daily.   sildenafil 20 MG tablet Commonly known as: REVATIO Take 2-5 tabs po PRN prior to sexual activity.   Vitamin D3 125 MCG (5000 UT) Caps Take 1 capsule by mouth daily.        ROS:  A comprehensive review of systems was negative except for: Gastrointestinal: positive for left groin pain and associated abdominal pain  Blood pressure 138/75, pulse 70, temperature 98.1 F (36.7 C), temperature source Oral, resp. rate 12, height 5' 11" (1.803 m), weight 171 lb (77.6 kg), SpO2 96 %. Physical Exam Vitals reviewed.  Constitutional:      Appearance: Normal appearance. He is normal weight.  HENT:     Head: Normocephalic and atraumatic.     Nose: Nose normal.     Mouth/Throat:     Mouth: Mucous membranes are moist.  Eyes:     Extraocular Movements: Extraocular movements intact.     Pupils: Pupils  are equal, round, and reactive to light.  Cardiovascular:     Rate and Rhythm: Normal rate and regular rhythm.  Pulmonary:     Effort: Pulmonary effort is normal.     Breath sounds: Normal breath sounds.  Abdominal:     General: There is no distension.     Palpations: Abdomen is soft.     Tenderness: There is no abdominal tenderness.     Hernia: A hernia is present. Hernia is present in the left inguinal area. There is no hernia in the right inguinal area.     Comments: Reducible left inguinal hernia, no obvious right inguinal hernia on exam, tender on left   Musculoskeletal:        General: No swelling. Normal range of motion.     Cervical back: Normal range of motion. No rigidity.  Skin:    General: Skin is warm and dry.  Neurological:     General: No focal deficit present.     Mental Status: He is alert and oriented to person, place, and time.  Psychiatric:        Mood and Affect: Mood normal.        Behavior: Behavior normal.        Thought Content: Thought content normal.        Judgment: Judgment normal.     Results: None   Assessment & Plan:  Kobie R Marron is a 69 y.o. male with a left inguinal hernia that has worsened in the last 2 weeks with some activity. He is having a bulge/ knot and discomfort in the area. He reports that this has continued to cause him problems and he is having to limit his activity.  He has no cardiac history and did have a stress test in 2017 after a pulled muscle in his chest he reports. This was all normal with no evidence of CAD. He denies any chest pain or SOB.   -Left inguinal hernia repair with mesh   Discussed the risk and benefits including, bleeding, infection, use of mesh, risk of recurrence, risk of nerve damage causing numbness or changes in sensation, risk of damage to the cord structures. The patient understands the risk and benefits of repair with mesh, and has decided to proceed.  We also discussed open versus laparoscopic  surgery and the use of mesh. We discussed that I do open repairs with mesh, and that this is considered   equivalent to laparoscopic surgery. We discussed reasons for opting for laparoscopic surgery including if a bilateral repair is needed or if a patient has a recurrence after an open repair.  We discussed COVID testing preoperatively. We discussed the post operative course and need to not lift anything heavy for about 6-8 weeks after surgery.   We discussed that he could develop a hernia on the right and this may need surgery in the future. We also discussed the option of a hernia belt if he wanted to avoid surgery. He wants to proceed with surgery at this time.   All questions were answered to the satisfaction of the patient.   Timathy Newberry C Mellissa Conley 03/14/2020, 9:26 AM     

## 2020-03-14 NOTE — Patient Instructions (Signed)
Inguinal Hernia, Adult An inguinal hernia is when fat or your intestines push through a weak spot in a muscle where your leg meets your lower belly (groin). This causes a rounded lump (bulge). This kind of hernia could also be:  In your scrotum, if you are male.  In folds of skin around your vagina, if you are male. There are three types of inguinal hernias. These include:  Hernias that can be pushed back into the belly (are reducible). This type rarely causes pain.  Hernias that cannot be pushed back into the belly (are incarcerated).  Hernias that cannot be pushed back into the belly and lose their blood supply (are strangulated). This type needs emergency surgery. If you do not have symptoms, you may not need treatment. If you have symptoms or a large hernia, you may need surgery. Follow these instructions at home: Lifestyle  Do these things if told by your doctor so you do not have trouble pooping (constipation): ? Drink enough fluid to keep your pee (urine) pale yellow. ? Eat foods that have a lot of fiber. These include fresh fruits and vegetables, whole grains, and beans. ? Limit foods that are high in fat and processed sugars. These include foods that are fried or sweet. ? Take medicine for trouble pooping.  Avoid lifting heavy objects.  Avoid standing for long amounts of time.  Do not use any products that contain nicotine or tobacco. These include cigarettes and e-cigarettes. If you need help quitting, ask your doctor.  Stay at a healthy weight. General instructions  You may try to push your hernia in by very gently pressing on it when you are lying down. Do not try to force the bulge back in if it will not push in easily.  Watch your hernia for any changes in shape, size, or color. Tell your doctor if you see any changes.  Take over-the-counter and prescription medicines only as told by your doctor.  Keep all follow-up visits as told by your doctor. This is  important. Contact a doctor if:  You have a fever.  You have new symptoms.  Your symptoms get worse. Get help right away if:  The area where your leg meets your lower belly has: ? Pain that gets worse suddenly. ? A bulge that gets bigger suddenly, and it does not get smaller after that. ? A bulge that turns red or purple. ? A bulge that is painful when you touch it.  You are a man, and your scrotum: ? Suddenly feels painful. ? Suddenly changes in size.  You cannot push the hernia in by very gently pressing on it when you are lying down. Do not try to force the bulge back in if it will not push in easily.  You feel sick to your stomach (nauseous), and that feeling does not go away.  You throw up (vomit), and that keeps happening.  You have a fast heartbeat.  You cannot poop (have a bowel movement) or pass gas. These symptoms may be an emergency. Do not wait to see if the symptoms will go away. Get medical help right away. Call your local emergency services (911 in the [place] Summary  An inguinal hernia is when fat or your intestines push through a weak spot in a muscle where your leg meets your lower belly (groin). This causes a rounded lump (bulge).  If you do not have symptoms, you may not need treatment. If you have symptoms or a large hernia, you   may need surgery.  Avoid lifting heavy objects. Also avoid standing for long amounts of time.  Do not try to force the bulge back in if it will not push in easily. This information is not intended to replace advice given to you by your health care provider. Make sure you discuss any questions you have with your health care provider. Document Revised: 01/15/2018 Document Reviewed: 09/15/2017 Elsevier Patient Education  2020 [employer]    Open Hernia Repair, Adult  Open hernia repair is a surgical procedure to fix a hernia. A hernia occurs when an internal organ or tissue pushes out through a weak spot in the abdominal  wall muscles. Hernias commonly occur in the groin and around the navel. Most hernias tend to get worse over time. Often, surgery is done to prevent the hernia from becoming bigger, uncomfortable, or an emergency. Emergency surgery may be needed if abdominal contents get stuck in the opening (incarcerated hernia) or the blood supply gets cut off (strangulated hernia). In an open repair, an incision is made in the abdomen to perform the surgery. Tell a health care provider about:  Any allergies you have.  All medicines you are taking, including vitamins, herbs, eye drops, creams, and over-the-counter medicines.  Any problems you or family members have had with anesthetic medicines.  Any blood or bone disorders you have.  Any surgeries you have had.  Any medical conditions you have, including any recent cold or flu symptoms.  Whether you are pregnant or may be pregnant. What are the risks? Generally, this is a safe procedure. However, problems may occur, including:  Long-lasting (chronic) pain.  Bleeding.  Infection.  Damage to the testicle. This can cause shrinking or swelling.  Damage to the bladder, blood vessels, intestine, or nerves near the hernia.  Trouble passing urine.  Allergic reactions to medicines.  Return of the hernia. Medicines  Ask your health care provider about: ? Changing or stopping your regular medicines. This is especially important if you are taking diabetes medicines or blood thinners. ? Taking medicines such as aspirin and ibuprofen. These medicines can thin your blood. Do not take these medicines before your procedure if your health care provider instructs you not to.  You may be given antibiotic medicine to help prevent infection. General instructions  You may have blood tests or imaging studies.  Ask your health care provider how your surgical site will be marked or identified.  If you smoke, do not smoke for at least 2 weeks before your  procedure or for as long as told by your health care provider.  Let your health care provider know if you develop a cold or any infection before your surgery.  Plan to have someone take you home from the hospital or clinic.  If you will be going home right after the procedure, plan to have someone with you for 24 hours. What happens during the procedure?  To reduce your risk of infection: ? Your health care team will wash or sanitize their hands. ? Your skin will be washed with soap. ? Hair may be removed from the surgical area.  An IV tube will be inserted into one of your veins.  You will be given one or more of the following: ? A medicine to help you relax (sedative). ? A medicine to numb the area (local anesthetic). ? A medicine to make you fall asleep (general anesthetic).  Your surgeon will make an incision over the hernia.  The tissues of   the hernia will be moved back into place.  The edges of the hernia may be stitched together.  The opening in the abdominal muscles will be closed with stitches (sutures). Or, your surgeon will place a mesh patch made of manmade (synthetic) material over the opening.  The incision will be closed.  A bandage (dressing) may be placed over the incision. The procedure may vary among health care providers and hospitals. What happens after the procedure?  Your blood pressure, heart rate, breathing rate, and blood oxygen level will be monitored until the medicines you were given have worn off.  You may be given medicine for pain.  Do not drive for 24 hours if you received a sedative. This information is not intended to replace advice given to you by your health care provider. Make sure you discuss any questions you have with your health care provider. Document Revised: 11/26/2017 Document Reviewed: 05/27/2016 Elsevier Patient Education  2020 [employer] Inc.  

## 2020-03-15 ENCOUNTER — Encounter: Payer: Self-pay | Admitting: General Surgery

## 2020-03-15 NOTE — H&P (Signed)
[facility] History and Physical  Reason for Referral: Left inguinal hernia  Referring Physician:  Darla Lesches FNP   Chief Complaint    New Patient (Initial Visit)      Peter Mathis is a 69 y.o. male.  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 The pop was in the groin area on the left and ever since that time he has felt some discomfort and bulge.  He says the bulge does go in but gets worse again when he is working or on his feet. He says that some arthritis cream "Blue EMU" cream has helped soothe the area some.  He says that he has had no nausea or vomiting or obstructive type symptoms. His past PCP, Dr. Laurance Flatten had told him at one point he had a hernia and may need to get this repaired, but he has had no issues with the area until the past 2 weeks.  He describes the pain as stabbing and pulling at times especially with stretching out.  He has limited is work some due to this discomfort and concern.  He is active and wants to stay active.  Right now he has completed his work on the peach trees and feels like he has time to have a hernia repair.   He says that he may have felt a small knot on the right previously but that this area has not been hurting.   Past Medical History:  Diagnosis Date  . Chest discomfort 1989  . Diverticulosis   . DM type 2 (diabetes mellitus, type 2) ([place])   . Heart murmur   . Hyperlipidemia     Past Surgical History:  Procedure Laterality Date  . TONSILLECTOMY AND ADENOIDECTOMY  01/1955    Family History  Problem Relation Age of Onset  . Diabetes Mother   . Heart attack Father   . Colon cancer Neg Hx     Social History   Tobacco Use  . Smoking status: Never Smoker  . Smokeless tobacco: Never Used  Substance Use Topics  . Alcohol use: No  . Drug use: No    Medications: I have reviewed the patient's  current medications. Allergies as of 03/14/2020      Reactions   Penicillins Rash      Medication List       Accurate as of March 14, 2020  9:26 AM. If you have any questions, ask your nurse or doctor.        Aspirin Low Dose 81 MG EC tablet Generic drug: aspirin Take 81 mg by mouth daily.   atorvastatin 80 MG tablet Commonly known as: LIPITOR TAKE 1/2 TABLET BY MOUTH EVERY DAY AT BEDTIME   azelastine 0.1 % nasal spray Commonly known as: Astelin 2 sprays each nostril at bedtime   Fish Oil 1000 MG Caps Take 2,000 mg by mouth daily.   fluticasone 50 MCG/ACT nasal spray Commonly known as: FLONASE Place 2 sprays into both nostrils daily.   glucose blood test strip Commonly known as: ONE TOUCH ULTRA TEST Test bid, Dx E11.9   Januvia 100 MG tablet Generic drug: sitaGLIPtin TAKE 1 TABLET BY MOUTH EVERY DAY   lisinopril 5 MG tablet Commonly known as: ZESTRIL TAKE 1 TABLET BY MOUTH EVERY DAY   metFORMIN 500 MG tablet Commonly known as: GLUCOPHAGE TAKE 2 TABS IN THE  AM AND 1 TAB IN THE PM   OneTouch Verio w/Device Kit 1 Stick by Does not apply route 2 (two) times daily.   sildenafil 20 MG tablet Commonly known as: REVATIO Take 2-5 tabs po PRN prior to sexual activity.   Vitamin D3 125 MCG (5000 UT) Caps Take 1 capsule by mouth daily.        ROS:  A comprehensive review of systems was negative except for: Gastrointestinal: positive for left groin pain and associated abdominal pain  Blood pressure 138/75, pulse 70, temperature 98.1 F (36.7 C), temperature source Oral, resp. rate 12, height _0  (1.803 m), weight 171 lb (77.6 kg), SpO2 96 %. Physical Exam Vitals reviewed.  Constitutional:      Appearance: Normal appearance. He is normal weight.  HENT:     Head: Normocephalic and atraumatic.     Nose: Nose normal.     Mouth/Throat:     Mouth: Mucous membranes are moist.  Eyes:     Extraocular Movements: Extraocular movements intact.     Pupils: Pupils  are equal, round, and reactive to light.  Cardiovascular:     Rate and Rhythm: Normal rate and regular rhythm.  Pulmonary:     Effort: Pulmonary effort is normal.     Breath sounds: Normal breath sounds.  Abdominal:     General: There is no distension.     Palpations: Abdomen is soft.     Tenderness: There is no abdominal tenderness.     Hernia: A hernia is present. Hernia is present in the left inguinal area. There is no hernia in the right inguinal area.     Comments: Reducible left inguinal hernia, no obvious right inguinal hernia on exam, tender on left   Musculoskeletal:        General: No swelling. Normal range of motion.     Cervical back: Normal range of motion. No rigidity.  Skin:    General: Skin is warm and dry.  Neurological:     General: No focal deficit present.     Mental Status: He is alert and oriented to person, place, and time.  Psychiatric:        Mood and Affect: Mood normal.        Behavior: Behavior normal.        Thought Content: Thought content normal.        Judgment: Judgment normal.     Results: None   Assessment & Plan:  Peter Mathis is a 69 y.o. male with a left inguinal hernia that has worsened in the last 2 weeks with some activity. He is having a bulge/ knot and discomfort in the area. He reports that this has continued to cause him problems and he is having to limit his activity.  He has no cardiac history and did have a stress test in 2017 after a pulled muscle in his chest he reports. This was all normal with no evidence of CAD. He denies any chest pain or SOB.   -Left inguinal hernia repair with mesh   Discussed the risk and benefits including, bleeding, infection, use of mesh, risk of recurrence, risk of nerve damage causing numbness or changes in sensation, risk of damage to the cord structures. The patient understands the risk and benefits of repair with mesh, and has decided to proceed.  We also discussed open versus laparoscopic  surgery and the use of mesh. We discussed that I do open repairs with mesh, and that this is considered  equivalent to laparoscopic surgery. We discussed reasons for opting for laparoscopic surgery including if a bilateral repair is needed or if a patient has a recurrence after an open repair.  We discussed COVID testing preoperatively. We discussed the post operative course and need to not lift anything heavy for about 6-8 weeks after surgery.   We discussed that he could develop a hernia on the right and this may need surgery in the future. We also discussed the option of a hernia belt if he wanted to avoid surgery. He wants to proceed with surgery at this time.   All questions were answered to the satisfaction of the patient.   Virl Cagey 03/14/2020, 9:26 AM

## 2020-03-21 ENCOUNTER — Encounter: Payer: Self-pay | Admitting: *Deleted

## 2020-03-23 ENCOUNTER — Other Ambulatory Visit: Payer: Self-pay | Admitting: Family Medicine

## 2020-03-27 ENCOUNTER — Other Ambulatory Visit: Payer: Self-pay | Admitting: Nurse Practitioner

## 2020-03-30 ENCOUNTER — Other Ambulatory Visit: Payer: Self-pay | Admitting: Family Medicine

## 2020-03-30 DIAGNOSIS — J0100 Acute maxillary sinusitis, unspecified: Secondary | ICD-10-CM

## 2020-04-01 ENCOUNTER — Encounter: Payer: Self-pay | Admitting: General Surgery

## 2020-04-01 ENCOUNTER — Other Ambulatory Visit: Payer: Self-pay

## 2020-04-01 ENCOUNTER — Encounter (HOSPITAL_COMMUNITY)
Admission: RE | Admit: 2020-04-01 | Discharge: 2020-04-01 | Disposition: A | Discharge status: Home / Self Care | Payer: PPO | Source: Ambulatory Visit | Attending: General Surgery | Admitting: General Surgery

## 2020-04-01 ENCOUNTER — Encounter (HOSPITAL_COMMUNITY): Payer: Self-pay

## 2020-04-01 ENCOUNTER — Other Ambulatory Visit (HOSPITAL_COMMUNITY)
Admission: RE | Admit: 2020-04-01 | Discharge: 2020-04-01 | Disposition: A | Discharge status: Home / Self Care | Payer: PPO | Source: Ambulatory Visit | Attending: General Surgery | Admitting: General Surgery

## 2020-04-01 DIAGNOSIS — Z01812 Encounter for preprocedural laboratory examination: Secondary | ICD-10-CM | POA: Insufficient documentation

## 2020-04-01 DIAGNOSIS — I4510 Unspecified right bundle-branch block: Secondary | ICD-10-CM | POA: Insufficient documentation

## 2020-04-01 DIAGNOSIS — Z20822 Contact with and (suspected) exposure to covid-19: Secondary | ICD-10-CM | POA: Diagnosis not present

## 2020-04-01 DIAGNOSIS — E119 Type 2 diabetes mellitus without complications: Secondary | ICD-10-CM | POA: Diagnosis not present

## 2020-04-01 DIAGNOSIS — Z01810 Encounter for preprocedural cardiovascular examination: Secondary | ICD-10-CM | POA: Insufficient documentation

## 2020-04-01 HISTORY — DX: Personal history of urinary calculi: Z87.442

## 2020-04-01 LAB — BASIC METABOLIC PANEL
Anion gap: 7 (ref 5–15)
BUN: 12 mg/dL (ref 8–23)
CO2: 26 mmol/L (ref 22–32)
Calcium: 9.1 mg/dL (ref 8.9–10.3)
Chloride: 105 mmol/L (ref 98–111)
Creatinine, Ser: 0.9 mg/dL (ref 0.61–1.24)
GFR calc Af Amer: >60 mL/min (ref >60)
GFR calc non Af Amer: >60 mL/min (ref >60)
Glucose, Bld: 152 mg/dL — ABNORMAL HIGH (ref 70–99)
Potassium: 4.7 mmol/L (ref 3.5–5.1)
Sodium: 138 mmol/L (ref 135–145)

## 2020-04-01 LAB — CBC WITH DIFFERENTIAL/PLATELET
Abs Immature Granulocytes: 0.03 10*3/uL (ref 0.00–0.07)
Basophils Absolute: 0 10*3/uL (ref 0.0–0.1)
Basophils Relative: 0 %
Eosinophils Absolute: 0.1 10*3/uL (ref 0.0–0.5)
Eosinophils Relative: 1 %
HCT: 41.3 % (ref 39.0–52.0)
Hemoglobin: 13.7 g/dL (ref 13.0–17.0)
Immature Granulocytes: 0 %
Lymphocytes Relative: 26 %
Lymphs Abs: 2.3 10*3/uL (ref 0.7–4.0)
MCH: 29.7 pg (ref 26.0–34.0)
MCHC: 33.2 g/dL (ref 30.0–36.0)
MCV: 89.4 fL (ref 80.0–100.0)
Monocytes Absolute: 0.6 10*3/uL (ref 0.1–1.0)
Monocytes Relative: 7 %
Neutro Abs: 5.8 10*3/uL (ref 1.7–7.7)
Neutrophils Relative %: 66 %
Platelets: 232 10*3/uL (ref 150–400)
RBC: 4.62 MIL/uL (ref 4.22–5.81)
RDW: 12.8 % (ref 11.5–15.5)
WBC: 8.8 10*3/uL (ref 4.0–10.5)
nRBC: 0 % (ref 0.0–0.2)

## 2020-04-01 LAB — SARS CORONAVIRUS 2 (TAT 6-24 HRS): SARS Coronavirus 2: NEGATIVE

## 2020-04-01 LAB — GLUCOSE, CAPILLARY: Glucose-Capillary: 117 mg/dL — ABNORMAL HIGH (ref 70–99)

## 2020-04-01 LAB — HEMOGLOBIN A1C
Hgb A1c MFr Bld: 7.6 % — ABNORMAL HIGH (ref 4.8–5.6)
Mean Plasma Glucose: 171.42 mg/dL

## 2020-04-01 NOTE — Patient Instructions (Signed)
Peter Mathis  04/01/2020     @PREFPERIOPPHARMACY @   Your procedure is scheduled on  04/03/2020 .  Report to [facility] at  [place].M.  Call this number if you have problems the morning of surgery:  [phone]   Remember:  Do not eat or drink after midnight.                        Take these medicines the morning of surgery with A SIP OF WATER  Lisinopril.    Do not wear jewelry, make-up or nail polish.  Do not wear lotions, powders, or perfumes. Please wear deodorant and brush your teeth.  Do not shave 48 hours prior to surgery.  Men may shave face and neck.  Do not bring valuables to the hospital.  [facility] is not responsible for any belongings or valuables.  Contacts, dentures or bridgework may not be worn into surgery.  Leave your suitcase in the car.  After surgery it may be brought to your room.  For patients admitted to the hospital, discharge time will be determined by your treatment team.  Patients discharged the day of surgery will not be allowed to drive home.   Name and phone number of your driver:   family Special instructions:  DO NOT smoke the morning of your procedure.  Please read over the following fact sheets that you were given. Anesthesia Post-op Instructions and Care and Recovery After Surgery       Open Hernia Repair, Adult, Care After These instructions give you information about caring for yourself after your procedure. Your doctor may also give you more specific instructions. If you have problems or questions, contact your doctor. Follow these instructions at home: Surgical cut (incision) care   Follow instructions from your doctor about how to take care of your surgical cut area. Make sure you: ? Wash your hands with soap and water before you change your bandage (dressing). If you cannot use soap and water, use hand sanitizer. ? Change your bandage as told by your doctor. ? Leave stitches (sutures), skin glue, or skin tape  (adhesive) strips in place. They may need to stay in place for 2 weeks or longer. If tape strips get loose and curl up, you may trim the loose edges. Do not remove tape strips completely unless your doctor says it is okay.  Check your surgical cut every day for signs of infection. Check for: ? More redness, swelling, or pain. ? More fluid or blood. ? Warmth. ? Pus or a bad smell. Activity  Do not drive or use heavy machinery while taking prescription pain medicine. Do not drive until your doctor says it is okay.  Until your doctor says it is okay: ? Do not lift anything that is heavier than 10 lb (4.5 kg). ? Do not play contact sports.  Return to your normal activities as told by your doctor. Ask your doctor what activities are safe. General instructions  To prevent or treat having a hard time pooping (constipation) while you are taking prescription pain medicine, your doctor may recommend that you: ? Drink enough fluid to keep your pee (urine) clear or pale yellow. ? Take over-the-counter or prescription medicines. ? Eat foods that are high in fiber, such as fresh fruits and vegetables, whole grains, and beans. ? Limit foods that are high in fat and processed sugars, such as fried and sweet  foods.  Take over-the-counter and prescription medicines only as told by your doctor.  Do not take baths, swim, or use a hot tub until your doctor says it is okay.  Keep all follow-up visits as told by your doctor. This is important. Contact a doctor if:  You develop a rash.  You have more redness, swelling, or pain around your surgical cut.  You have more fluid or blood coming from your surgical cut.  Your surgical cut feels warm to the touch.  You have pus or a bad smell coming from your surgical cut.  You have a fever or chills.  You have blood in your poop (stool).  You have not pooped in 2-3 days.  Medicine does not help your pain. Get help right away if:  You have chest  pain or you are short of breath.  You feel light-headed.  You feel weak and dizzy (feel faint).  You have very bad pain.  You throw up (vomit) and your pain is worse. This information is not intended to replace advice given to you by your health care provider. Make sure you discuss any questions you have with your health care provider. Document Revised: 04/07/2019 Document Reviewed: 05/27/2016 Elsevier Patient Education  2020 [place] Anesthesia, Adult, Care After This sheet gives you information about how to care for yourself after your procedure. Your health care provider may also give you more specific instructions. If you have problems or questions, contact your health care provider. What can I expect after the procedure? After the procedure, the following side effects are common:  Pain or discomfort at the IV site.  Nausea.  Vomiting.  Sore throat.  Trouble concentrating.  Feeling cold or chills.  Weak or tired.  Sleepiness and fatigue.  Soreness and body aches. These side effects can affect parts of the body that were not involved in surgery. Follow these instructions at home:  For at least 24 hours after the procedure:  Have a responsible adult stay with you. It is important to have someone help care for you until you are awake and alert.  Rest as needed.  Do not: ? Participate in activities in which you could fall or become injured. ? Drive. ? Use heavy machinery. ? Drink alcohol. ? Take sleeping pills or medicines that cause drowsiness. ? Make important decisions or sign legal documents. ? Take care of children on your own. Eating and drinking  Follow any instructions from your health care provider about eating or drinking restrictions.  When you feel hungry, start by eating small amounts of foods that are soft and easy to digest (bland), such as toast. Gradually return to your regular diet.  Drink enough fluid to keep your urine pale  yellow.  If you vomit, rehydrate by drinking water, juice, or clear broth. General instructions  If you have sleep apnea, surgery and certain medicines can increase your risk for breathing problems. Follow instructions from your health care provider about wearing your sleep device: ? Anytime you are sleeping, including during daytime naps. ? While taking prescription pain medicines, sleeping medicines, or medicines that make you drowsy.  Return to your normal activities as told by your health care provider. Ask your health care provider what activities are safe for you.  Take over-the-counter and prescription medicines only as told by your health care provider.  If you smoke, do not smoke without supervision.  Keep all follow-up visits as told by your health care provider. This is  important. Contact a health care provider if:  You have nausea or vomiting that does not get better with medicine.  You cannot eat or drink without vomiting.  You have pain that does not get better with medicine.  You are unable to pass urine.  You develop a skin rash.  You have a fever.  You have redness around your IV site that gets worse. Get help right away if:  You have difficulty breathing.  You have chest pain.  You have blood in your urine or stool, or you vomit blood. Summary  After the procedure, it is common to have a sore throat or nausea. It is also common to feel tired.  Have a responsible adult stay with you for the first 24 hours after general anesthesia. It is important to have someone help care for you until you are awake and alert.  When you feel hungry, start by eating small amounts of foods that are soft and easy to digest (bland), such as toast. Gradually return to your regular diet.  Drink enough fluid to keep your urine pale yellow.  Return to your normal activities as told by your health care provider. Ask your health care provider what activities are safe for you.  This information is not intended to replace advice given to you by your health care provider. Make sure you discuss any questions you have with your health care provider. Document Revised: 12/17/2017 Document Reviewed: 07/30/2017 Elsevier Patient Education  [place]. How to Use Chlorhexidine for Bathing Chlorhexidine gluconate (CHG) is a germ-killing (antiseptic) solution that is used to clean the skin. It can get rid of the bacteria that normally live on the skin and can keep them away for about 24 hours. To clean your skin with CHG, you may be given:  A CHG solution to use in the shower or as part of a sponge bath.  A prepackaged cloth that contains CHG. Cleaning your skin with CHG may help lower the risk for infection:  While you are staying in the intensive care unit of the hospital.  If you have a vascular access, such as a central line, to provide short-term or long-term access to your veins.  If you have a catheter to drain urine from your bladder.  If you are on a ventilator. A ventilator is a machine that helps you breathe by moving air in and out of your lungs.  After surgery. What are the risks? Risks of using CHG include:  A skin reaction.  Hearing loss, if CHG gets in your ears.  Eye injury, if CHG gets in your eyes and is not rinsed out.  The CHG product catching fire. Make sure that you avoid smoking and flames after applying CHG to your skin. Do not use CHG:  If you have a chlorhexidine allergy or have previously reacted to chlorhexidine.  On babies younger than 32 months of age. How to use CHG solution  Use CHG only as told by your health care provider, and follow the instructions on the label.  Use the full amount of CHG as directed. Usually, this is one bottle. During a shower Follow these steps when using CHG solution during a shower (unless your health care provider gives you different instructions): 1. Start the shower. 2. Use your normal  soap and shampoo to wash your face and hair. 3. Turn off the shower or move out of the shower stream. 4. Pour the CHG onto a clean washcloth. Do not use  any type of brush or rough-edged sponge. 5. Starting at your neck, lather your body down to your toes. Make sure you follow these instructions: ? If you will be having surgery, pay special attention to the part of your body where you will be having surgery. Scrub this area for at least 1 minute. ? Do not use CHG on your head or face. If the solution gets into your ears or eyes, rinse them well with water. ? Avoid your genital area. ? Avoid any areas of skin that have broken skin, cuts, or scrapes. ? Scrub your back and under your arms. Make sure to wash skin folds. 6. Let the lather sit on your skin for 1-2 minutes or as long as told by your health care provider. 7. Thoroughly rinse your entire body in the shower. Make sure that all body creases and crevices are rinsed well. 8. Dry off with a clean towel. Do not put any substances on your body afterward-such as powder, lotion, or perfume-unless you are told to do so by your health care provider. Only use lotions that are recommended by the manufacturer. 9. Put on clean clothes or pajamas. 10. If it is the night before your surgery, sleep in clean sheets.  During a sponge bath Follow these steps when using CHG solution during a sponge bath (unless your health care provider gives you different instructions): 1. Use your normal soap and shampoo to wash your face and hair. 2. Pour the CHG onto a clean washcloth. 3. Starting at your neck, lather your body down to your toes. Make sure you follow these instructions: ? If you will be having surgery, pay special attention to the part of your body where you will be having surgery. Scrub this area for at least 1 minute. ? Do not use CHG on your head or face. If the solution gets into your ears or eyes, rinse them well with water. ? Avoid your genital  area. ? Avoid any areas of skin that have broken skin, cuts, or scrapes. ? Scrub your back and under your arms. Make sure to wash skin folds. 4. Let the lather sit on your skin for 1-2 minutes or as long as told by your health care provider. 5. Using a different clean, wet washcloth, thoroughly rinse your entire body. Make sure that all body creases and crevices are rinsed well. 6. Dry off with a clean towel. Do not put any substances on your body afterward-such as powder, lotion, or perfume-unless you are told to do so by your health care provider. Only use lotions that are recommended by the manufacturer. 7. Put on clean clothes or pajamas. 8. If it is the night before your surgery, sleep in clean sheets. How to use CHG prepackaged cloths  Only use CHG cloths as told by your health care provider, and follow the instructions on the label.  Use the CHG cloth on clean, dry skin.  Do not use the CHG cloth on your head or face unless your health care provider tells you to.  When washing with the CHG cloth: ? Avoid your genital area. ? Avoid any areas of skin that have broken skin, cuts, or scrapes. Before surgery Follow these steps when using a CHG cloth to clean before surgery (unless your health care provider gives you different instructions): 1. Using the CHG cloth, vigorously scrub the part of your body where you will be having surgery. Scrub using a back-and-forth motion for 3 minutes. The area on  your body should be completely wet with CHG when you are done scrubbing. 2. Do not rinse. Discard the cloth and let the area air-dry. Do not put any substances on the area afterward, such as powder, lotion, or perfume. 3. Put on clean clothes or pajamas. 4. If it is the night before your surgery, sleep in clean sheets.  For general bathing Follow these steps when using CHG cloths for general bathing (unless your health care provider gives you different instructions). 1. Use a separate CHG  cloth for each area of your body. Make sure you wash between any folds of skin and between your fingers and toes. Wash your body in the following order, switching to a new cloth after each step: ? The front of your neck, shoulders, and chest. ? Both of your arms, under your arms, and your hands. ? Your stomach and groin area, avoiding the genitals. ? Your right leg and foot. ? Your left leg and foot. ? The back of your neck, your back, and your buttocks. 2. Do not rinse. Discard the cloth and let the area air-dry. Do not put any substances on your body afterward-such as powder, lotion, or perfume-unless you are told to do so by your health care provider. Only use lotions that are recommended by the manufacturer. 3. Put on clean clothes or pajamas. Contact a health care provider if:  Your skin gets irritated after scrubbing.  You have questions about using your solution or cloth. Get help right away if:  Your eyes become very red or swollen.  Your eyes itch badly.  Your skin itches badly and is red or swollen.  Your hearing changes.  You have trouble seeing.  You have swelling or tingling in your mouth or throat.  You have trouble breathing.  You swallow any chlorhexidine. Summary  Chlorhexidine gluconate (CHG) is a germ-killing (antiseptic) solution that is used to clean the skin. Cleaning your skin with CHG may help to lower your risk for infection.  You may be given CHG to use for bathing. It may be in a bottle or in a prepackaged cloth to use on your skin. Carefully follow your health care provider's instructions and the instructions on the product label.  Do not use CHG if you have a chlorhexidine allergy.  Contact your health care provider if your skin gets irritated after scrubbing. This information is not intended to replace advice given to you by your health care provider. Make sure you discuss any questions you have with your health care provider. Document Revised:  03/02/2019 Document Reviewed: 11/11/2017 Elsevier Patient Education  [place].

## 2020-04-02 ENCOUNTER — Telehealth: Payer: Self-pay | Admitting: General Surgery

## 2020-04-02 MED ORDER — ONDANSETRON 4 MG PO TBDP: 4.0000 mg | ORAL_TABLET | Freq: Three times a day (TID) | ORAL | 0 refills | Status: DC | PRN

## 2020-04-02 MED ORDER — DOCUSATE SODIUM 100 MG PO CAPS: 100.0000 mg | ORAL_CAPSULE | Freq: Two times a day (BID) | ORAL | 0 refills | Status: AC

## 2020-04-02 MED ORDER — OXYCODONE HCL 5 MG PO TABS: 5.0000 mg | ORAL_TABLET | ORAL | 0 refills | Status: DC | PRN

## 2020-04-02 NOTE — Telephone Encounter (Signed)
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  Surgery tomorrow. Rx sent in today. Roxicodone 5mg  q4 PRN (#25), Colace 100 mg BID, Zofran 4 ODT PRN   Curlene Labrum, MD [facility] [address] [place], [place] [address] [phone] (office)

## 2020-04-03 ENCOUNTER — Ambulatory Visit (HOSPITAL_COMMUNITY)
Admission: RE | Admit: 2020-04-03 | Discharge: 2020-04-03 | Disposition: A | Discharge status: Home / Self Care | Payer: PPO | Attending: General Surgery | Admitting: General Surgery

## 2020-04-03 ENCOUNTER — Ambulatory Visit (HOSPITAL_COMMUNITY): Payer: PPO | Admitting: Anesthesiology

## 2020-04-03 ENCOUNTER — Encounter (HOSPITAL_COMMUNITY)
Admission: RE | Disposition: A | Discharge status: Home / Self Care | Payer: Self-pay | Source: Home / Self Care | Attending: General Surgery

## 2020-04-03 ENCOUNTER — Encounter (HOSPITAL_COMMUNITY): Payer: Self-pay | Admitting: General Surgery

## 2020-04-03 DIAGNOSIS — D176 Benign lipomatous neoplasm of spermatic cord: Secondary | ICD-10-CM | POA: Diagnosis not present

## 2020-04-03 DIAGNOSIS — E785 Hyperlipidemia, unspecified: Secondary | ICD-10-CM | POA: Diagnosis not present

## 2020-04-03 DIAGNOSIS — E119 Type 2 diabetes mellitus without complications: Secondary | ICD-10-CM | POA: Insufficient documentation

## 2020-04-03 DIAGNOSIS — K4090 Unilateral inguinal hernia, without obstruction or gangrene, not specified as recurrent: Secondary | ICD-10-CM

## 2020-04-03 DIAGNOSIS — Z7982 Long term (current) use of aspirin: Secondary | ICD-10-CM | POA: Diagnosis not present

## 2020-04-03 DIAGNOSIS — Z7984 Long term (current) use of oral hypoglycemic drugs: Secondary | ICD-10-CM | POA: Insufficient documentation

## 2020-04-03 DIAGNOSIS — Z79899 Other long term (current) drug therapy: Secondary | ICD-10-CM | POA: Insufficient documentation

## 2020-04-03 HISTORY — PX: INGUINAL HERNIA REPAIR: SHX194

## 2020-04-03 LAB — GLUCOSE, CAPILLARY
Glucose-Capillary: 169 mg/dL — ABNORMAL HIGH (ref 70–99)
Glucose-Capillary: 160 mg/dL — ABNORMAL HIGH (ref 70–99)

## 2020-04-03 SURGERY — REPAIR, HERNIA, INGUINAL, ADULT
Anesthesia: General | Site: Inguinal | Laterality: Left

## 2020-04-03 MED ORDER — SODIUM CHLORIDE 0.9 % IR SOLN
Status: DC | PRN
  Administered 2020-04-03: 1000 mL

## 2020-04-03 MED ORDER — DEXAMETHASONE SODIUM PHOSPHATE 10 MG/ML IJ SOLN
INTRAMUSCULAR | Status: AC
  Filled 2020-04-03: qty 1

## 2020-04-03 MED ORDER — HYDROMORPHONE HCL 1 MG/ML IJ SOLN
INTRAMUSCULAR | Status: AC
  Filled 2020-04-03: qty 0.5

## 2020-04-03 MED ORDER — PROPOFOL 10 MG/ML IV BOLUS
INTRAVENOUS | Status: DC | PRN
  Administered 2020-04-03: 150 mg via INTRAVENOUS

## 2020-04-03 MED ORDER — LIDOCAINE HCL (CARDIAC) PF 100 MG/5ML IV SOSY
PREFILLED_SYRINGE | INTRAVENOUS | Status: DC | PRN
  Administered 2020-04-03: 60 mg via INTRAVENOUS

## 2020-04-03 MED ORDER — ONDANSETRON HCL 4 MG/2ML IJ SOLN
INTRAMUSCULAR | Status: DC | PRN
  Administered 2020-04-03: 4 mg via INTRAVENOUS

## 2020-04-03 MED ORDER — PROMETHAZINE HCL 25 MG/ML IJ SOLN: 6.2500 mg | INTRAMUSCULAR | Status: DC | PRN

## 2020-04-03 MED ORDER — FENTANYL CITRATE (PF) 100 MCG/2ML IJ SOLN
INTRAMUSCULAR | Status: AC
  Filled 2020-04-03: qty 2

## 2020-04-03 MED ORDER — LACTATED RINGERS IV SOLN: Freq: Once | INTRAVENOUS | Status: AC

## 2020-04-03 MED ORDER — CHLORHEXIDINE GLUCONATE CLOTH 2 % EX PADS: 6.0000 | MEDICATED_PAD | Freq: Once | CUTANEOUS | Status: DC

## 2020-04-03 MED ORDER — MIDAZOLAM HCL 2 MG/2ML IJ SOLN
INTRAMUSCULAR | Status: AC
  Filled 2020-04-03: qty 2

## 2020-04-03 MED ORDER — CLINDAMYCIN PHOSPHATE 900 MG/50ML IV SOLN
900.0000 mg | INTRAVENOUS | Status: AC
  Administered 2020-04-03: 10:00:00 900 mg via INTRAVENOUS

## 2020-04-03 MED ORDER — ONDANSETRON HCL 4 MG/2ML IJ SOLN
INTRAMUSCULAR | Status: AC
  Filled 2020-04-03: qty 2

## 2020-04-03 MED ORDER — MIDAZOLAM HCL 5 MG/5ML IJ SOLN
INTRAMUSCULAR | Status: DC | PRN
  Administered 2020-04-03: 2 mg via INTRAVENOUS

## 2020-04-03 MED ORDER — LIDOCAINE 2% (20 MG/ML) 5 ML SYRINGE
INTRAMUSCULAR | Status: AC
  Filled 2020-04-03: qty 5

## 2020-04-03 MED ORDER — BUPIVACAINE LIPOSOME 1.3 % IJ SUSP
INTRAMUSCULAR | Status: AC
  Filled 2020-04-03: qty 20

## 2020-04-03 MED ORDER — MEPERIDINE HCL 50 MG/ML IJ SOLN: 6.2500 mg | INTRAMUSCULAR | Status: DC | PRN

## 2020-04-03 MED ORDER — EPHEDRINE SULFATE 50 MG/ML IJ SOLN
INTRAMUSCULAR | Status: DC | PRN
  Administered 2020-04-03 (×2): 10 mg via INTRAVENOUS

## 2020-04-03 MED ORDER — LACTATED RINGERS IV SOLN: INTRAVENOUS | Status: DC | PRN

## 2020-04-03 MED ORDER — PROPOFOL 10 MG/ML IV BOLUS
INTRAVENOUS | Status: AC
  Filled 2020-04-03: qty 20

## 2020-04-03 MED ORDER — DEXAMETHASONE SODIUM PHOSPHATE 10 MG/ML IJ SOLN
INTRAMUSCULAR | Status: DC | PRN
  Administered 2020-04-03: 4 mg via INTRAVENOUS

## 2020-04-03 MED ORDER — FENTANYL CITRATE (PF) 100 MCG/2ML IJ SOLN
INTRAMUSCULAR | Status: DC | PRN
  Administered 2020-04-03 (×2): 50 ug via INTRAVENOUS

## 2020-04-03 MED ORDER — KETOROLAC TROMETHAMINE 30 MG/ML IJ SOLN
30.0000 mg | Freq: Once | INTRAMUSCULAR | Status: AC
  Administered 2020-04-03: 30 mg via INTRAVENOUS
  Filled 2020-04-03: qty 1

## 2020-04-03 MED ORDER — BUPIVACAINE LIPOSOME 1.3 % IJ SUSP
INTRAMUSCULAR | Status: DC | PRN
  Administered 2020-04-03: 20 mL

## 2020-04-03 MED ORDER — CLINDAMYCIN PHOSPHATE 900 MG/50ML IV SOLN
INTRAVENOUS | Status: AC
  Filled 2020-04-03: qty 50

## 2020-04-03 MED ORDER — HYDROMORPHONE HCL 1 MG/ML IJ SOLN
0.2500 mg | INTRAMUSCULAR | Status: DC | PRN
  Administered 2020-04-03: 0.5 mg via INTRAVENOUS

## 2020-04-03 SURGICAL SUPPLY — 42 items
ADH SKN CLS APL DERMABOND .7 (GAUZE/BANDAGES/DRESSINGS) ×1
CLOTH BEACON ORANGE TIMEOUT ST (SAFETY) ×3 IMPLANT
COVER LIGHT HANDLE STERIS (MISCELLANEOUS) ×6 IMPLANT
COVER WAND RF STERILE (DRAPES) ×3 IMPLANT
DERMABOND ADVANCED (GAUZE/BANDAGES/DRESSINGS) ×2
DERMABOND ADVANCED .7 DNX12 (GAUZE/BANDAGES/DRESSINGS) ×1 IMPLANT
DRAIN PENROSE 0.5X18 (DRAIN) ×3 IMPLANT
ELECT REM PT RETURN 9FT ADLT (ELECTROSURGICAL) ×3
ELECTRODE REM PT RTRN 9FT ADLT (ELECTROSURGICAL) ×1 IMPLANT
GAUZE SPONGE 4X4 12PLY STRL (GAUZE/BANDAGES/DRESSINGS) ×3 IMPLANT
GLOVE BIO SURGEON STRL SZ 6.5 (GLOVE) ×2 IMPLANT
GLOVE BIO SURGEONS STRL SZ 6.5 (GLOVE) ×1
GLOVE BIOGEL PI IND STRL 6.5 (GLOVE) ×1 IMPLANT
GLOVE BIOGEL PI IND STRL 7.0 (GLOVE) ×3 IMPLANT
GLOVE BIOGEL PI INDICATOR 6.5 (GLOVE) ×2
GLOVE BIOGEL PI INDICATOR 7.0 (GLOVE) ×6
GLOVE ECLIPSE 6.5 STRL STRAW (GLOVE) ×4 IMPLANT
GOWN STRL REUS W/TWL LRG LVL3 (GOWN DISPOSABLE) ×9 IMPLANT
INST SET MINOR GENERAL (KITS) ×3 IMPLANT
KIT TURNOVER KIT A (KITS) ×3 IMPLANT
MANIFOLD NEPTUNE II (INSTRUMENTS) ×3 IMPLANT
MESH HERNIA 1.6X1.9 PLUG LRG (Mesh General) IMPLANT
MESH HERNIA PLUG LRG (Mesh General) ×4 IMPLANT
NDL HYPO 18GX1.5 BLUNT FILL (NEEDLE) ×1 IMPLANT
NEEDLE HYPO 18GX1.5 BLUNT FILL (NEEDLE) ×3 IMPLANT
NEEDLE HYPO 22GX1.5 SAFETY (NEEDLE) ×3 IMPLANT
NS IRRIG 1000ML POUR BTL (IV SOLUTION) ×3 IMPLANT
PACK MINOR (CUSTOM PROCEDURE TRAY) ×3 IMPLANT
PAD ARMBOARD 7.5X6 YLW CONV (MISCELLANEOUS) ×3 IMPLANT
SET BASIN LINEN APH (SET/KITS/TRAYS/PACK) ×3 IMPLANT
SOL PREP PROV IODINE SCRUB 4OZ (MISCELLANEOUS) ×2 IMPLANT
SUT MNCRL AB 4-0 PS2 18 (SUTURE) ×3 IMPLANT
SUT NOVA NAB GS-22 2 2-0 T-19 (SUTURE) ×8 IMPLANT
SUT SILK 3 0 (SUTURE) ×3
SUT SILK 3-0 18XBRD TIE 12 (SUTURE) IMPLANT
SUT VIC AB 2-0 CT1 27 (SUTURE) ×3
SUT VIC AB 2-0 CT1 TAPERPNT 27 (SUTURE) ×1 IMPLANT
SUT VIC AB 3-0 SH 27 (SUTURE) ×6
SUT VIC AB 3-0 SH 27X BRD (SUTURE) ×1 IMPLANT
SUT VICRYL AB 3 0 TIES (SUTURE) IMPLANT
SYR 20ML LL LF (SYRINGE) ×3 IMPLANT
SYR 30ML LL (SYRINGE) ×3 IMPLANT

## 2020-04-03 NOTE — Anesthesia Preprocedure Evaluation (Signed)
Anesthesia Evaluation  Patient identified by MRN, date of birth, ID band Patient awake    Reviewed: Allergy & Precautions, NPO status , Patient's Chart, lab work & pertinent test results  Airway Mallampati: II  TM Distance: >3 FB Neck ROM: Full    Dental  (+) Missing, Dental Advisory Given   Pulmonary neg pulmonary ROS,    Pulmonary exam normal breath sounds clear to auscultation       Cardiovascular Exercise Tolerance: Good Normal cardiovascular exam(-) dysrhythmias + Valvular Problems/Murmurs  Rhythm:Regular Rate:Normal - Systolic murmurs, - Diastolic murmurs, - Friction Rub, - Carotid Bruit, - Peripheral Edema and - Systolic Click [id number]  [facility]-OPS ROUTINE RECORD Sinus rhythm with 1st degree A-V block Incomplete right bundle branch block Left anterior fascicular block Anterolateral infarct , age undetermined Cannot rule out Inferior infarct , age undetermined Abnormal ECG No old tracing to compare Confirmed by Glori Bickers [phone]) on 04/01/2020 9:39:27 PM 48mm/s 51mm/mV 100Hz  9.0.4 12SL 243 CID: [id number] Referred by: Darla Lesches Confirmed By: Glori Bickers   Neuro/Psych negative neurological ROS  negative psych ROS   GI/Hepatic negative GI ROS, Neg liver ROS,   Endo/Other  diabetes, Well Controlled, Type 2, Oral Hypoglycemic Agents  Renal/GU negative Renal ROS  negative genitourinary   Musculoskeletal negative musculoskeletal ROS (+)   Abdominal   Peds negative pediatric ROS (+)  Hematology negative hematology ROS (+)   Anesthesia Other Findings Stress test - 2017 Response to Stress -  There was no ST segment deviation noted during stress.  Arrhythmias during stress:  rare PVCs.   Arrhythmias during recovery:  rare rare, PACsrare PVCs.     Arrhythmias were not significant.   ECG was interpretable and there was no significant change from baseline.    Reproductive/Obstetrics negative OB ROS                            Anesthesia Physical Anesthesia Plan  ASA: II  Anesthesia Plan: General   Post-op Pain Management:    Induction: Intravenous  PONV Risk Score and Plan: 3 and Ondansetron, Dexamethasone and Midazolam  Airway Management Planned: LMA  Additional Equipment:   Intra-op Plan:   Post-operative Plan: Extubation in OR  Informed Consent: I have reviewed the patients History and Physical, chart, labs and discussed the procedure including the risks, benefits and alternatives for the proposed anesthesia with the patient or authorized representative who has indicated his/her understanding and acceptance.     Dental advisory given  Plan Discussed with: CRNA and Surgeon  Anesthesia Plan Comments:        Anesthesia Quick Evaluation

## 2020-04-03 NOTE — Op Note (Addendum)
[facility] Operative Note  04/03/20  Preoperative Diagnosis: Left inguinal hernia    Postoperative Diagnosis: Left direct inguinal hernia    Procedure(s) Performed: Left direct inguinal hernia repair with mesh   Surgeon: Peter Matar. 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  He says that the bulge gets worse when he was working or on his feet and he had pain in the area.  We discussed the risk and benefits of a open inguinal hernia repair including but not limited to bleeding, infection, recurrence, use of mesh, injury to the testicle, cord structures or nerves, and he opted to proceed.   Findings:Direct inguinal hernia, laxity of floor    Procedure: The patient was taken to the operating room and placed supine. General endotracheal anesthesia was induced. Intravenous antibiotics were administered per protocol.  A time out was preformed verifying the correct patient, procedure, site, positioning and implants.  The left groin and scrotum were prepared and draped in the usual sterile fashion.   An incision was made in a natural skin crease between the pubic tubercle and the anterior superior iliac spine.  The incision was deepened with electrocautery through Scarpa's and Camper's fascia until the aponeurosis of the external oblique was encountered.  This was cleaned and the external ring was exposed.  An incision was made in the midportion of the external oblique aponeurosis in the direction of its fibers. The ilioinguinal  nerve was identified and was protected throughout the dissection.  Flaps of the external oblique were developed cephalad and inferiorly.    The cord was identified and it was gently dissented free at the pubic tubercle and encircled with a Penrose drain.  There was an obvious bulge medial to the epigastric vessels, and this corresponded to a defect in the floor and a very lax floor.  There was a direct inguinal hernia. Attention was then directed at the anteromedial aspect of the cord, where a small cord lipoma was identified and ligated with a 3-0 silk suture, but no hernia sac was found. indirect hernia sac was identified. Again, I looked to ensure no hernia sac with the cord structures, and protected the vas and testicular vessels. I felt into the internal ring and felt no defect or opening into the peritoneal cavity. Given this there was no indirect hernia and only a direct hernia.  Two Large Perfix Plugs were placed into the defect of the direct hernia and in the lax floor to fill the space. The plugs were secured to each other and the floor with 3-0 Vicryl suture to ensure they did no shift.  The Perfix Mesh Patch was sutured to the inguinal ligament inferiorly starting at the pubic tubercle using 2-0 Novafil interrupted sutures.  The mesh was sutured superiorly to the conjoint tendon using 2-0 Novafil interrupted sutures.  Care was taken to ensure the mesh was placed in a relaxed fashion to avoid excessive tension and no neurovascular structures were caught in the repair.  Laterally  the tails of the mesh were crossed and the internal ring was recreated, allowing for passage of cords without tension.   Hemostasis was adequate.  The Penrose was removed.  The external oblique aponeurosis was closed with a 2-0 Vicryl suture in a running fashion, taking care to not catch the ilioinguinal nerve in the suture line.  Scarpa's fashion was closed with 3-0 Vicryl interrupted sutures. The skin was closed with a  subcuticular 4-0 Monocryl suture.  Dermabond was applied.   The testis was gently pulled down into its anatomic position in the scrotum.  The patient tolerated the procedure well and was taken to the PACU in stable condition. All counts were correct at the end of the case.        Curlene Labrum, MD [facility] [address] [place], [place] [address] [phone] (office)

## 2020-04-03 NOTE — Anesthesia Postprocedure Evaluation (Signed)
Anesthesia Post Note  Patient: Peter Mathis  Procedure(s) Performed: HERNIA REPAIR INGUINAL ADULT WITH MESH (Left Groin)  Patient location during evaluation: PACU Anesthesia Type: General Level of consciousness: awake, oriented, awake and alert and patient cooperative Pain management: pain level controlled Vital Signs Assessment: post-procedure vital signs reviewed and stable Respiratory status: spontaneous breathing, respiratory function stable and nonlabored ventilation Cardiovascular status: stable Postop Assessment: no apparent nausea or vomiting Anesthetic complications: no     Last Vitals:  Vitals:   04/03/20 0934  BP: (!) 141/67  Pulse: 70  Resp: 15  Temp: (!) 36.3 C  SpO2: 97%    Last Pain:  Vitals:   04/03/20 0934  TempSrc: Oral  PainSc: 4                  Saxon Barich

## 2020-04-03 NOTE — Progress Notes (Signed)
[facility] and spoke with Dawn. Rx already sent in for patient. Plan to see 5/4 for follow up.  Curlene Labrum, MD [facility] [address] [place], [place] [address] [phone] (office)

## 2020-04-03 NOTE — Interval H&P Note (Signed)
History and Physical Interval Note:  04/03/2020 10:24 AM  Peter Mathis  has presented today for surgery, with the diagnosis of Left inguinal hernia.  The various methods of treatment have been discussed with the patient and family. After consideration of risks, benefits and other options for treatment, the patient has consented to  Procedure(s): HERNIA REPAIR INGUINAL ADULT WITH MESH (Left) as a surgical intervention.  The patient's history has been reviewed, patient examined, no change in status, stable for surgery.  I have reviewed the patient's chart and labs.  Questions were answered to the patient's satisfaction.    No changes. Marked.  Virl Cagey

## 2020-04-03 NOTE — Transfer of Care (Signed)
Immediate Anesthesia Transfer of Care Note  Patient: Peter Mathis  Procedure(s) Performed: HERNIA REPAIR INGUINAL ADULT WITH MESH (Left Groin)  Patient Location: PACU  Anesthesia Type:General  Level of Consciousness: awake, alert , oriented and patient cooperative  Airway & Oxygen Therapy: Patient Spontanous Breathing and Patient connected to face mask oxygen  Post-op Assessment: Report given to RN and Post -op Vital signs reviewed and stable  Post vital signs: Reviewed and stable  Last Vitals:  Vitals Value Taken Time  BP    Temp    Pulse    Resp 10 04/03/20 1158  SpO2    Vitals shown include unvalidated device data.  Last Pain:  Vitals:   04/03/20 0934  TempSrc: Oral  PainSc: 4       Patients Stated Pain Goal: 8 ([id number])  Complications: No apparent anesthesia complications

## 2020-04-03 NOTE — Discharge Instructions (Signed)
Discharge Instructions Hernia:  Common Complaints: Pain at the incision site is common. This will improve with time. Take your pain medications as described below. Some nausea is common and poor appetite. The main goal is to stay hydrated the first few days after surgery.  Numbness at the incision or the thigh is common.  Use ice pack and keep your scrotum elevated with a towel under it to help with swelling.  If you start to have burning or tingling pain in your groin, this is from a nerve being pinched. Please call and we can prescribe you a different type of pain medication for nerve pain.   Diet/ Activity: Diet as tolerated. You may not have an appetite, but it is important to stay hydrated. Drink 64 ounces of water a day. Your appetite will return with time.  Shower per your regular routine daily.  Do not take hot showers. Take warm showers that are less than 10 minutes. Rest and listen to your body, but do not remain in bed all day.  Walk everyday for at least 15-20 minutes. Deep cough and move around every 1-2 hours in the first few days after surgery.  Do not pick at the dermabond glue on your incision sites.  This glue film will remain in place for 1-2 weeks and will start to peel off.  Do not place lotions or balms on your incision unless instructed to specifically by Dr. Constance Haw.  Do not lift > 10 lbs, perform excessive bending, pushing, pulling, squatting for 6-8 weeks after surgery.   Pain Expectations and Narcotics: -After surgery you will have pain associated with your incisions and this is normal. The pain is muscular and nerve pain, and will get better with time. -You are encouraged and expected to take non narcotic medications like tylenol and ibuprofen (when able) to treat pain as multiple modalities can aid with pain treatment. -Narcotics are only used when pain is severe or there is breakthrough pain. -You are not expected to have a pain score of 0 after surgery, as we  cannot prevent pain. A pain score of 3-4 that allows you to be functional, move, walk, and tolerate some activity is the goal. The pain will continue to improve over the days after surgery and is dependent on your surgery. -Due to [place] law, we are only able to give a certain amount of pain medication to treat post operative pain, and we only give additional narcotics on a patient by patient basis.  -For most laparoscopic surgery, studies have shown that the majority of patients only need 10-15 narcotic pills, and for open surgeries most patients only need 15-20.   -Having appropriate expectations of pain and knowledge of pain management with non narcotics is important as we do not want anyone to become addicted to narcotic pain medication.  -Using ice packs in the first 48 hours and heating pads after 48 hours, wearing an abdominal binder (when recommended), and using over the counter medications are all ways to help with pain management.   -Simple acts like meditation and mindfulness practices after surgery can also help with pain control and research has proven the benefit of these practices.  Medication: Take tylenol and ibuprofen as needed for pain control, alternating every 4-6 hours.  Example:  Tylenol 1000mg  @ 6am, 12noon, 6pm, 61midnight (Do not exceed 4000mg  of tylenol a day). Ibuprofen 800mg  @ 9am, 3pm, 9pm, 3am (Do not exceed 3600mg  of ibuprofen a day).  Take Roxicodone for breakthrough pain every  4 hours.  Take Colace for constipation related to narcotic pain medication. If you do not have a bowel movement in 2 days, take Miralax over the counter.  Drink plenty of water to also prevent constipation.   Contact Information: If you have questions or concerns, please call our office, [phone], Monday- Thursday 8AM-5PM and Friday 8AM-12Noon.  If it is after hours or on the weekend, please call [facility] Main [facility], [phone], and ask to speak to the surgeon on call for Dr. Constance Haw at [facility].    Open Hernia Repair, Adult, Care After These instructions give you information about caring for yourself after your procedure. Your doctor may also give you more specific instructions. If you have problems or questions, contact your doctor. Follow these instructions at home: Surgical cut (incision) care   Follow instructions from your doctor about how to take care of your surgical cut area. Make sure you: ? Wash your hands with soap and water before you change your bandage (dressing). If you cannot use soap and water, use hand sanitizer. ? Change your bandage as told by your doctor. ? Leave stitches (sutures), skin glue, or skin tape (adhesive) strips in place. They may need to stay in place for 2 weeks or longer. If tape strips get loose and curl up, you may trim the loose edges. Do not remove tape strips completely unless your doctor says it is okay.  Check your surgical cut every day for signs of infection. Check for: ? More redness, swelling, or pain. ? More fluid or blood. ? Warmth. ? Pus or a bad smell. Activity  Do not drive or use heavy machinery while taking prescription pain medicine. Do not drive until your doctor says it is okay.  Until your doctor says it is okay: ? Do not lift anything that is heavier than 10 lb (4.5 kg). ? Do not play contact sports.  Return to your normal activities as told by your doctor. Ask your doctor what activities are safe. General instructions  To prevent or treat having a hard time pooping (constipation) while you are taking prescription pain medicine, your doctor may recommend that you: ? Drink enough fluid to keep your pee (urine) clear or pale yellow. ? Take over-the-counter or prescription medicines. ? Eat foods that are high in fiber, such as fresh fruits and vegetables, whole grains, and beans. ? Limit foods that are high in fat and processed sugars, such as fried and sweet foods.  Take over-the-counter and prescription  medicines only as told by your doctor.  Do not take baths, swim, or use a hot tub until your doctor says it is okay.  Keep all follow-up visits as told by your doctor. This is important. Contact a doctor if:  You develop a rash.  You have more redness, swelling, or pain around your surgical cut.  You have more fluid or blood coming from your surgical cut.  Your surgical cut feels warm to the touch.  You have pus or a bad smell coming from your surgical cut.  You have a fever or chills.  You have blood in your poop (stool).  You have not pooped in 2-3 days.  Medicine does not help your pain. Get help right away if:  You have chest pain or you are short of breath.  You feel light-headed.  You feel weak and dizzy (feel faint).  You have very bad pain.  You throw up (vomit) and your pain is worse. This information is  not intended to replace advice given to you by your health care provider. Make sure you discuss any questions you have with your health care provider. Document Revised: 04/07/2019 Document Reviewed: 05/27/2016 Elsevier Patient Education  [place].

## 2020-04-03 NOTE — Anesthesia Procedure Notes (Signed)
Procedure Name: LMA Insertion Date/Time: 04/03/2020 10:35 AM Performed by: Jonna Munro, CRNA Pre-anesthesia Checklist: Patient identified, Emergency Drugs available, Suction available, Patient being monitored and Timeout performed Patient Re-evaluated:Patient Re-evaluated prior to induction Oxygen Delivery Method: Circle system utilized Preoxygenation: Pre-oxygenation with 100% oxygen Induction Type: IV induction LMA: LMA inserted LMA Size: 5.0 Number of attempts: 1 Tube secured with: Tape Dental Injury: Teeth and Oropharynx as per pre-operative assessment

## 2020-04-08 ENCOUNTER — Telehealth: Payer: Self-pay | Admitting: Family Medicine

## 2020-04-08 ENCOUNTER — Telehealth: Payer: Self-pay

## 2020-04-08 NOTE — Telephone Encounter (Signed)
When I called back, patient's wife said she had contacted his surgeon and they recommended he discontinue the Oxycodone he was prescribed after his surgery.  I told her if this did not help they should go to the emergency room.

## 2020-04-08 NOTE — Telephone Encounter (Signed)
Patient had Hernia surgery-04/03/20 Inguinal hernia- patient states he has had a migraine headache since Friday- nothing he takes seems to help . He has taken caffeine, ibuprofen. He is taking oxycodone- wonders if the narcotic could be the cause. He is taking tylenol and ibuprofen and has decided to stop the narcotic. He denies fever, chills or nausea. Excision site looks good. Bowels are moving normal. They also have a call in to speak with his PCP.

## 2020-04-09 ENCOUNTER — Telehealth: Payer: Self-pay

## 2020-04-09 ENCOUNTER — Encounter: Payer: Self-pay | Admitting: General Surgery

## 2020-04-09 NOTE — Telephone Encounter (Signed)
Patient wife states her husband is continuing to have the migraine headaches.  He stopped the oxycodone and took tylenol only. He was instructed to try taking 2 ibuprofen and calling back in an hour. I let her know that we could try Tramadol but it could cause headache as well. I let her know Dr.Bridges will be in clinic today and to call back in an hour if no better.

## 2020-04-10 ENCOUNTER — Telehealth: Payer: Self-pay | Admitting: Family Medicine

## 2020-04-10 ENCOUNTER — Ambulatory Visit: Payer: PPO | Admitting: Family Medicine

## 2020-04-10 NOTE — Telephone Encounter (Signed)
This could be a number of things pertaining to the eye. He needs to be evaluated.

## 2020-04-10 NOTE — Telephone Encounter (Signed)
Patient refused to go anywhere else appointment made for visit tomorrow. He was very upset stated that this office was sad that he could not get in today to be evaluated. Was advised again he needed his eye evaluated ASAP patient declined.

## 2020-04-10 NOTE — Telephone Encounter (Signed)
Patient c/o right eye to open up does have some eye drainage clear. With headache behind that eye. Denies itching it is facial tenderness. Eye sight is fine. Refuses to go anywhere even eye doctor states he know is a sinus infection. Please advise

## 2020-04-11 ENCOUNTER — Other Ambulatory Visit: Payer: Self-pay

## 2020-04-11 ENCOUNTER — Emergency Department (HOSPITAL_COMMUNITY)
Admission: EM | Admit: 2020-04-11 | Discharge: 2020-04-11 | Disposition: A | Discharge status: Home / Self Care | Payer: PPO | Attending: Emergency Medicine | Admitting: Emergency Medicine

## 2020-04-11 ENCOUNTER — Ambulatory Visit: Payer: PPO | Admitting: Family Medicine

## 2020-04-11 ENCOUNTER — Emergency Department (HOSPITAL_COMMUNITY): Payer: PPO

## 2020-04-11 ENCOUNTER — Encounter (HOSPITAL_COMMUNITY): Payer: Self-pay | Admitting: *Deleted

## 2020-04-11 DIAGNOSIS — H532 Diplopia: Secondary | ICD-10-CM | POA: Diagnosis not present

## 2020-04-11 DIAGNOSIS — H4901 Third [oculomotor] nerve palsy, right eye: Secondary | ICD-10-CM | POA: Diagnosis not present

## 2020-04-11 DIAGNOSIS — R29818 Other symptoms and signs involving the nervous system: Secondary | ICD-10-CM | POA: Diagnosis not present

## 2020-04-11 DIAGNOSIS — H02401 Unspecified ptosis of right eyelid: Secondary | ICD-10-CM | POA: Diagnosis present

## 2020-04-11 DIAGNOSIS — E119 Type 2 diabetes mellitus without complications: Secondary | ICD-10-CM | POA: Diagnosis not present

## 2020-04-11 LAB — COMPREHENSIVE METABOLIC PANEL
ALT: 21 U/L (ref 0–44)
AST: 16 U/L (ref 15–41)
Albumin: 3.6 g/dL (ref 3.5–5.0)
Alkaline Phosphatase: 44 U/L (ref 38–126)
Anion gap: 9 (ref 5–15)
BUN: 15 mg/dL (ref 8–23)
CO2: 24 mmol/L (ref 22–32)
Calcium: 8.8 mg/dL — ABNORMAL LOW (ref 8.9–10.3)
Chloride: 99 mmol/L (ref 98–111)
Creatinine, Ser: 0.7 mg/dL (ref 0.61–1.24)
GFR calc Af Amer: >60 mL/min (ref >60)
GFR calc non Af Amer: >60 mL/min (ref >60)
Glucose, Bld: 156 mg/dL — ABNORMAL HIGH (ref 70–99)
Potassium: 4.2 mmol/L (ref 3.5–5.1)
Sodium: 132 mmol/L — ABNORMAL LOW (ref 135–145)
Total Bilirubin: 0.5 mg/dL (ref 0.3–1.2)
Total Protein: 6.7 g/dL (ref 6.5–8.1)

## 2020-04-11 LAB — CBC WITH DIFFERENTIAL/PLATELET
Abs Immature Granulocytes: 0.03 10*3/uL (ref 0.00–0.07)
Basophils Absolute: 0 10*3/uL (ref 0.0–0.1)
Basophils Relative: 0 %
Eosinophils Absolute: 0.1 10*3/uL (ref 0.0–0.5)
Eosinophils Relative: 1 %
HCT: 42.5 % (ref 39.0–52.0)
Hemoglobin: 14.1 g/dL (ref 13.0–17.0)
Immature Granulocytes: 0 %
Lymphocytes Relative: 30 %
Lymphs Abs: 2.2 10*3/uL (ref 0.7–4.0)
MCH: 29.1 pg (ref 26.0–34.0)
MCHC: 33.2 g/dL (ref 30.0–36.0)
MCV: 87.8 fL (ref 80.0–100.0)
Monocytes Absolute: 0.6 10*3/uL (ref 0.1–1.0)
Monocytes Relative: 9 %
Neutro Abs: 4.4 10*3/uL (ref 1.7–7.7)
Neutrophils Relative %: 60 %
Platelets: 249 10*3/uL (ref 150–400)
RBC: 4.84 MIL/uL (ref 4.22–5.81)
RDW: 12.8 % (ref 11.5–15.5)
WBC: 7.3 10*3/uL (ref 4.0–10.5)
nRBC: 0 % (ref 0.0–0.2)

## 2020-04-11 MED ORDER — TETRACAINE HCL 0.5 % OP SOLN
2.0000 [drp] | Freq: Once | OPHTHALMIC | Status: AC
  Administered 2020-04-11: 2 [drp] via OPHTHALMIC
  Filled 2020-04-11: qty 4

## 2020-04-11 MED ORDER — KETOROLAC TROMETHAMINE 30 MG/ML IJ SOLN
30.0000 mg | Freq: Once | INTRAMUSCULAR | Status: AC
  Administered 2020-04-11: 30 mg via INTRAVENOUS
  Filled 2020-04-11: qty 1

## 2020-04-11 NOTE — ED Provider Notes (Signed)
The patient was assumed at the change of shift, pending MRI for his 3rd nerve palsy.  MRI was negative for acute process as above the patient will be discharged home with outpatient neurology follow-up.  The patient was given some anticipatory guidance.   Truddie Hidden, MD 04/11/20 1723

## 2020-04-11 NOTE — ED Provider Notes (Signed)
Emergency Department Provider Note   I have reviewed the triage vital signs and the nursing notes.   HISTORY  Chief Complaint Eye Problem   HPI Peter Mathis is a 68 y.o. male with PMH of DM, HLD, and recent hernia repair with Dr. Constance Haw 04/03/20 presents the emergency department for evaluation of right eyelid drooping with double vision.  Symptoms began approximately 1 week ago.  He has developed some pain in the area of the right eye and has been taking Tylenol for this.  He notes some double vision when he holds his right eye open but not.  He denies any headaches or fevers.  He has not had complications since his hernia surgery last week.  He is having bowel movements.  No numbness or unilateral weakness noted by the patient.  No prior history of similar symptoms.   Past Medical History:  Diagnosis Date  . Chest discomfort 1989  . Diverticulosis   . DM type 2 (diabetes mellitus, type 2) ([place])   . Heart murmur   . History of kidney stones   . Hyperlipidemia     Patient Active Problem List   Diagnosis Date Noted  . Left inguinal hernia 03/14/2020  . Diabetic retinopathy ([place]) 01/19/2020  . Vitamin D deficiency 10/04/2019  . Erectile dysfunction due to type 2 diabetes mellitus ([place]) 10/04/2019  . 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 Seborrheic keratosis 01/12/2017  . Erectile dysfunction due to arterial insufficiency 01/23/2016  . Right bundle branch block, history of 01/17/2014  . Hyperlipidemia associated with type 2 diabetes mellitus ([place]) 07/12/2013  . Type II diabetes mellitus ([place]) 07/12/2013  . Benign prostatic hyperplasia without lower urinary tract symptoms 07/12/2013    Past Surgical History:  Procedure Laterality Date  . GANGLION CYST EXCISION Right   . INGUINAL HERNIA REPAIR Left 04/03/2020   Procedure: HERNIA REPAIR INGUINAL ADULT WITH MESH;  Surgeon: Virl Cagey, MD;  Location: [facility];  Service: General;  Laterality: Left;  .  TONSILLECTOMY AND ADENOIDECTOMY  01/1955    Allergies Penicillins  Family History  Problem Relation Age of Onset  . Diabetes Mother   . Heart attack Father   . Colon cancer Neg Hx     Social History Social History   Tobacco Use  . Smoking status: Never Smoker  . Smokeless tobacco: Never Used  Substance Use Topics  . Alcohol use: No  . Drug use: No    Review of Systems  Constitutional: No fever/chills Eyes: Positive double vision and right eye droop.  ENT: No sore throat. Cardiovascular: Denies chest pain. Respiratory: Denies shortness of breath. Gastrointestinal: No abdominal pain.  No nausea, no vomiting.  No diarrhea.  No constipation. Genitourinary: Negative for dysuria. Musculoskeletal: Negative for back pain. Skin: Negative for rash. Neurological: Negative for focal weakness or numbness. Positive HA.   10-point ROS otherwise negative.  ____________________________________________   PHYSICAL EXAM:  VITAL SIGNS: ED Triage Vitals  Enc Vitals Group     BP 04/11/20 1010 (!) 197/91     Pulse Rate 04/11/20 1010 66     Resp 04/11/20 1010 18     Temp 04/11/20 1010 97.7 F (36.5 C)     Temp Source 04/11/20 1010 Oral     SpO2 04/11/20 1010 100 %     Weight 04/11/20 1007 171 lb (77.6 kg)     Height 04/11/20 1007 5\' 11"  (1.803 m)   Constitutional: Alert and oriented. Well appearing and in no acute distress.  Eyes: Conjunctivae are normal. PERRL.  3rd nerve palsy in the right eye with inability to adduct.  Patient with right-sided ptosis noted.  Head: Atraumatic. Nose: No congestion/rhinnorhea. Mouth/Throat: Mucous membranes are moist.  Neck: No stridor.  Cardiovascular: Normal rate, regular rhythm. Good peripheral circulation. Grossly normal heart sounds.   Respiratory: Normal respiratory effort.  No retractions. Lungs CTAB. Gastrointestinal: Soft and nontender. No distention.  Musculoskeletal: No lower extremity tenderness nor edema. No gross deformities of  extremities. Neurologic:  Normal speech and language.  3rd nerve palsy as noted above along with ptosis on the right.  No other cranial nerve deficits 2 through 12.  Equal strength and sensation in the bilateral upper and lower extremities. Skin:  Skin is warm, dry and intact. No rash noted.  ____________________________________________   LABS (all labs ordered are listed, but only abnormal results are displayed)  Labs Reviewed  COMPREHENSIVE METABOLIC PANEL - Abnormal; Notable for the following components:      Result Value   Sodium 132 (*)    Glucose, Bld 156 (*)    Calcium 8.8 (*)    All other components within normal limits  CBC WITH DIFFERENTIAL/PLATELET   ____________________________________________  EKG  None ____________________________________________  RADIOLOGY  MR ANGIO HEAD WO CONTRAST  Result Date: 04/11/2020 CLINICAL DATA:  Focal neuro deficit. Rule out stroke. Right eye drooping. Blurred vision. EXAM: MRI HEAD WITHOUT CONTRAST MRA HEAD WITHOUT CONTRAST TECHNIQUE: Multiplanar, multiecho pulse sequences of the brain and surrounding structures were obtained without intravenous contrast. Angiographic images of the head were obtained using MRA technique without contrast. COMPARISON:  None. FINDINGS: MRI HEAD FINDINGS Brain: No acute infarction, hemorrhage, hydrocephalus, extra-axial collection or mass lesion. Normal white matter and brainstem. Vascular: Normal arterial flow voids Skull and upper cervical spine: Negative Sinuses/Orbits: Mild mucosal edema paranasal sinuses. Negative orbit Other: None MRA HEAD FINDINGS Both vertebral arteries patent to the basilar. Right PICA patent. Left PICA not visualized. Left AICA patent. Basilar widely patent. Superior cerebellar and posterior cerebral arteries patent bilaterally without stenosis. Internal carotid artery normal bilaterally. Anterior and middle cerebral arteries normal bilaterally. IMPRESSION: Negative MRI head Negative MRA  head Electronically Signed   By: Franchot Gallo M.D.   On: 04/11/2020 16:40   MR BRAIN WO CONTRAST  Result Date: 04/11/2020 CLINICAL DATA:  Focal neuro deficit. Rule out stroke. Right eye drooping. Blurred vision. EXAM: MRI HEAD WITHOUT CONTRAST MRA HEAD WITHOUT CONTRAST TECHNIQUE: Multiplanar, multiecho pulse sequences of the brain and surrounding structures were obtained without intravenous contrast. Angiographic images of the head were obtained using MRA technique without contrast. COMPARISON:  None. FINDINGS: MRI HEAD FINDINGS Brain: No acute infarction, hemorrhage, hydrocephalus, extra-axial collection or mass lesion. Normal white matter and brainstem. Vascular: Normal arterial flow voids Skull and upper cervical spine: Negative Sinuses/Orbits: Mild mucosal edema paranasal sinuses. Negative orbit Other: None MRA HEAD FINDINGS Both vertebral arteries patent to the basilar. Right PICA patent. Left PICA not visualized. Left AICA patent. Basilar widely patent. Superior cerebellar and posterior cerebral arteries patent bilaterally without stenosis. Internal carotid artery normal bilaterally. Anterior and middle cerebral arteries normal bilaterally. IMPRESSION: Negative MRI head Negative MRA head Electronically Signed   By: Franchot Gallo M.D.   On: 04/11/2020 16:40    ____________________________________________   PROCEDURES  Procedure(s) performed:   Procedures  None ____________________________________________   INITIAL IMPRESSION / ASSESSMENT AND PLAN / ED COURSE  Pertinent labs & imaging results that were available during my care of the patient were reviewed by me  and considered in my medical decision making (see chart for details).   Patient presents emergency department for evaluation of drooping right eyelid.  He has a notable ptosis there along with an oculomotor palsy (3rd nerve) which is near complete. Exam not consistent with Horner's syndrome. No other neuro deficits on exam.  Patient seen by tele-neurology who evaluated and recommends MRI brain and MRA head.   Labs reviewed with no acute findings. Care transferred to Dr. Karle Starch pending MRI reads. If negative, patient to f/u with Neurology as an outpatient suspecting DM as the underlying cause.     ____________________________________________  FINAL CLINICAL IMPRESSION(S) / ED DIAGNOSES  Final diagnoses:  3rd nerve palsy, complete, right     MEDICATIONS GIVEN DURING THIS VISIT:  Medications  tetracaine (PONTOCAINE) 0.5 % ophthalmic solution 2 drop (2 drops Right Eye Given 04/11/20 1122)  ketorolac (TORADOL) 30 MG/ML injection 30 mg (30 mg Intravenous Given 04/11/20 1201)    Note:  This document was prepared using Dragon voice recognition software and may include unintentional dictation errors.  Nanda Quinton, MD, [facility]    Olamae Ferrara, [facility], MD 04/11/20 [phone]

## 2020-04-11 NOTE — ED Notes (Signed)
teleneuro now

## 2020-04-11 NOTE — Consult Note (Signed)
[facility] Tele[facility]urology Consult Services  Stat Consult  Date of Service:   04/11/2020 11:10:54  Impression:     .  H49.0 - Third [oculomotor] nerve palsy  Comments/Sign-Out: Noting that this progression was over days instead of minutes to hours this is most likely a diabetic third nerve palsy. Such cases have a fairly good chance of recovery over the course of about 12 weeks. If not he may benefit from prism lenses but the should not be added until at least 12 weeks is up because they may have to be replaced. Noting that the pupil is slightly larger than the left side and that he had headache, there is a low risk but not zero of aneurysm. Recommend that he have MRI the brain and MRA of the head. If these are negative he can be discharged but if positive further evaluation accordingly.  Metrics: [facility] Notification Time: 04/11/2020 11:09:26 Stamp Time: 04/11/2020 11:10:54 Callback Response Time: 04/11/2020 11:14:59  Our recommendations are outlined below.  Recommendations:     .  As above. Neurology follow-up on the outside if discharged.   Imaging Studies:     .  MRI Head     .  MRA Head Without Contrast  Disposition: Neurology Follow Up Recommended  ----------------------------------------------------------------------------------------------------  Chief Complaint: Double vision and right eyelid droop  History of Present Illness: Patient is a 69 year old Male.  This 69 year old man that one week ago shortly after hernia surgery developed drooping of his right eyelid in and over the course of several days worsening double vision. He two images are offset at an angle. He did have some right-sided head pain initially. He had no weakness or numbness of the extremities. He denies prior hypertension.    Past Medical History:     . Diabetes Mellitus     . Hyperlipidemia     . There is NO history of Hypertension     . There is NO history of Atrial  Fibrillation     . There is NO history of Coronary Artery Disease     . There is NO history of Stroke  Anticoagulant use:  No  Antiplatelet use: Aspirin     Examination: BP(197/91 ), Pulse(66), Blood Glucose(156 with A1c earlier this month 7.6) 1A: Level of Consciousness - Alert; keenly responsive + 0 1B: Ask Month and Age - Both Questions Right + 0 1C: Blink Eyes & Squeeze Hands - Performs Both Tasks + 0 2: Test Horizontal Extraocular Movements - Partial Gaze Palsy: Can Be Overcome + 1 3: Test Visual Fields - No Visual Loss + 0 4: Test Facial Palsy (Use Grimace if Obtunded) - Normal symmetry + 0 5A: Test Left Arm Motor Drift - No Drift for 10 Seconds + 0 5B: Test Right Arm Motor Drift - No Drift for 10 Seconds + 0 6A: Test Left Leg Motor Drift - No Drift for 5 Seconds + 0 6B: Test Right Leg Motor Drift - No Drift for 5 Seconds + 0 7: Test Limb Ataxia (FNF/Heel-Shin) - No Ataxia + 0 8: Test Sensation - Normal; No sensory loss + 0 9: Test Language/Aphasia - Normal; No aphasia + 0 10: Test Dysarthria - Normal + 0 11: Test Extinction/Inattention - No abnormality + 0  NIHSS Score: 1   Patient/Family was informed the Neurology Consult would occur via TeleHealth consult by way of interactive audio and video telecommunications and consented to receiving care in this manner.  Due to the immediate potential for life-threatening  deterioration due to underlying acute neurologic illness, I spent 23 minutes providing critical care. This time includes time for face to face visit via telemedicine, review of medical records, imaging studies and discussion of findings with providers, the patient and/or family.   Dr Cindie Laroche   [facility] [phone]  Case [id number]

## 2020-04-11 NOTE — ED Notes (Signed)
ED Provider at bedside. 

## 2020-04-11 NOTE — ED Triage Notes (Signed)
Pt c/o right eye drooping, pain and blurry vision that started last Thursday. Pt reports he had hernia repair last Wednesday. Tylenol last taken at 0300 this morning. Denies weakness on left side of body. Pt's smile is symmetrical, grips equal, pt can lift eyelid and sensation intact bilaterally. Pt reports sticky, clear drainage from right eye mainly at night, but sometimes during the day.

## 2020-04-12 ENCOUNTER — Telehealth: Payer: Self-pay | Admitting: Family Medicine

## 2020-04-12 ENCOUNTER — Encounter: Payer: Self-pay | Admitting: General Surgery

## 2020-04-12 NOTE — Telephone Encounter (Signed)
  REFERRAL REQUEST Telephone Note 04/12/2020  What type of referral do you need? Neurology  Have you been seen at our office for this problem? No pt went to [facility] ER yesterday and requested a referral (Advise that they may need an appointment with their PCP before a referral can be done)  Is there a particular doctor or location that you prefer? Dr Jackie Plum   Patient notified that referrals can take up to a week or longer to process. If they haven't heard anything within a week they should call back and speak with the referral department.

## 2020-04-15 ENCOUNTER — Telehealth: Payer: Self-pay | Admitting: Family Medicine

## 2020-04-15 NOTE — Telephone Encounter (Signed)
Patient aware.

## 2020-04-15 NOTE — Telephone Encounter (Signed)
Pt. Needs to be seen or have televisit for this. Thanks, WS

## 2020-04-15 NOTE — Telephone Encounter (Signed)
Appointment scheduled.

## 2020-04-15 NOTE — Telephone Encounter (Signed)
Patient is upset since he needs to be seen.  States that he called on 4/14 and was advised to go to the ER and on 4/15 he went to [facility].  Patient has last seen [name] 02/07/20 and has an est care visit with Dr. [name] on 6/9.  Tired to explain to patient why he needed to be seen but he would like for me to ask the provider again if they can just place the referral since we sent him to the ER. Please review and advise

## 2020-04-15 NOTE — Telephone Encounter (Signed)
lmtcb

## 2020-04-15 NOTE — Chronic Care Management (AMB) (Signed)
  Chronic Care Management   Note  04/15/2020 Name: Peter Mathis MRN: [id number] DOB: 05-08-1951  Peter Mathis is a 69 y.o. year old male who is a primary care patient of No primary care provider on file.. I reached out to Peter Mathis by phone today in response to a referral sent by [name]'s health plan.     Mr. Finigan was given information about Chronic Care Management services today including:  1. CCM service includes personalized support from designated clinical staff supervised by his physician, including individualized plan of care and coordination with other care providers 2. 24/7 contact phone numbers for assistance for urgent and routine care needs. 3. Service will only be billed when office clinical staff spend 20 minutes or more in a month to coordinate care. 4. Only one practitioner may furnish and bill the service in a calendar month. 5. The patient may stop CCM services at any time (effective at the end of the month) by phone call to the office staff. 6. The patient will be responsible for cost sharing (co-pay) of up to 20% of the service fee (after annual deductible is met).  Patient agreed to services and verbal consent obtained.   Follow up plan: Telephone appointment with care management team member scheduled for:06/04/2020.  [place], [place] [address] Direct Dial: [phone] [email or web address] Website: [email or web address]

## 2020-04-15 NOTE — Telephone Encounter (Signed)
I can not refer someone I have never seen AND who hasn't been seen in the office for the problem. WS

## 2020-04-17 ENCOUNTER — Ambulatory Visit (INDEPENDENT_AMBULATORY_CARE_PROVIDER_SITE_OTHER): Payer: PPO | Admitting: Family Medicine

## 2020-04-17 ENCOUNTER — Encounter: Payer: Self-pay | Admitting: Family Medicine

## 2020-04-17 DIAGNOSIS — H4901 Third [oculomotor] nerve palsy, right eye: Secondary | ICD-10-CM | POA: Diagnosis not present

## 2020-04-17 NOTE — Progress Notes (Signed)
    Subjective:    Patient ID: Peter Mathis, male    DOB: December 12, 1951, 69 y.o.   MRN: [id number]   HPI: Peter Mathis is a 69 y.o. male presenting for right eye vision doubled starting on 4/10. Swelling also noted.Pain was worse daily for several days. Had Work up in [place] that showed third nerve palsy.  Symptoms a little better now. Still feels a burning sensation. Having to wear a patch over the right eye.[name] Has ophthalmology appt. Set for 4/23. Dr. Eulas Post. Needs referral to neurology, Dr. Merlene Laughter. Fax number is [phone], phone [phone].   Depression screen [facility] 2/9 02/07/2020 10/04/2019 02/08/2019 12/06/2018 10/05/2018  Decreased Interest 0 0 0 0 0  Down, Depressed, Hopeless 0 0 0 0 0  PHQ - 2 Score 0 0 0 0 0  .   Relevant past medical, surgical, family and social history reviewed and updated as indicated.  Interim medical history since our last visit reviewed. Allergies and medications reviewed and updated.  ROS:  Review of Systems  Constitutional: Negative for fever.  Eyes: Positive for pain and visual disturbance (diplopia).  Skin: Negative for rash.     Social History   Tobacco Use  Smoking Status Never Smoker  Smokeless Tobacco Never Used       Objective:     Wt Readings from Last 3 Encounters:  04/11/20 171 lb (77.6 kg)  04/01/20 171 lb (77.6 kg)  03/14/20 171 lb (77.6 kg)     Exam deferred. Pt. Harboring due to COVID 19. Phone visit performed.   Assessment & Plan:   1. 3rd nerve palsy, complete, right     No orders of the defined types were placed in this encounter.   Orders Placed This Encounter  Procedures  . Ambulatory referral to Neurology    Referral Priority:   Routine    Referral Type:   Consultation    Referral Reason:   Specialty Services Required    Requested Specialty:   Neurology    Number of Visits Requested:   1      Diagnoses and all orders for this visit:  3rd nerve palsy, complete, right -     Ambulatory referral to  Neurology    Virtual Visit via telephone Note  I discussed the limitations, risks, security and privacy concerns of performing an evaluation and management service by telephone and the availability of in person appointments. The patient was identified with two identifiers. Pt.expressed understanding and agreed to proceed. Pt. Is at home. Dr. [name] is in his office.  Follow Up Instructions:   I discussed the assessment and treatment plan with the patient. The patient was provided an opportunity to ask questions and all were answered. The patient agreed with the plan and demonstrated an understanding of the instructions.   The patient was advised to call back or seek an in-person evaluation if the symptoms worsen or if the condition fails to improve as anticipated.   Total minutes including chart review and phone contact time: 17   Follow up plan: No follow-ups on file.  [name], MD [place]

## 2020-04-19 DIAGNOSIS — H532 Diplopia: Secondary | ICD-10-CM | POA: Diagnosis not present

## 2020-04-19 DIAGNOSIS — H2513 Age-related nuclear cataract, bilateral: Secondary | ICD-10-CM | POA: Diagnosis not present

## 2020-04-19 DIAGNOSIS — H02431 Paralytic ptosis of right eyelid: Secondary | ICD-10-CM | POA: Diagnosis not present

## 2020-04-19 DIAGNOSIS — H4901 Third [oculomotor] nerve palsy, right eye: Secondary | ICD-10-CM | POA: Diagnosis not present

## 2020-04-30 ENCOUNTER — Encounter: Payer: Self-pay | Admitting: General Surgery

## 2020-04-30 ENCOUNTER — Ambulatory Visit (INDEPENDENT_AMBULATORY_CARE_PROVIDER_SITE_OTHER): Payer: Self-pay | Admitting: General Surgery

## 2020-04-30 ENCOUNTER — Other Ambulatory Visit: Payer: Self-pay

## 2020-04-30 VITALS — BP 163/75 | HR 75 | Temp 98.0°F | Resp 12 | Ht 71.0 in | Wt 172.0 lb

## 2020-04-30 DIAGNOSIS — K4090 Unilateral inguinal hernia, without obstruction or gangrene, not specified as recurrent: Secondary | ICD-10-CM

## 2020-04-30 NOTE — Progress Notes (Signed)
[facility] Note   HPI:  69 y.o. Male presents to clinic for post-op follow-up evaluation of his left inguinal hernia repair. He also suffered from left eye third nerve palsy shortly after surgery and this is improving.   Review of Systems:  Minor discomfort with activity Improving vision All other review of systems: otherwise negative   Vital Signs:  BP (!) 163/75   Pulse 75   Temp 98 F (36.7 C) (Oral)   Resp 12   Ht 5\' 11"  (1.803 m)   Wt 172 lb (78 kg)   SpO2 95%   BMI 23.99 kg/m    Physical Exam:  Physical Exam Vitals reviewed.  Cardiovascular:     Rate and Rhythm: Normal rate.  Pulmonary:     Effort: Pulmonary effort is normal.  Abdominal:     General: There is no distension.     Palpations: Abdomen is soft.     Tenderness: There is no abdominal tenderness.     Hernia: No hernia is present.     Comments: Well healing left inguinal incision, minor induration, no erythema  Musculoskeletal:        General: No swelling.  Skin:    General: Skin is warm.  Neurological:     Mental Status: He is alert.      Assessment:  69 y.o. yo Male s/p left inguinal hernia repair with mesh. Doing well. Third eye palsy resolving.   Plan:  No heavy lifting > 10 lbs, excessive bending, pushing, pulling, or squatting for 6-8 weeks after surgery.   PRN follow up  All of the above recommendations were discussed with the patient, and all of patient's questions were answered to his expressed satisfaction.  Curlene Labrum, MD [facility] [address] [place], [place] [address] [phone] (office)

## 2020-04-30 NOTE — Patient Instructions (Signed)
No heavy lifting > 10 lbs, excessive bending, pushing, pulling, or squatting for 6-8 weeks after surgery.   

## 2020-05-06 ENCOUNTER — Other Ambulatory Visit: Payer: Self-pay | Admitting: *Deleted

## 2020-05-06 MED ORDER — METFORMIN HCL 500 MG PO TABS: ORAL_TABLET | ORAL | 1 refills | Status: DC

## 2020-05-07 DIAGNOSIS — H4901 Third [oculomotor] nerve palsy, right eye: Secondary | ICD-10-CM | POA: Diagnosis not present

## 2020-05-07 DIAGNOSIS — E785 Hyperlipidemia, unspecified: Secondary | ICD-10-CM | POA: Diagnosis not present

## 2020-05-07 DIAGNOSIS — E1142 Type 2 diabetes mellitus with diabetic polyneuropathy: Secondary | ICD-10-CM | POA: Diagnosis not present

## 2020-05-07 DIAGNOSIS — N529 Male erectile dysfunction, unspecified: Secondary | ICD-10-CM | POA: Insufficient documentation

## 2020-05-17 DIAGNOSIS — H2513 Age-related nuclear cataract, bilateral: Secondary | ICD-10-CM | POA: Diagnosis not present

## 2020-05-17 DIAGNOSIS — H4901 Third [oculomotor] nerve palsy, right eye: Secondary | ICD-10-CM | POA: Diagnosis not present

## 2020-05-17 DIAGNOSIS — H532 Diplopia: Secondary | ICD-10-CM | POA: Diagnosis not present

## 2020-05-17 DIAGNOSIS — H02431 Paralytic ptosis of right eyelid: Secondary | ICD-10-CM | POA: Diagnosis not present

## 2020-06-04 ENCOUNTER — Ambulatory Visit (INDEPENDENT_AMBULATORY_CARE_PROVIDER_SITE_OTHER): Payer: PPO | Admitting: Licensed Clinical Social Worker

## 2020-06-04 DIAGNOSIS — E119 Type 2 diabetes mellitus without complications: Secondary | ICD-10-CM | POA: Diagnosis not present

## 2020-06-04 DIAGNOSIS — E785 Hyperlipidemia, unspecified: Secondary | ICD-10-CM | POA: Diagnosis not present

## 2020-06-04 DIAGNOSIS — E1169 Type 2 diabetes mellitus with other specified complication: Secondary | ICD-10-CM | POA: Diagnosis not present

## 2020-06-04 DIAGNOSIS — N400 Enlarged prostate without lower urinary tract symptoms: Secondary | ICD-10-CM

## 2020-06-04 DIAGNOSIS — E559 Vitamin D deficiency, unspecified: Secondary | ICD-10-CM

## 2020-06-04 NOTE — Patient Instructions (Addendum)
Licensed Clinical Social Worker Visit Information  Goals we discussed today:  Goals Addressed            This Visit's Progress   . Client will talk with LCSW in next 4 weeks to discuss vision challenges of client and client completion of ADLs (pt-stated)       CARE PLAN ENTRY   Current Barriers:  . Type 2 DM in client with chronic diagnoses of BPH, Vitamin D deficiency, Diabetic Retinopathy, HLD . Vision challenges  Clinical Social Work Clinical Goal(s):  Peter Kitchen LCSW to call client in next 4 weeks to talk with client about vision challlenges of client and client completion of ADLs  Interventions   Talked with client about CCM program services Talked with client about RNCM support with CCM program Talked with client about social support network Talked with client about ambulation challenges of client Talked with client about transport needs of client Talked with client about relaxation techniques (likes to be outdoors and do outdoor activities) Talked with client about his upcoming medical appointments Talked with client about his recovery from hernia surgery (he said he had hernia surgery in April of 2021)  Patient Self Care Activities:  Attends scheduled medical appointments Completes ADLs independently  Drives to appointments and to complete errands needed  Patient Self Care Deficits:  Peter Kitchen Vision challenges . Management of Type 2 DM   Initial goal documentation       Materials Provided: No  Follow Up Plan: LCSW to call client in next 4 weeks to talk with client about vision challenges of client and about client completion of ADLs  The patient verbalized understanding of instructions provided today and declined a print copy of patient instruction materials.   Peter Mathis.Peter Mathis MSW, LCSW Licensed Clinical Social Worker [facility] Care Management [phone]

## 2020-06-04 NOTE — Chronic Care Management (AMB) (Signed)
Chronic Care Management    Clinical Social Work Follow Up Note  06/04/2020 Name: Peter Mathis MRN: [id number] DOB: August 04, 1951  Peter Mathis is a 69 y.o. year old male who is a primary care patient of Stacks, Cletus Gash, MD. The CCM team was consulted for assistance with [employer].   Review of patient status, including review of consultants reports, other relevant assessments, and collaboration with appropriate care team members and the patient's provider was performed as part of comprehensive patient evaluation and provision of chronic care management services.    SDOH (Social Determinants of Health) assessments performed: Yes; risk for tobacco use; risk for depression  SDOH Interventions     Most Recent Value  SDOH Interventions  Depression Interventions/Treatment   -- [talked with client about LCSW support and about RNCM support]        Chronic Care Management from 06/04/2020 in [place]  PHQ-9 Total Score  2     GAD 7 : Generalized Anxiety Score 06/04/2020  Nervous, Anxious, on Edge 1  Control/stop worrying 0  Worry too much - different things 0  Trouble relaxing 0  Restless 0  Easily annoyed or irritable 0  Afraid - awful might happen 0  Total GAD 7 Score 1  Anxiety Difficulty Somewhat difficult    Outpatient Encounter Medications as of 06/04/2020  Medication Sig Note  . aspirin (ASPIRIN LOW DOSE) 81 MG EC tablet Take 81 mg by mouth daily.     [name] atorvastatin (LIPITOR) 80 MG tablet TAKE 1/2 TABLET BY MOUTH EVERY DAY AT BEDTIME (Patient taking differently: Take 40 mg by mouth at bedtime. )   . azelastine (ASTELIN) 0.1 % nasal spray 2 sprays each nostril at bedtime (Patient taking differently: Place 2 sprays into both nostrils at bedtime as needed (congestion). ) 04/11/2020: Patient has if needed but has not had to take recently  . Blood Glucose Monitoring Suppl (ONETOUCH VERIO) w/Device KIT 1 Stick by Does not apply route 2 (two) times daily.     . Cholecalciferol (VITAMIN D3) 5000 UNITS CAPS Take 5,000 Units by mouth daily.    . fluticasone (FLONASE) 50 MCG/ACT nasal spray SPRAY 2 SPRAYS INTO EACH NOSTRIL EVERY DAY (Patient taking differently: Place 2 sprays into both nostrils daily as needed for allergies. ) 04/11/2020: Patient has if needed but has not had to take recently  . glucose blood (ONE TOUCH ULTRA TEST) test strip Test bid, Dx E11.9   . Glycerin-Hypromellose-PEG 400 (VISINE DRY EYE) 0.2-0.2-1 % SOLN Place 1-2 drops into both eyes 4 (four) times daily as needed.   [name] JANUVIA 100 MG tablet TAKE 1 TABLET BY MOUTH EVERY DAY (Patient taking differently: Take 100 mg by mouth daily. )   . Krill Oil 1000 MG CAPS Take 1,000 mg by mouth daily.   [name] lisinopril (ZESTRIL) 5 MG tablet TAKE 1 TABLET BY MOUTH EVERY DAY (Patient taking differently: Take 5 mg by mouth daily. )   . metFORMIN (GLUCOPHAGE) 500 MG tablet Take 2 tablets (1,000 mg total) by mouth daily with breakfast AND 1 tablet (500 mg total) daily before supper. Take 1000 mg by mouth in the morning and 500 mg in the evening.   . Multiple Vitamin (MULTIVITAMIN WITH MINERALS) TABS tablet Take 1 tablet by mouth daily.   . ondansetron (ZOFRAN ODT) 4 MG disintegrating tablet Take 1 tablet (4 mg total) by mouth every 8 (eight) hours as needed for nausea or vomiting. 04/11/2020: Patient has if needed but has  not had to take recently   No facility-administered encounter medications on file as of 06/04/2020.    Goals Addressed            This Visit's Progress   . Client wil talk with LCSW in next 4 weeks to discuss vision challenges of client and client completion of ADLs (pt-stated)       CARE PLAN ENTRY   Current Barriers:  . Type 2 DM in client with chronic diagnoses of BPH, Vitamin D deficiency, Diabetic Retinopathy, HLD . Vision challenges  Clinical Social Work Clinical Goal(s):  [name] LCSW to call client in next 4 weeks to talk with client about vision challlenges of client and  client completion of ADLs  Interventions   Talked with client about CCM program services Talked with client about RNCM support with CCM program Talked with client about social support network Talked with client about ambulation challenges of client Talked with client about transport needs of client Talked with client about relaxation techniques (likes to be outdoors and do outdoor activities) Talked with client about his upcoming medical appointments Talked with client about his recovery from hernia surgery (he said he had hernia surgery in April of 2021)  Patient Self Care Activities:  Attends scheduled medical appointments Completes ADLs independently  Drives to appointments and to complete errands needed  Patient Self Care Deficits:  [name] Vision challenges . Management of Type 2 DM   Initial goal documentation        Follow Up Plan: LCSW to call client in next 4 weeks to talk with client about vision challenges of client and about client completion of ADLs  [name].Mabrey Howland MSW, LCSW Licensed Clinical Social Worker [facility] [phone]

## 2020-06-05 ENCOUNTER — Ambulatory Visit (INDEPENDENT_AMBULATORY_CARE_PROVIDER_SITE_OTHER): Payer: PPO | Admitting: Family Medicine

## 2020-06-05 ENCOUNTER — Encounter: Payer: Self-pay | Admitting: Family Medicine

## 2020-06-05 ENCOUNTER — Ambulatory Visit: Payer: PPO | Admitting: Family Medicine

## 2020-06-05 ENCOUNTER — Other Ambulatory Visit: Payer: Self-pay

## 2020-06-05 VITALS — BP 137/77 | HR 78 | Temp 97.8°F | Resp 20 | Ht 71.0 in | Wt 172.5 lb

## 2020-06-05 DIAGNOSIS — N400 Enlarged prostate without lower urinary tract symptoms: Secondary | ICD-10-CM | POA: Diagnosis not present

## 2020-06-05 DIAGNOSIS — E119 Type 2 diabetes mellitus without complications: Secondary | ICD-10-CM | POA: Diagnosis not present

## 2020-06-05 DIAGNOSIS — E559 Vitamin D deficiency, unspecified: Secondary | ICD-10-CM

## 2020-06-05 DIAGNOSIS — E11319 Type 2 diabetes mellitus with unspecified diabetic retinopathy without macular edema: Secondary | ICD-10-CM

## 2020-06-05 DIAGNOSIS — E785 Hyperlipidemia, unspecified: Secondary | ICD-10-CM | POA: Diagnosis not present

## 2020-06-05 DIAGNOSIS — E1169 Type 2 diabetes mellitus with other specified complication: Secondary | ICD-10-CM

## 2020-06-05 DIAGNOSIS — N521 Erectile dysfunction due to diseases classified elsewhere: Secondary | ICD-10-CM | POA: Diagnosis not present

## 2020-06-05 DIAGNOSIS — N5201 Erectile dysfunction due to arterial insufficiency: Secondary | ICD-10-CM

## 2020-06-05 LAB — BAYER DCA HB A1C WAIVED: HB A1C (BAYER DCA - WAIVED): 7.7 % — ABNORMAL HIGH (ref <7.0)

## 2020-06-05 MED ORDER — ATORVASTATIN CALCIUM 80 MG PO TABS: ORAL_TABLET | ORAL | 1 refills | Status: DC

## 2020-06-05 NOTE — Progress Notes (Signed)
Subjective:  Patient ID: Peter Mathis,  male    DOB: [birth year]  Age: 69 y.o.    CC: Medical Management of Chronic Issues   HPI Trigger Peter Mathis presents for  follow-up of hypertension. Patient has no history of headache chest pain or shortness of breath or recent cough. Patient also denies symptoms of TIA such as numbness weakness lateralizing. Patient denies side effects from medication. States taking it regularly.  Patient also  in for follow-up of elevated cholesterol. Doing well without complaints on current medication. Denies side effects  including myalgia and arthralgia and nausea. Also in today for liver function testing. Currently no chest pain, shortness of breath or other cardiovascular related symptoms noted.  Follow-up of diabetes. Patient does check blood sugar at home. Readings run between 140 and 170 fasting, but around 120 post prandial Patient denies symptoms such as excessive hunger or urinary frequency, excessive hunger, nausea No significant hypoglycemic spells noted. Medications reviewed. Pt reports taking them regularly. Pt. denies complication/adverse reaction today.  Peter Mathis normally exercises daily but Peter Mathis recently had left inguinal hernia surgery and this is caused his activity to decrease.   History Peter Mathis has a past medical history of Chest discomfort (1989), Diverticulosis, DM type 2 (diabetes mellitus, type 2) ([place]), Heart murmur, History of kidney stones, and Hyperlipidemia.   Peter Mathis has a past surgical history that includes Tonsillectomy and adenoidectomy (01/1955); Ganglion cyst excision (Right); and Inguinal hernia repair (Left, 04/03/2020).   His family history includes Diabetes in his mother; Heart attack in his father.Peter Mathis reports that Peter Mathis has never smoked. Peter Mathis has never used smokeless tobacco. Peter Mathis reports that Peter Mathis does not drink alcohol or use drugs.  Current Outpatient Medications on File Prior to Visit  Medication Sig Dispense Refill  . aspirin (ASPIRIN LOW  DOSE) 81 MG EC tablet Take 81 mg by mouth daily.      [name] azelastine (ASTELIN) 0.1 % nasal spray 2 sprays each nostril at bedtime (Patient taking differently: Place 2 sprays into both nostrils at bedtime as needed (congestion). ) 90 mL 3  . Blood Glucose Monitoring Suppl (ONETOUCH VERIO) w/Device KIT 1 Stick by Does not apply route 2 (two) times daily. 1 kit 0  . Cholecalciferol (VITAMIN D3) 5000 UNITS CAPS Take 5,000 Units by mouth daily.     . fluticasone (FLONASE) 50 MCG/ACT nasal spray SPRAY 2 SPRAYS INTO EACH NOSTRIL EVERY DAY (Patient taking differently: Place 2 sprays into both nostrils daily as needed for allergies. ) 48 mL 1  . glucose blood (ONE TOUCH ULTRA TEST) test strip Test bid, Dx E11.9 300 each 3  . JANUVIA 100 MG tablet TAKE 1 TABLET BY MOUTH EVERY DAY (Patient taking differently: Take 100 mg by mouth daily. ) 90 tablet 0  . Krill Oil 1000 MG CAPS Take 1,000 mg by mouth daily.    [name] lisinopril (ZESTRIL) 5 MG tablet TAKE 1 TABLET BY MOUTH EVERY DAY (Patient taking differently: Take 5 mg by mouth daily. ) 90 tablet 0  . metFORMIN (GLUCOPHAGE) 500 MG tablet Take 2 tablets (1,000 mg total) by mouth daily with breakfast AND 1 tablet (500 mg total) daily before supper. Take 1000 mg by mouth in the morning and 500 mg in the evening. 270 tablet 1  . Multiple Vitamin (MULTIVITAMIN WITH MINERALS) TABS tablet Take 1 tablet by mouth daily.    . Glycerin-Hypromellose-PEG 400 (VISINE DRY EYE) 0.2-0.2-1 % SOLN Place 1-2 drops into both eyes 4 (four) times daily as needed.    [name]  ondansetron (ZOFRAN ODT) 4 MG disintegrating tablet Take 1 tablet (4 mg total) by mouth every 8 (eight) hours as needed for nausea or vomiting. (Patient not taking: Reported on 06/05/2020) 20 tablet 0   No current facility-administered medications on file prior to visit.    ROS Review of Systems  Constitutional: Positive for activity change. Negative for fever.  Respiratory: Negative for shortness of breath.     Cardiovascular: Negative for chest pain.  Musculoskeletal: Negative for arthralgias.  Skin: Negative for rash.    Objective:  BP 137/77   Pulse 78   Temp 97.8 F (36.6 C) (Temporal)   Resp 20   Ht _0  (1.803 m)   Wt 172 lb 8 oz (78.2 kg)   SpO2 98%   BMI 24.06 kg/m   BP Readings from Last 3 Encounters:  06/05/20 137/77  04/30/20 (!) 163/75  04/11/20 (!) 169/71    Wt Readings from Last 3 Encounters:  06/05/20 172 lb 8 oz (78.2 kg)  04/30/20 172 lb (78 kg)  04/11/20 171 lb (77.6 kg)     Physical Exam Vitals reviewed.  Constitutional:      Appearance: Peter Mathis is well-developed.  HENT:     Head: Normocephalic and atraumatic.     Right Ear: Tympanic membrane and external ear normal. No decreased hearing noted.     Left Ear: Tympanic membrane and external ear normal. No decreased hearing noted.     Mouth/Throat:     Pharynx: No oropharyngeal exudate or posterior oropharyngeal erythema.  Eyes:     Pupils: Pupils are equal, round, and reactive to light.  Cardiovascular:     Rate and Rhythm: Normal rate and regular rhythm.     Heart sounds: No murmur.  Pulmonary:     Effort: No respiratory distress.     Breath sounds: Normal breath sounds.  Abdominal:     General: Bowel sounds are normal.     Palpations: Abdomen is soft. There is no mass.     Tenderness: There is no abdominal tenderness.  Musculoskeletal:     Cervical back: Normal range of motion and neck supple.     Diabetic Foot Exam - Simple   No data filed        Assessment & Plan:   Peter Mathis was seen today for medical management of chronic issues.  Diagnoses and all orders for this visit:  Type 2 diabetes mellitus without complication, without long-term current use of insulin (HCC) -     Bayer DCA Hb A1c Waived -     Microalbumin / creatinine urine ratio -     CBC with Differential/Platelet -     CMP14+EGFR -     Lipid panel  Hyperlipidemia associated with type 2 diabetes mellitus (HCC) -      Microalbumin / creatinine urine ratio -     CBC with Differential/Platelet -     CMP14+EGFR -     Lipid panel  Benign prostatic hyperplasia without lower urinary tract symptoms  Erectile dysfunction due to arterial insufficiency  Erectile dysfunction due to type 2 diabetes mellitus ([place])  Diabetic retinopathy associated with type 2 diabetes mellitus, macular edema presence unspecified, unspecified laterality, unspecified retinopathy severity (HCC)  Vitamin D deficiency -     VITAMIN D 25 Hydroxy (Vit-D Deficiency, Fractures)  Other orders -     atorvastatin (LIPITOR) 80 MG tablet; TAKE 1/2 TABLET BY MOUTH EVERY DAY AT BEDTIME   I am having Peter Mathis maintain his aspirin, Vitamin  D3, glucose blood, OneTouch Verio, azelastine, lisinopril, Krill Oil, Januvia, fluticasone, ondansetron, multivitamin with minerals, Visine Dry Eye, metFORMIN, and atorvastatin.  Meds ordered this encounter  Medications  . atorvastatin (LIPITOR) 80 MG tablet    Sig: TAKE 1/2 TABLET BY MOUTH EVERY DAY AT BEDTIME    Dispense:  45 tablet    Refill:  1     Follow-up: Return in about 3 months (around 09/05/2020).  [name], M.D.

## 2020-06-06 LAB — LIPID PANEL
Chol/HDL Ratio: 3 ratio (ref 0.0–5.0)
Cholesterol, Total: 126 mg/dL (ref 100–199)
HDL: 42 mg/dL (ref >39)
LDL Chol Calc (NIH): 64 mg/dL (ref 0–99)
Triglycerides: 111 mg/dL (ref 0–149)
VLDL Cholesterol Cal: 20 mg/dL (ref 5–40)

## 2020-06-06 LAB — CMP14+EGFR
ALT: 27 IU/L (ref 0–44)
AST: 20 IU/L (ref 0–40)
Albumin/Globulin Ratio: 1.7 (ref 1.2–2.2)
Albumin: 4.5 g/dL (ref 3.8–4.8)
Alkaline Phosphatase: 64 IU/L (ref 48–121)
BUN/Creatinine Ratio: 13 (ref 10–24)
BUN: 13 mg/dL (ref 8–27)
Bilirubin Total: 0.5 mg/dL (ref 0.0–1.2)
CO2: 24 mmol/L (ref 20–29)
Calcium: 9.8 mg/dL (ref 8.6–10.2)
Chloride: 100 mmol/L (ref 96–106)
Creatinine, Ser: 0.98 mg/dL (ref 0.76–1.27)
GFR calc Af Amer: 90 mL/min/{1.73_m2} (ref >59)
GFR calc non Af Amer: 78 mL/min/{1.73_m2} (ref >59)
Globulin, Total: 2.6 g/dL (ref 1.5–4.5)
Glucose: 188 mg/dL — ABNORMAL HIGH (ref 65–99)
Potassium: 5.5 mmol/L — ABNORMAL HIGH (ref 3.5–5.2)
Sodium: 137 mmol/L (ref 134–144)
Total Protein: 7.1 g/dL (ref 6.0–8.5)

## 2020-06-06 LAB — CBC WITH DIFFERENTIAL/PLATELET
Basophils Absolute: 0 10*3/uL (ref 0.0–0.2)
Basos: 0 %
EOS (ABSOLUTE): 0.1 10*3/uL (ref 0.0–0.4)
Eos: 1 %
Hematocrit: 46.9 % (ref 37.5–51.0)
Hemoglobin: 15.2 g/dL (ref 13.0–17.7)
Immature Grans (Abs): 0 10*3/uL (ref 0.0–0.1)
Immature Granulocytes: 0 %
Lymphocytes Absolute: 2.6 10*3/uL (ref 0.7–3.1)
Lymphs: 29 %
MCH: 28.9 pg (ref 26.6–33.0)
MCHC: 32.4 g/dL (ref 31.5–35.7)
MCV: 89 fL (ref 79–97)
Monocytes Absolute: 0.8 10*3/uL (ref 0.1–0.9)
Monocytes: 9 %
Neutrophils Absolute: 5.5 10*3/uL (ref 1.4–7.0)
Neutrophils: 61 %
Platelets: 272 10*3/uL (ref 150–450)
RBC: 5.26 x10E6/uL (ref 4.14–5.80)
RDW: 12.4 % (ref 11.6–15.4)
WBC: 9 10*3/uL (ref 3.4–10.8)

## 2020-06-06 LAB — MICROALBUMIN / CREATININE URINE RATIO
Creatinine, Urine: 214.8 mg/dL
Microalb/Creat Ratio: 10 mg/g creat (ref 0–29)
Microalbumin, Urine: 21.5 ug/mL

## 2020-06-06 LAB — VITAMIN D 25 HYDROXY (VIT D DEFICIENCY, FRACTURES): Vit D, 25-Hydroxy: 64.9 ng/mL (ref 30.0–100.0)

## 2020-06-14 ENCOUNTER — Other Ambulatory Visit: Payer: Self-pay | Admitting: *Deleted

## 2020-06-14 DIAGNOSIS — E875 Hyperkalemia: Secondary | ICD-10-CM

## 2020-06-17 ENCOUNTER — Other Ambulatory Visit: Payer: PPO

## 2020-06-17 ENCOUNTER — Other Ambulatory Visit: Payer: Self-pay

## 2020-06-17 DIAGNOSIS — E875 Hyperkalemia: Secondary | ICD-10-CM

## 2020-06-18 LAB — BMP8+EGFR
BUN/Creatinine Ratio: 16 (ref 10–24)
BUN: 14 mg/dL (ref 8–27)
CO2: 24 mmol/L (ref 20–29)
Calcium: 9.5 mg/dL (ref 8.6–10.2)
Chloride: 101 mmol/L (ref 96–106)
Creatinine, Ser: 0.86 mg/dL (ref 0.76–1.27)
GFR calc Af Amer: 102 mL/min/{1.73_m2} (ref >59)
GFR calc non Af Amer: 88 mL/min/{1.73_m2} (ref >59)
Glucose: 178 mg/dL — ABNORMAL HIGH (ref 65–99)
Potassium: 4.7 mmol/L (ref 3.5–5.2)
Sodium: 137 mmol/L (ref 134–144)

## 2020-06-19 ENCOUNTER — Other Ambulatory Visit: Payer: Self-pay | Admitting: Nurse Practitioner

## 2020-06-19 MED ORDER — LISINOPRIL 5 MG PO TABS: 5.0000 mg | ORAL_TABLET | Freq: Every day | ORAL | 0 refills | Status: DC

## 2020-06-19 NOTE — Addendum Note (Signed)
Addended by: Antonietta Barcelona D on: 06/19/2020 11:03 AM   Modules accepted: Orders

## 2020-07-08 ENCOUNTER — Telehealth: Payer: PPO

## 2020-08-01 DIAGNOSIS — H2513 Age-related nuclear cataract, bilateral: Secondary | ICD-10-CM | POA: Diagnosis not present

## 2020-08-01 DIAGNOSIS — H4901 Third [oculomotor] nerve palsy, right eye: Secondary | ICD-10-CM | POA: Diagnosis not present

## 2020-08-01 DIAGNOSIS — H02431 Paralytic ptosis of right eyelid: Secondary | ICD-10-CM | POA: Diagnosis not present

## 2020-08-01 DIAGNOSIS — H532 Diplopia: Secondary | ICD-10-CM | POA: Diagnosis not present

## 2020-08-09 ENCOUNTER — Ambulatory Visit (INDEPENDENT_AMBULATORY_CARE_PROVIDER_SITE_OTHER): Payer: PPO | Admitting: Licensed Clinical Social Worker

## 2020-08-09 DIAGNOSIS — E785 Hyperlipidemia, unspecified: Secondary | ICD-10-CM | POA: Diagnosis not present

## 2020-08-09 DIAGNOSIS — E11319 Type 2 diabetes mellitus with unspecified diabetic retinopathy without macular edema: Secondary | ICD-10-CM | POA: Diagnosis not present

## 2020-08-09 DIAGNOSIS — N400 Enlarged prostate without lower urinary tract symptoms: Secondary | ICD-10-CM | POA: Diagnosis not present

## 2020-08-09 DIAGNOSIS — E559 Vitamin D deficiency, unspecified: Secondary | ICD-10-CM

## 2020-08-09 DIAGNOSIS — E1169 Type 2 diabetes mellitus with other specified complication: Secondary | ICD-10-CM | POA: Diagnosis not present

## 2020-08-09 NOTE — Patient Instructions (Addendum)
Licensed Clinical Social Worker Visit Information  Goals we discussed today:    .  Client wil talk with LCSW in next 4 weeks to discuss vision challenges of client and client completion of ADLs (pt-stated)        CARE PLAN ENTRY   Current Barriers:   Type 2 DM in client with chronic diagnoses of BPH, Vitamin D deficiency, Diabetic Retinopathy, HLD  Vision challenges  Clinical Social Work Clinical Goal(s):   LCSW to call client in next 4 weeks to talk with client about vision challlenges of client and client completion of ADLs  Interventions   Talked with Trellis Paganini, spouse of client, about CCM program services Talked with Eugine Bubb about RNCM support with CCM program Talked with Peter about social support network for client (spouse is supportive) Talked with Peter about ambulation challenges of client Talked with [place] about transport needs of client Talked with [place] about relaxation techniques of client  (likes to be outdoors and do outdoor activities) Talked with Arrie Aran Riedinger,about client's upcoming medical appointments Talked with [facility] about client recovery from hernia surgery (he said he had hernia surgery in April of 2021) Talked with Trellis Paganini, spouse of client, about pain issues of client Talked with Arrie Aran Delorenzo about vision of client Talked with Peter about ADLs completion of client Talked with Peter about mood of client  Patient Self Care Activities:  Attends scheduled medical appointments Completes ADLs independently  Drives to appointments and to complete errands needed  Patient Self Care Deficits:   Vision challenges  Management of Type 2 DM   Initial goal documentation     Follow Up Plan:LCSW to call client /spouse of client in next 4 weeks to talk with client/spouse about vision challenges of client and about client completion of ADLs  Materials Provided: No  The patient/Peter Mathis, spouse of client,t verbalized understanding of  instructions provided today and declined a print copy of patient instruction materials.   [name].Peter Mathis MSW, LCSW Licensed Clinical Social Worker [facility] [phone]

## 2020-08-09 NOTE — Chronic Care Management (AMB) (Signed)
Chronic Care Management    Clinical Social Work Follow Up Note  08/09/2020 Name: RAYLAN HANTON MRN: [id number] DOB: 01/08/1951  [name] Pheasant is a 69 y.o. year old male who is a primary care patient of Stacks, Cletus Gash, MD. The CCM team was consulted for assistance with [name] .   Review of patient status, including review of consultants reports, other relevant assessments, and collaboration with appropriate care team members and the patient's provider was performed as part of comprehensive patient evaluation and provision of chronic care management services.    SDOH (Social Determinants of Health) assessments performed: No;risk for tobacco use; risk for depression; risk for stress; risk for physical inactivity    Chronic Care Management from 06/04/2020 in [place]  PHQ-9 Total Score 2      GAD 7 : Generalized Anxiety Score 06/04/2020  Nervous, Anxious, on Edge 1  Control/stop worrying 0  Worry too much - different things 0  Trouble relaxing 0  Restless 0  Easily annoyed or irritable 0  Afraid - awful might happen 0  Total GAD 7 Score 1  Anxiety Difficulty Somewhat difficult    Outpatient Encounter Medications as of 08/09/2020  Medication Sig Note  . aspirin (ASPIRIN LOW DOSE) 81 MG EC tablet Take 81 mg by mouth daily.     [name] atorvastatin (LIPITOR) 80 MG tablet TAKE 1/2 TABLET BY MOUTH EVERY DAY AT BEDTIME   . azelastine (ASTELIN) 0.1 % nasal spray 2 sprays each nostril at bedtime (Patient taking differently: Place 2 sprays into both nostrils at bedtime as needed (congestion). ) 04/11/2020: Patient has if needed but has not had to take recently  . Blood Glucose Monitoring Suppl (ONETOUCH VERIO) w/Device KIT 1 Stick by Does not apply route 2 (two) times daily.   . Cholecalciferol (VITAMIN D3) 5000 UNITS CAPS Take 5,000 Units by mouth daily.    . fluticasone (FLONASE) 50 MCG/ACT nasal spray SPRAY 2 SPRAYS INTO EACH NOSTRIL EVERY DAY (Patient  taking differently: Place 2 sprays into both nostrils daily as needed for allergies. ) 04/11/2020: Patient has if needed but has not had to take recently  . glucose blood (ONE TOUCH ULTRA TEST) test strip Test bid, Dx E11.9   . Glycerin-Hypromellose-PEG 400 (VISINE DRY EYE) 0.2-0.2-1 % SOLN Place 1-2 drops into both eyes 4 (four) times daily as needed.   [name] JANUVIA 100 MG tablet TAKE 1 TABLET BY MOUTH EVERY DAY   . Krill Oil 1000 MG CAPS Take 1,000 mg by mouth daily.   [name] lisinopril (ZESTRIL) 5 MG tablet Take 1 tablet (5 mg total) by mouth daily.   . metFORMIN (GLUCOPHAGE) 500 MG tablet Take 2 tablets (1,000 mg total) by mouth daily with breakfast AND 1 tablet (500 mg total) daily before supper. Take 1000 mg by mouth in the morning and 500 mg in the evening.   . Multiple Vitamin (MULTIVITAMIN WITH MINERALS) TABS tablet Take 1 tablet by mouth daily.   . ondansetron (ZOFRAN ODT) 4 MG disintegrating tablet Take 1 tablet (4 mg total) by mouth every 8 (eight) hours as needed for nausea or vomiting. (Patient not taking: Reported on 06/05/2020) 04/11/2020: Patient has if needed but has not had to take recently   No facility-administered encounter medications on file as of 08/09/2020.    Goals    .  Client wil talk with LCSW in next 4 weeks to discuss vision challenges of client and client completion of ADLs (pt-stated)  CARE PLAN ENTRY   Current Barriers:  . Type 2 DM in client with chronic diagnoses of BPH, Vitamin D deficiency, Diabetic Retinopathy, HLD . Vision challenges  Clinical Social Work Clinical Goal(s):  [name] LCSW to call client in next 4 weeks to talk with client about vision challlenges of client and client completion of ADLs  Interventions   Talked with Trellis Paganini, spouse of client, about CCM program services Talked with Carl Butner about RNCM support with CCM program Talked with Dawn about social support network for client (spouse is supportive) Talked with Dawn about ambulation  challenges of client Talked with [place] about transport needs of client Talked with Dawn about relaxation techniques of client  (likes to be outdoors and do outdoor activities) Talked with Arrie Aran Holzer,about client's upcoming medical appointments Talked with [facility] about client recovery from hernia surgery (he said he had hernia surgery in April of 2021) Talked with Trellis Paganini, spouse of client, about pain issues of client Talked with Ryley Bachtel about vision of client Talked with [place] about ADLs completion of client Talked with Dawn about mood of client  Patient Self Care Activities:  Attends scheduled medical appointments Completes ADLs independently  Drives to appointments and to complete errands needed  Patient Self Care Deficits:  [name] Vision challenges . Management of Type 2 DM   Initial goal documentation     Follow Up Plan: LCSW to call client /spouse of client in next 4 weeks to talk with client/spouse about vision challenges of client and about client completion of ADLs  [name].Shawnay Bramel MSW, LCSW Licensed Clinical Social Worker [facility] [phone]

## 2020-09-04 ENCOUNTER — Ambulatory Visit: Payer: PPO | Admitting: Family Medicine

## 2020-09-16 ENCOUNTER — Other Ambulatory Visit: Payer: Self-pay | Admitting: Family Medicine

## 2020-09-16 ENCOUNTER — Telehealth: Payer: PPO

## 2020-09-25 ENCOUNTER — Ambulatory Visit: Payer: PPO | Admitting: Family Medicine

## 2020-10-23 ENCOUNTER — Ambulatory Visit: Payer: PPO | Admitting: Licensed Clinical Social Worker

## 2020-10-23 DIAGNOSIS — N400 Enlarged prostate without lower urinary tract symptoms: Secondary | ICD-10-CM

## 2020-10-23 DIAGNOSIS — E785 Hyperlipidemia, unspecified: Secondary | ICD-10-CM

## 2020-10-23 DIAGNOSIS — E559 Vitamin D deficiency, unspecified: Secondary | ICD-10-CM

## 2020-10-23 DIAGNOSIS — E11319 Type 2 diabetes mellitus with unspecified diabetic retinopathy without macular edema: Secondary | ICD-10-CM

## 2020-10-23 DIAGNOSIS — E1169 Type 2 diabetes mellitus with other specified complication: Secondary | ICD-10-CM

## 2020-10-23 NOTE — Chronic Care Management (AMB) (Signed)
Chronic Care Management    Clinical Social Work Follow Up Note  10/23/2020 Name: Peter Mathis MRN: [id number] DOB: Dec 23, 1951  Peter Mathis is a 69 y.o. year old male who is a primary care patient of Peter Mathis, Peter Gash, MD. The CCM team was consulted for assistance with Peter Mathis .   Review of patient status, including review of consultants reports, other relevant assessments, and collaboration with appropriate care team members and the patient's provider was performed as part of comprehensive patient evaluation and provision of chronic care management services.    SDOH (Social Determinants of Health) assessments performed: No; risk for depression; risk for tobacco use; risk for stress; risk for physical inactivity    Chronic Care Management from 06/04/2020 in [place]  PHQ-9 Total Score 2      GAD 7 : Generalized Anxiety Score 06/04/2020  Nervous, Anxious, on Edge 1  Control/stop worrying 0  Worry too much - different things 0  Trouble relaxing 0  Restless 0  Easily annoyed or irritable 0  Afraid - awful might happen 0  Total GAD 7 Score 1  Anxiety Difficulty Somewhat difficult    Outpatient Encounter Medications as of 10/23/2020  Medication Sig Note  . aspirin (ASPIRIN LOW DOSE) 81 MG EC tablet Take 81 mg by mouth daily.     [name] atorvastatin (LIPITOR) 80 MG tablet TAKE 1/2 TABLET BY MOUTH EVERY DAY AT BEDTIME   . azelastine (ASTELIN) 0.1 % nasal spray 2 sprays each nostril at bedtime (Patient taking differently: Place 2 sprays into both nostrils at bedtime as needed (congestion). ) 04/11/2020: Patient has if needed but has not had to take recently  . Blood Glucose Monitoring Suppl (ONETOUCH VERIO) w/Device KIT 1 Stick by Does not apply route 2 (two) times daily.   . Cholecalciferol (VITAMIN D3) 5000 UNITS CAPS Take 5,000 Units by mouth daily.    . fluticasone (FLONASE) 50 MCG/ACT nasal spray SPRAY 2 SPRAYS INTO EACH NOSTRIL EVERY DAY (Patient  taking differently: Place 2 sprays into both nostrils daily as needed for allergies. ) 04/11/2020: Patient has if needed but has not had to take recently  . glucose blood (ONE TOUCH ULTRA TEST) test strip Test bid, Dx E11.9   . Glycerin-Hypromellose-PEG 400 (VISINE DRY EYE) 0.2-0.2-1 % SOLN Place 1-2 drops into both eyes 4 (four) times daily as needed.   [name] JANUVIA 100 MG tablet TAKE 1 TABLET BY MOUTH EVERY DAY   . Krill Oil 1000 MG CAPS Take 1,000 mg by mouth daily.   [name] lisinopril (ZESTRIL) 5 MG tablet TAKE 1 TABLET BY MOUTH EVERY DAY   . metFORMIN (GLUCOPHAGE) 500 MG tablet Take 2 tablets (1,000 mg total) by mouth daily with breakfast AND 1 tablet (500 mg total) daily before supper. Take 1000 mg by mouth in the morning and 500 mg in the evening.   . Multiple Vitamin (MULTIVITAMIN WITH MINERALS) TABS tablet Take 1 tablet by mouth daily.   . ondansetron (ZOFRAN ODT) 4 MG disintegrating tablet Take 1 tablet (4 mg total) by mouth every 8 (eight) hours as needed for nausea or vomiting. (Patient not taking: Reported on 06/05/2020) 04/11/2020: Patient has if needed but has not had to take recently   No facility-administered encounter medications on file as of 10/23/2020.    Goals    .  Client wil talk with LCSW in next 4 weeks to discuss vision challenges of client and client completion of ADLs (pt-stated)  CARE PLAN ENTRY   Current Barriers:  . Type 2 DM in client with chronic diagnoses of BPH, Vitamin D deficiency, Diabetic Retinopathy, HLD . Vision challenges  Clinical Social Work Clinical Goal(s):  [name] LCSW to call client in next 4 weeks to talk with client about vision challlenges of client and client completion of ADLs  Interventions   Talked with client about CCM program services Talked with client about RNCM support with CCM program Talked with client about social support network Talked with client about ambulation challenges of client Talked with client about transport needs of  client Talked with client about relaxation techniques (likes to be outdoors and do outdoor activities) Talked with client about his upcoming medical appointments Talked with client about his completion of ADLs Talked with Peter Mathis about vision of client Talked with client about upcoming client appointments Talked with client about health needs of his parents   Patient Self Care Activities:  Attends scheduled medical appointments Completes ADLs independently  Drives to appointments and to complete errands needed  Patient Self Care Deficits:  [name] Vision challenges . Management of Type 2 DM   Initial goal documentation     Follow Up Plan: LCSW to call client /spouse of client in next 4 weeks to talk with client/spouse about vision challenges of client and about client completion of ADLs  Peter Mathis.Peter Mathis MSW, LCSW Licensed Clinical Social Worker [facility] [phone]

## 2020-10-23 NOTE — Patient Instructions (Addendum)
Licensed Clinical Social Worker Visit Information  Goals we discussed today:  .  Client wil talk with LCSW in next 4 weeks to discuss vision challenges of client and client completion of ADLs (pt-stated)        CARE PLAN ENTRY   Current Barriers:   Type 2 DM in client with chronic diagnoses of BPH, Vitamin D deficiency, Diabetic Retinopathy, HLD  Vision challenges  Clinical Social Work Clinical Goal(s):   LCSW to call client in next 4 weeks to talk with client about vision challlenges of client and client completion of ADLs  Interventions   Talked with client about CCM program services Talked with client about RNCM support with CCM program Talked with client about social support network Talked with client about ambulation challenges of client Talked with client about transport needs of client Talked with client about relaxation techniques (likes to be outdoors and do outdoor activities) Talked with client about his upcoming medical appointments Talked with client about his completion of ADLs Talked with [place] about vision of client Talked with client about upcoming client appointments Talked with client about health needs of his parents   Patient Self Care Activities:  Attends scheduled medical appointments Completes ADLs independently  Drives to appointments and to complete errands needed  Patient Self Care Deficits:   Vision challenges  Management of Type 2 DM   Initial goal documentation     Follow Up Plan: LCSW to call client/spouse of clientin next 4 weeks to talk with client/spouseabout vision challenges of client and about client completion of ADLs  Materials Provided: No  The patient verbalized understanding of instructions provided today and declined a print copy of patient instruction materials.   [name].Shakeem Stern MSW, LCSW Licensed Clinical Social Worker [facility] [phone]

## 2020-10-28 ENCOUNTER — Encounter: Payer: Self-pay | Admitting: Family Medicine

## 2020-10-28 ENCOUNTER — Ambulatory Visit (INDEPENDENT_AMBULATORY_CARE_PROVIDER_SITE_OTHER): Payer: PPO | Admitting: Family Medicine

## 2020-10-28 ENCOUNTER — Other Ambulatory Visit: Payer: Self-pay

## 2020-10-28 ENCOUNTER — Other Ambulatory Visit: Payer: Self-pay | Admitting: Family Medicine

## 2020-10-28 VITALS — BP 139/75 | HR 77 | Temp 97.6°F | Resp 20 | Ht 71.0 in | Wt 174.5 lb

## 2020-10-28 DIAGNOSIS — E785 Hyperlipidemia, unspecified: Secondary | ICD-10-CM

## 2020-10-28 DIAGNOSIS — E119 Type 2 diabetes mellitus without complications: Secondary | ICD-10-CM | POA: Diagnosis not present

## 2020-10-28 DIAGNOSIS — Z23 Encounter for immunization: Secondary | ICD-10-CM

## 2020-10-28 DIAGNOSIS — E1169 Type 2 diabetes mellitus with other specified complication: Secondary | ICD-10-CM | POA: Diagnosis not present

## 2020-10-28 LAB — CBC WITH DIFFERENTIAL/PLATELET
Basophils Absolute: 0 10*3/uL (ref 0.0–0.2)
Basos: 0 %
EOS (ABSOLUTE): 0.3 10*3/uL (ref 0.0–0.4)
Eos: 3 %
Hematocrit: 45.5 % (ref 37.5–51.0)
Hemoglobin: 15.2 g/dL (ref 13.0–17.7)
Immature Grans (Abs): 0 10*3/uL (ref 0.0–0.1)
Immature Granulocytes: 0 %
Lymphocytes Absolute: 2.2 10*3/uL (ref 0.7–3.1)
Lymphs: 26 %
MCH: 29.4 pg (ref 26.6–33.0)
MCHC: 33.4 g/dL (ref 31.5–35.7)
MCV: 88 fL (ref 79–97)
Monocytes Absolute: 0.6 10*3/uL (ref 0.1–0.9)
Monocytes: 7 %
Neutrophils Absolute: 5.2 10*3/uL (ref 1.4–7.0)
Neutrophils: 64 %
Platelets: 262 10*3/uL (ref 150–450)
RBC: 5.17 x10E6/uL (ref 4.14–5.80)
RDW: 12 % (ref 11.6–15.4)
WBC: 8.2 10*3/uL (ref 3.4–10.8)

## 2020-10-28 LAB — CMP14+EGFR
ALT: 22 IU/L (ref 0–44)
AST: 19 IU/L (ref 0–40)
Albumin/Globulin Ratio: 1.7 (ref 1.2–2.2)
Albumin: 4.2 g/dL (ref 3.8–4.8)
Alkaline Phosphatase: 53 IU/L (ref 44–121)
BUN/Creatinine Ratio: 13 (ref 10–24)
BUN: 12 mg/dL (ref 8–27)
Bilirubin Total: 0.6 mg/dL (ref 0.0–1.2)
CO2: 26 mmol/L (ref 20–29)
Calcium: 9.7 mg/dL (ref 8.6–10.2)
Chloride: 100 mmol/L (ref 96–106)
Creatinine, Ser: 0.91 mg/dL (ref 0.76–1.27)
GFR calc Af Amer: 98 mL/min/{1.73_m2} (ref >59)
GFR calc non Af Amer: 85 mL/min/{1.73_m2} (ref >59)
Globulin, Total: 2.5 g/dL (ref 1.5–4.5)
Glucose: 205 mg/dL — ABNORMAL HIGH (ref 65–99)
Potassium: 4.8 mmol/L (ref 3.5–5.2)
Sodium: 138 mmol/L (ref 134–144)
Total Protein: 6.7 g/dL (ref 6.0–8.5)

## 2020-10-28 LAB — LIPID PANEL
Chol/HDL Ratio: 2.8 ratio (ref 0.0–5.0)
Cholesterol, Total: 102 mg/dL (ref 100–199)
HDL: 37 mg/dL — ABNORMAL LOW (ref >39)
LDL Chol Calc (NIH): 45 mg/dL (ref 0–99)
Triglycerides: 108 mg/dL (ref 0–149)
VLDL Cholesterol Cal: 20 mg/dL (ref 5–40)

## 2020-10-28 LAB — BAYER DCA HB A1C WAIVED: HB A1C (BAYER DCA - WAIVED): 8 % — ABNORMAL HIGH (ref <7.0)

## 2020-10-28 MED ORDER — LISINOPRIL 5 MG PO TABS: 5.0000 mg | ORAL_TABLET | Freq: Every day | ORAL | 1 refills | Status: DC

## 2020-10-28 MED ORDER — SITAGLIPTIN PHOSPHATE 100 MG PO TABS: 100.0000 mg | ORAL_TABLET | Freq: Every day | ORAL | 1 refills | Status: DC

## 2020-10-28 NOTE — Progress Notes (Addendum)
Subjective:  Patient ID: Peter Mathis, male    DOB: [birth year]  Age: 69 y.o. MRN: [id number]  CC: Medical Management of Chronic Issues   HPI Peter Mathis presents for Follow-up of diabetes. Patient checks blood sugar at home.   140 fasting and 160 - 200 postprandial Patient denies symptoms such as polyuria, polydipsia, excessive hunger, nausea No significant hypoglycemic spells noted. Medications reviewed. Pt reports taking them regularly without complication/adverse reaction being reported today.  His major activity is cutting and hauling wood for exercise. He has backed off on the treadmill. Diet variable  History Peter Mathis has a past medical history of Chest discomfort (1989), Diverticulosis, DM type 2 (diabetes mellitus, type 2) ([place]), Heart murmur, History of kidney stones, and Hyperlipidemia.   He has a past surgical history that includes Tonsillectomy and adenoidectomy (01/1955); Ganglion cyst excision (Right); and Inguinal hernia repair (Left, 04/03/2020).   His family history includes Diabetes in his mother; Heart attack in his father.He reports that he has never smoked. He has never used smokeless tobacco. He reports that he does not drink alcohol and does not use drugs.  Current Outpatient Medications on File Prior to Visit  Medication Sig Dispense Refill  . aspirin (ASPIRIN LOW DOSE) 81 MG EC tablet Take 81 mg by mouth daily.      [name] atorvastatin (LIPITOR) 80 MG tablet TAKE 1/2 TABLET BY MOUTH EVERY DAY AT BEDTIME 45 tablet 1  . azelastine (ASTELIN) 0.1 % nasal spray 2 sprays each nostril at bedtime (Patient taking differently: Place 2 sprays into both nostrils at bedtime as needed (congestion). ) 90 mL 3  . Blood Glucose Monitoring Suppl (ONETOUCH VERIO) w/Device KIT 1 Stick by Does not apply route 2 (two) times daily. 1 kit 0  . Cholecalciferol (VITAMIN D3) 5000 UNITS Mathis Take 5,000 Units by mouth daily.     [name] glucose blood (ONE TOUCH ULTRA TEST) test strip Test bid, Dx  E11.9 300 each 3  . Glycerin-Hypromellose-PEG 400 (VISINE DRY EYE) 0.2-0.2-1 % SOLN Place 1-2 drops into both eyes 4 (four) times daily as needed.    Peter Mathis Take 1,000 mg by mouth daily.    . Multiple Vitamin (MULTIVITAMIN WITH MINERALS) TABS tablet Take 1 tablet by mouth daily.    . fluticasone (FLONASE) 50 MCG/ACT nasal spray SPRAY 2 SPRAYS INTO EACH NOSTRIL EVERY DAY (Patient not taking: Reported on 10/28/2020) 48 mL 1  . ondansetron (ZOFRAN ODT) 4 MG disintegrating tablet Take 1 tablet (4 mg total) by mouth every 8 (eight) hours as needed for nausea or vomiting. (Patient not taking: Reported on 06/05/2020) 20 tablet 0   No current facility-administered medications on file prior to visit.    ROS Review of Systems  Constitutional: Negative.   HENT: Negative.   Respiratory: Negative for cough and shortness of breath.   Cardiovascular: Negative for chest pain and leg swelling.  Gastrointestinal: Negative for abdominal pain, diarrhea, nausea and vomiting.  Genitourinary: Positive for dysuria.  Musculoskeletal: Negative for arthralgias and myalgias.  Skin: Negative for rash.  Psychiatric/Behavioral: Negative for sleep disturbance.    Objective:  BP 139/75   Pulse 77   Temp 97.6 F (36.4 C) (Temporal)   Resp 20   Ht '5\' 11"'  (1.803 m)   Wt 174 lb 8 oz (79.2 kg)   SpO2 99%   BMI 24.34 kg/m   BP Readings from Last 3 Encounters:  10/28/20 139/75  06/05/20 137/77  04/30/20 (!) 163/75  Wt Readings from Last 3 Encounters:  10/28/20 174 lb 8 oz (79.2 kg)  06/05/20 172 lb 8 oz (78.2 kg)  04/30/20 172 lb (78 kg)     Physical Exam Vitals reviewed.  Constitutional:      Appearance: He is well-developed.  HENT:     Head: Normocephalic and atraumatic.     Right Ear: Tympanic membrane and external ear normal. No decreased hearing noted.     Left Ear: Tympanic membrane and external ear normal. No decreased hearing noted.     Mouth/Throat:     Pharynx: No  oropharyngeal exudate or posterior oropharyngeal erythema.  Eyes:     Pupils: Pupils are equal, round, and reactive to light.  Cardiovascular:     Rate and Rhythm: Normal rate and regular rhythm.     Heart sounds: No murmur heard.   Pulmonary:     Effort: No respiratory distress.     Breath sounds: Normal breath sounds.  Abdominal:     General: Bowel sounds are normal.     Palpations: Abdomen is soft. There is no mass.     Tenderness: There is no abdominal tenderness.  Musculoskeletal:     Cervical back: Normal range of motion and neck supple.        Assessment & Plan:   Skylor was seen today for medical management of chronic issues.  Diagnoses and all orders for this visit:  Hyperlipidemia associated with type 2 diabetes mellitus ([place]) -     Bayer DCA Hb A1c Waived -     Microalbumin / creatinine urine ratio -     CBC with Differential/Platelet -     CMP14+EGFR -     Lipid panel  Type 2 diabetes mellitus without complication, without long-term current use of insulin (HCC) -     Bayer DCA Hb A1c Waived -     Microalbumin / creatinine urine ratio -     CBC with Differential/Platelet -     CMP14+EGFR -     Lipid panel  Need for immunization against influenza -     Flu Vaccine QUAD High Dose(Fluad)  Other orders -     sitaGLIPtin (JANUVIA) 100 MG tablet; Take 1 tablet (100 mg total) by mouth daily. -     lisinopril (ZESTRIL) 5 MG tablet; Take 1 tablet (5 mg total) by mouth daily.      I have changed Kaysin R. Cilento's Januvia to sitaGLIPtin. I have also changed his lisinopril. I am also having him maintain his aspirin, Vitamin D3, glucose blood, OneTouch Verio, azelastine, Krill Mathis, fluticasone, ondansetron, multivitamin with minerals, Visine Dry Eye, and atorvastatin.  Meds ordered this encounter  Medications  . sitaGLIPtin (JANUVIA) 100 MG tablet    Sig: Take 1 tablet (100 mg total) by mouth daily.    Dispense:  90 tablet    Refill:  1  . lisinopril  (ZESTRIL) 5 MG tablet    Sig: Take 1 tablet (5 mg total) by mouth daily.    Dispense:  90 tablet    Refill:  1     Follow-up: No follow-ups on file.  [name], M.D.

## 2020-10-29 LAB — MICROALBUMIN / CREATININE URINE RATIO
Creatinine, Urine: 136.2 mg/dL
Microalb/Creat Ratio: 5 mg/g creat (ref 0–29)
Microalbumin, Urine: 6.9 ug/mL

## 2020-10-29 NOTE — Progress Notes (Signed)
Hello Wolfgang,  Your lab result is normal and/or stable.Some minor variations that are not significant are commonly marked abnormal, but do not represent any medical problem for you.  Best regards, Donielle Kaigler, M.D.

## 2020-11-20 ENCOUNTER — Ambulatory Visit: Payer: PPO | Admitting: Licensed Clinical Social Worker

## 2020-11-20 DIAGNOSIS — E11319 Type 2 diabetes mellitus with unspecified diabetic retinopathy without macular edema: Secondary | ICD-10-CM

## 2020-11-20 DIAGNOSIS — E785 Hyperlipidemia, unspecified: Secondary | ICD-10-CM

## 2020-11-20 DIAGNOSIS — N400 Enlarged prostate without lower urinary tract symptoms: Secondary | ICD-10-CM

## 2020-11-20 DIAGNOSIS — E1169 Type 2 diabetes mellitus with other specified complication: Secondary | ICD-10-CM

## 2020-11-20 DIAGNOSIS — E119 Type 2 diabetes mellitus without complications: Secondary | ICD-10-CM

## 2020-11-20 DIAGNOSIS — E559 Vitamin D deficiency, unspecified: Secondary | ICD-10-CM

## 2020-11-20 NOTE — Chronic Care Management (AMB) (Signed)
Chronic Care Management    Clinical Social Work Follow Up Note  11/20/2020 Name: Peter Mathis MRN: [id number] DOB: 10/05/1951  Peter Mathis is a 69 y.o. year old male who is a primary care patient of Stacks, Cletus Gash, MD. The CCM team was consulted for assistance with [name] .   Review of patient status, including review of consultants reports, other relevant assessments, and collaboration with appropriate care team members and the patient's provider was performed as part of comprehensive patient evaluation and provision of chronic care management services.    SDOH (Social Determinants of Health) assessments performed: No; risk for depression; risk for tobacco use; risk for stress; risk for physical inactivity    Chronic Care Management from 06/04/2020 in [place]  PHQ-9 Total Score 2     GAD 7 : Generalized Anxiety Score 06/04/2020  Nervous, Anxious, on Edge 1  Control/stop worrying 0  Worry too much - different things 0  Trouble relaxing 0  Restless 0  Easily annoyed or irritable 0  Afraid - awful might happen 0  Total GAD 7 Score 1  Anxiety Difficulty Somewhat difficult    Outpatient Encounter Medications as of 11/20/2020  Medication Sig Note  . aspirin (ASPIRIN LOW DOSE) 81 MG EC tablet Take 81 mg by mouth daily.     [name] atorvastatin (LIPITOR) 80 MG tablet TAKE 1/2 TABLET BY MOUTH EVERY DAY AT BEDTIME   . azelastine (ASTELIN) 0.1 % nasal spray 2 sprays each nostril at bedtime (Patient taking differently: Place 2 sprays into both nostrils at bedtime as needed (congestion). ) 04/11/2020: Patient has if needed but has not had to take recently  . Blood Glucose Monitoring Suppl (ONETOUCH VERIO) w/Device KIT 1 Stick by Does not apply route 2 (two) times daily.   . Cholecalciferol (VITAMIN D3) 5000 UNITS CAPS Take 5,000 Units by mouth daily.    . fluticasone (FLONASE) 50 MCG/ACT nasal spray SPRAY 2 SPRAYS INTO EACH NOSTRIL EVERY DAY (Patient not  taking: Reported on 10/28/2020) 04/11/2020: Patient has if needed but has not had to take recently  . glucose blood (ONE TOUCH ULTRA TEST) test strip Test bid, Dx E11.9   . Glycerin-Hypromellose-PEG 400 (VISINE DRY EYE) 0.2-0.2-1 % SOLN Place 1-2 drops into both eyes 4 (four) times daily as needed.   Javier Docker Oil 1000 MG CAPS Take 1,000 mg by mouth daily.   [name] lisinopril (ZESTRIL) 5 MG tablet Take 1 tablet (5 mg total) by mouth daily.   . metFORMIN (GLUCOPHAGE) 500 MG tablet Take 2 tablets (1,000 mg total) by mouth 2 (two) times daily with a meal.   . Multiple Vitamin (MULTIVITAMIN WITH MINERALS) TABS tablet Take 1 tablet by mouth daily.   . ondansetron (ZOFRAN ODT) 4 MG disintegrating tablet Take 1 tablet (4 mg total) by mouth every 8 (eight) hours as needed for nausea or vomiting. (Patient not taking: Reported on 06/05/2020) 04/11/2020: Patient has if needed but has not had to take recently  . sitaGLIPtin (JANUVIA) 100 MG tablet Take 1 tablet (100 mg total) by mouth daily.    No facility-administered encounter medications on file as of 11/20/2020.    LCSW called phone numbers for client today but LCSW was unable to reach client via phone today; LCSW did leave phone message requesting client return call to LCSW ata [phone]. LCSW also called phone number for spouse of client today but LCSW was not able to speak with spouse of client via phone today. LCSW left  phone message for spouse of client to please return call to LCSW at [phone]  Follow Up Plan: LCSW to call client/spouse of clientin next 4 weeks to talk with client/spouseabout vision challenges of client and about client completion of ADLs  [name].Jeilani Grupe MSW, LCSW Licensed Clinical Social Worker [facility] Care Management [phone]

## 2020-11-20 NOTE — Patient Instructions (Addendum)
Licensed Clinical Social Worker Visit Information  Materials Provided: No  11/20/2020  Name: Peter Mathis          MRN: [id number]       DOB: 1951/11/03  Peter Mathis is a 69 y.o. year old male who is a primary care patient of Stacks, Cletus Gash, MD. The CCM team was consulted for assistance with [name] .   Review of patient status, including review of consultants reports, other relevant assessments, and collaboration with appropriate care team members and the patient's provider was performed as part of comprehensive patient evaluation and provision of chronic care management services.    SDOH (Social Determinants of Health) assessments performed: No; risk for depression; risk for tobacco use; risk for stress; risk for physical inactivity  LCSW called phone numbers for client today but LCSW was unable to reach client via phone today; LCSW did leave phone message requesting client return call to LCSW at [phone]. LCSW also called phone number for spouse of client today but LCSW was not able to speak with spouse of client via phone today. LCSW left phone message for spouse of client to please return call to LCSW at [phone]  Follow Up Plan: LCSW to call client/spouse of clientin next 4 weeks to talk with client/spouseabout vision challenges of client and about client completion of ADLs   LCSW was not able to speak via phone today with client or spouse of client; thus, client or spouse were not able to verbalize understanding of instructions provided today and were not able to accept or decline a print copy of patient instruction materials.   [name].[name] MSW, LCSW Licensed Clinical Social Worker [facility] Care Management [phone]

## 2020-11-24 ENCOUNTER — Other Ambulatory Visit: Payer: Self-pay | Admitting: Family Medicine

## 2020-12-30 ENCOUNTER — Ambulatory Visit: Payer: PPO | Admitting: Licensed Clinical Social Worker

## 2020-12-30 DIAGNOSIS — E11319 Type 2 diabetes mellitus with unspecified diabetic retinopathy without macular edema: Secondary | ICD-10-CM

## 2020-12-30 DIAGNOSIS — E559 Vitamin D deficiency, unspecified: Secondary | ICD-10-CM

## 2020-12-30 DIAGNOSIS — N400 Enlarged prostate without lower urinary tract symptoms: Secondary | ICD-10-CM

## 2020-12-30 DIAGNOSIS — E785 Hyperlipidemia, unspecified: Secondary | ICD-10-CM

## 2020-12-30 NOTE — Patient Instructions (Addendum)
Licensed Clinical Social Worker Visit Information  Goals we discussed today:  .  Client wil talk with LCSW in next 4 weeks to discuss vision challenges of client and client completion of ADLs (pt-stated)        CARE PLAN ENTRY   Current Barriers:   Type 2 DM in client with chronic diagnoses of BPH, Vitamin D deficiency, Diabetic Retinopathy, HLD  Vision challenges  Clinical Social Work Clinical Goal(s):   LCSW to call client in next 4 weeks to talk with client about vision challlenges of client and client completion of ADLs  Interventions   Talked with client previously about CCM program services Talked with client about pain issues of client Talked with client about current needs of client  Patient Self Care Activities:  Attends scheduled medical appointments Completes ADLs independently  Drives to appointments and to complete errands needed  Patient Self Care Deficits:   Vision challenges  Management of Type 2 DM   Initial goal documentation     Follow Up Plan: LCSW to call client/spouse of clientin next 4 weeks to talk with client/spouseabout vision challenges of client and about client completion of ADLs  Materials Provided: No  The patient verbalized understanding of instructions provided today and declined a print copy of patient instruction materials.   Kelton Pillar.Collen Vincent MSW, LCSW Licensed Clinical Social Worker [facility] Care Management [phone]

## 2020-12-30 NOTE — Chronic Care Management (AMB) (Signed)
Chronic Care Management    Clinical Social Work Follow Up Note  12/30/2020 Name: Peter Mathis MRN: [id number] DOB: [birth year]  Peter Mathis is a 70 y.o. year old male who is a primary care patient of Stacks, Cletus Gash, MD. The CCM team was consulted for assistance with [name] .   Review of patient status, including review of consultants reports, other relevant assessments, and collaboration with appropriate care team members and the patient's provider was performed as part of comprehensive patient evaluation and provision of chronic care management services.    SDOH (Social Determinants of Health) assessments performed: No; risk for tobacco use; risk for depression; risk for stress; risk for physical inactivity  Flowsheet Row Chronic Care Management from 06/04/2020 in [facility]  PHQ-9 Total Score 2      GAD 7 : Generalized Anxiety Score 06/04/2020  Nervous, Anxious, on Edge 1  Control/stop worrying 0  Worry too much - different things 0  Trouble relaxing 0  Restless 0  Easily annoyed or irritable 0  Afraid - awful might happen 0  Total GAD 7 Score 1  Anxiety Difficulty Somewhat difficult    Outpatient Encounter Medications as of 12/30/2020  Medication Sig Note  . aspirin (ASPIRIN LOW DOSE) 81 MG EC tablet Take 81 mg by mouth daily.     [name] atorvastatin (LIPITOR) 80 MG tablet TAKE 1/2 TABLET BY MOUTH EVERY DAY AT BEDTIME   . azelastine (ASTELIN) 0.1 % nasal spray 2 sprays each nostril at bedtime (Patient taking differently: Place 2 sprays into both nostrils at bedtime as needed (congestion). ) 04/11/2020: Patient has if needed but has not had to take recently  . Blood Glucose Monitoring Suppl (ONETOUCH VERIO) w/Device KIT 1 Stick by Does not apply route 2 (two) times daily.   . Cholecalciferol (VITAMIN D3) 5000 UNITS CAPS Take 5,000 Units by mouth daily.    . fluticasone (FLONASE) 50 MCG/ACT nasal spray SPRAY 2 SPRAYS INTO EACH NOSTRIL EVERY DAY  (Patient not taking: Reported on 10/28/2020) 04/11/2020: Patient has if needed but has not had to take recently  . glucose blood (ONE TOUCH ULTRA TEST) test strip Test bid, Dx E11.9   . Glycerin-Hypromellose-PEG 400 (VISINE DRY EYE) 0.2-0.2-1 % SOLN Place 1-2 drops into both eyes 4 (four) times daily as needed.   Javier Docker Oil 1000 MG CAPS Take 1,000 mg by mouth daily.   [name] lisinopril (ZESTRIL) 5 MG tablet Take 1 tablet (5 mg total) by mouth daily.   . metFORMIN (GLUCOPHAGE) 500 MG tablet Take 2 tablets (1,000 mg total) by mouth 2 (two) times daily with a meal.   . Multiple Vitamin (MULTIVITAMIN WITH MINERALS) TABS tablet Take 1 tablet by mouth daily.   . ondansetron (ZOFRAN ODT) 4 MG disintegrating tablet Take 1 tablet (4 mg total) by mouth every 8 (eight) hours as needed for nausea or vomiting. (Patient not taking: Reported on 06/05/2020) 04/11/2020: Patient has if needed but has not had to take recently  . sitaGLIPtin (JANUVIA) 100 MG tablet Take 1 tablet (100 mg total) by mouth daily.    No facility-administered encounter medications on file as of 12/30/2020.    Goals    .  Client wil talk with LCSW in next 4 weeks to discuss vision challenges of client and client completion of ADLs (pt-stated)      CARE PLAN ENTRY   Current Barriers:  . Type 2 DM in client with chronic diagnoses of BPH, Vitamin D deficiency, Diabetic  Retinopathy, HLD . Vision challenges  Clinical Social Work Clinical Goal(s):  [name] LCSW to call client in next 4 weeks to talk with client about vision challlenges of client and client completion of ADLs  Interventions   Talked with client previously about CCM program services Talked with client about pain issues of client Talked with client about current needs of client  Patient Self Care Activities:  Attends scheduled medical appointments Completes ADLs independently  Drives to appointments and to complete errands needed  Patient Self Care Deficits:  [name] Vision  challenges . Management of Type 2 DM   Initial goal documentation     Follow Up Plan: LCSW to call client/spouse of clientin next 4 weeks to talk with client/spouseabout vision challenges of client and about client completion of ADLs  [name].Mikesha Migliaccio MSW, LCSW Licensed Clinical Social Worker [facility] Care Management [phone]

## 2021-01-28 ENCOUNTER — Encounter: Payer: Self-pay | Admitting: Family Medicine

## 2021-01-28 ENCOUNTER — Other Ambulatory Visit: Payer: Self-pay

## 2021-01-28 ENCOUNTER — Ambulatory Visit (INDEPENDENT_AMBULATORY_CARE_PROVIDER_SITE_OTHER): Payer: PPO | Admitting: Family Medicine

## 2021-01-28 VITALS — BP 136/62 | HR 75 | Temp 97.9°F | Ht 71.0 in | Wt 170.8 lb

## 2021-01-28 DIAGNOSIS — E785 Hyperlipidemia, unspecified: Secondary | ICD-10-CM

## 2021-01-28 DIAGNOSIS — N400 Enlarged prostate without lower urinary tract symptoms: Secondary | ICD-10-CM

## 2021-01-28 DIAGNOSIS — E119 Type 2 diabetes mellitus without complications: Secondary | ICD-10-CM | POA: Diagnosis not present

## 2021-01-28 DIAGNOSIS — N521 Erectile dysfunction due to diseases classified elsewhere: Secondary | ICD-10-CM

## 2021-01-28 DIAGNOSIS — E11319 Type 2 diabetes mellitus with unspecified diabetic retinopathy without macular edema: Secondary | ICD-10-CM

## 2021-01-28 DIAGNOSIS — E1169 Type 2 diabetes mellitus with other specified complication: Secondary | ICD-10-CM | POA: Diagnosis not present

## 2021-01-28 DIAGNOSIS — E559 Vitamin D deficiency, unspecified: Secondary | ICD-10-CM | POA: Diagnosis not present

## 2021-01-28 DIAGNOSIS — N5201 Erectile dysfunction due to arterial insufficiency: Secondary | ICD-10-CM

## 2021-01-28 LAB — BAYER DCA HB A1C WAIVED: HB A1C (BAYER DCA - WAIVED): 7.7 % — ABNORMAL HIGH (ref <7.0)

## 2021-01-28 NOTE — Progress Notes (Signed)
Subjective:  Patient ID: Peter Mathis,  male    DOB: [birth year]  Age: 70 y.o.    CC: Medical Management of Chronic Issues   HPI Peter Mathis presents for follow-up of elevated cholesterol. Doing well without complaints on current medication. Denies side effects  including myalgia and arthralgia and nausea. Also in today for liver function testing. Currently no chest pain, shortness of breath or other cardiovascular related symptoms noted.  Follow-up of diabetes. Patient does check blood sugar at home. Readings run between 120 and 160 Patient denies symptoms such as excessive hunger or urinary frequency, excessive hunger, nausea No significant hypoglycemic spells noted. Medications reviewed. Pt reports taking them regularly. Pt. denies complication/adverse reaction today.    History Peter Mathis has a past medical history of Chest discomfort (1989), Diverticulosis, DM type 2 (diabetes mellitus, type 2) ([place]), Heart murmur, History of kidney stones, and Hyperlipidemia.   He has a past surgical history that includes Tonsillectomy and adenoidectomy (01/1955); Ganglion cyst excision (Right); and Inguinal hernia repair (Left, 04/03/2020).   His family history includes Diabetes in his mother; Heart attack in his father.He reports that he has never smoked. He has never used smokeless tobacco. He reports that he does not drink alcohol and does not use drugs.  Current Outpatient Medications on File Prior to Visit  Medication Sig Dispense Refill  . aspirin 81 MG EC tablet Take 81 mg by mouth daily.    [name] atorvastatin (LIPITOR) 80 MG tablet TAKE 1/2 TABLET BY MOUTH EVERY DAY AT BEDTIME 45 tablet 1  . Blood Glucose Monitoring Suppl (ONETOUCH VERIO) w/Device KIT 1 Stick by Does not apply route 2 (two) times daily. 1 kit 0  . Cholecalciferol (VITAMIN D3) 5000 UNITS CAPS Take 5,000 Units by mouth daily.    . Glycerin-Hypromellose-PEG 400 (VISINE DRY EYE) 0.2-0.2-1 % SOLN Place 1-2 drops into both  eyes 4 (four) times daily as needed.    Peter Mathis Oil 1000 MG CAPS Take 1,000 mg by mouth daily.    [name] lisinopril (ZESTRIL) 5 MG tablet Take 1 tablet (5 mg total) by mouth daily. 90 tablet 1  . metFORMIN (GLUCOPHAGE) 500 MG tablet Take 2 tablets (1,000 mg total) by mouth 2 (two) times daily with a meal. 360 tablet 3  . Multiple Vitamin (MULTIVITAMIN WITH MINERALS) TABS tablet Take 1 tablet by mouth daily.    . sitaGLIPtin (JANUVIA) 100 MG tablet Take 1 tablet (100 mg total) by mouth daily. 90 tablet 1   No current facility-administered medications on file prior to visit.    ROS Review of Systems  Constitutional: Negative.   HENT: Negative.   Eyes: Negative for visual disturbance.  Respiratory: Negative for cough and shortness of breath.   Cardiovascular: Negative for chest pain and leg swelling.  Gastrointestinal: Negative for abdominal pain, diarrhea, nausea and vomiting.  Genitourinary: Negative for difficulty urinating and frequency.  Musculoskeletal: Negative for arthralgias and myalgias.  Skin: Negative for rash.  Neurological: Negative for headaches.  Psychiatric/Behavioral: Negative for sleep disturbance.    Objective:  BP 136/62   Pulse 75   Temp 97.9 F (36.6 C)   Ht '5\' 11"'  (1.803 m)   Wt 170 lb 12.8 oz (77.5 kg)   BMI 23.82 kg/m   BP Readings from Last 3 Encounters:  01/28/21 136/62  10/28/20 139/75  06/05/20 137/77    Wt Readings from Last 3 Encounters:  01/28/21 170 lb 12.8 oz (77.5 kg)  10/28/20 174 lb 8 oz (79.2 kg)  06/05/20 172 lb 8 oz (78.2 kg)     Physical Exam  Diabetic Foot Exam - Simple   No data filed       Assessment & Plan:   [name] was seen today for medical management of chronic issues.  Diagnoses and all orders for this visit:  Type 2 diabetes mellitus without complication, without long-term current use of insulin (HCC) -     CBC with Differential/Platelet -     CMP14+EGFR -     Lipid panel -     Cancel: Bayer DCA Hb A1c  Waived -     Bayer DCA Hb A1c Waived  Hyperlipidemia associated with type 2 diabetes mellitus (HCC) -     CBC with Differential/Platelet -     CMP14+EGFR -     Lipid panel -     Cancel: Bayer DCA Hb A1c Waived -     Bayer DCA Hb A1c Waived  Vitamin D deficiency -     CBC with Differential/Platelet -     CMP14+EGFR  Benign prostatic hyperplasia without lower urinary tract symptoms -     CBC with Differential/Platelet -     CMP14+EGFR -     PSA, total and free  Erectile dysfunction due to arterial insufficiency -     Testosterone,Free and Total  Erectile dysfunction due to type 2 diabetes mellitus ([place])   I have discontinued Jeramiah R. Klepper's glucose blood, azelastine, fluticasone, and ondansetron. I am also having him maintain his aspirin, Vitamin D3, OneTouch Verio, Krill Oil, multivitamin with minerals, Visine Dry Eye, metFORMIN, sitaGLIPtin, lisinopril, and atorvastatin.  No orders of the defined types were placed in this encounter.    Follow-up: Return in about 3 months (around 04/27/2021).  [name], M.D.

## 2021-01-29 LAB — PSA, TOTAL AND FREE
PSA, Free Pct: 49 %
PSA, Free: 0.49 ng/mL
Prostate Specific Ag, Serum: 1 ng/mL (ref 0.0–4.0)

## 2021-01-29 LAB — CMP14+EGFR
ALT: 19 IU/L (ref 0–44)
AST: 17 IU/L (ref 0–40)
Albumin/Globulin Ratio: 1.7 (ref 1.2–2.2)
Albumin: 4.3 g/dL (ref 3.8–4.8)
Alkaline Phosphatase: 50 IU/L (ref 44–121)
BUN/Creatinine Ratio: 17 (ref 10–24)
BUN: 17 mg/dL (ref 8–27)
Bilirubin Total: 0.8 mg/dL (ref 0.0–1.2)
CO2: 24 mmol/L (ref 20–29)
Calcium: 9.4 mg/dL (ref 8.6–10.2)
Chloride: 100 mmol/L (ref 96–106)
Creatinine, Ser: 1.03 mg/dL (ref 0.76–1.27)
GFR calc Af Amer: 85 mL/min/{1.73_m2} (ref >59)
GFR calc non Af Amer: 73 mL/min/{1.73_m2} (ref >59)
Globulin, Total: 2.5 g/dL (ref 1.5–4.5)
Glucose: 172 mg/dL — ABNORMAL HIGH (ref 65–99)
Potassium: 4.6 mmol/L (ref 3.5–5.2)
Sodium: 137 mmol/L (ref 134–144)
Total Protein: 6.8 g/dL (ref 6.0–8.5)

## 2021-01-29 LAB — CBC WITH DIFFERENTIAL/PLATELET
Basophils Absolute: 0 10*3/uL (ref 0.0–0.2)
Basos: 1 %
EOS (ABSOLUTE): 0.3 10*3/uL (ref 0.0–0.4)
Eos: 4 %
Hematocrit: 44.3 % (ref 37.5–51.0)
Hemoglobin: 14.7 g/dL (ref 13.0–17.7)
Immature Grans (Abs): 0 10*3/uL (ref 0.0–0.1)
Immature Granulocytes: 0 %
Lymphocytes Absolute: 2.6 10*3/uL (ref 0.7–3.1)
Lymphs: 32 %
MCH: 28.9 pg (ref 26.6–33.0)
MCHC: 33.2 g/dL (ref 31.5–35.7)
MCV: 87 fL (ref 79–97)
Monocytes Absolute: 0.6 10*3/uL (ref 0.1–0.9)
Monocytes: 8 %
Neutrophils Absolute: 4.6 10*3/uL (ref 1.4–7.0)
Neutrophils: 55 %
Platelets: 285 10*3/uL (ref 150–450)
RBC: 5.09 x10E6/uL (ref 4.14–5.80)
RDW: 12 % (ref 11.6–15.4)
WBC: 8.2 10*3/uL (ref 3.4–10.8)

## 2021-01-29 LAB — TESTOSTERONE,FREE AND TOTAL
Testosterone, Free: 7.6 pg/mL (ref 6.6–18.1)
Testosterone: 630 ng/dL (ref 264–916)

## 2021-01-29 LAB — LIPID PANEL
Chol/HDL Ratio: 2.6 ratio (ref 0.0–5.0)
Cholesterol, Total: 97 mg/dL — ABNORMAL LOW (ref 100–199)
HDL: 37 mg/dL — ABNORMAL LOW (ref >39)
LDL Chol Calc (NIH): 42 mg/dL (ref 0–99)
Triglycerides: 91 mg/dL (ref 0–149)
VLDL Cholesterol Cal: 18 mg/dL (ref 5–40)

## 2021-01-30 ENCOUNTER — Ambulatory Visit: Payer: PPO | Admitting: Licensed Clinical Social Worker

## 2021-01-30 DIAGNOSIS — E11319 Type 2 diabetes mellitus with unspecified diabetic retinopathy without macular edema: Secondary | ICD-10-CM

## 2021-01-30 DIAGNOSIS — N400 Enlarged prostate without lower urinary tract symptoms: Secondary | ICD-10-CM

## 2021-01-30 DIAGNOSIS — E559 Vitamin D deficiency, unspecified: Secondary | ICD-10-CM

## 2021-01-30 DIAGNOSIS — E785 Hyperlipidemia, unspecified: Secondary | ICD-10-CM

## 2021-01-30 DIAGNOSIS — E1169 Type 2 diabetes mellitus with other specified complication: Secondary | ICD-10-CM

## 2021-01-30 NOTE — Chronic Care Management (AMB) (Signed)
  Chronic Care Management    Clinical Social Work Follow Up Note  01/30/2021 Name: Peter Mathis MRN: [id number] DOB: 1951-01-13  Peter Mathis is a 70 y.o. year old male who is a primary care patient of Stacks, Cletus Gash, MD. The CCM team was consulted for assistance with [name] .   Review of patient status, including review of consultants reports, other relevant assessments, and collaboration with appropriate care team members and the patient's provider was performed as part of comprehensive patient evaluation and provision of chronic care management services.    SDOH (Social Determinants of Health) assessments performed: No; risk for depression; risk for tobacco use;risk for stress; risk for physical inactivity  Flowsheet Row Chronic Care Management from 06/04/2020 in [facility]  PHQ-9 Total Score 2     GAD 7 : Generalized Anxiety Score 06/04/2020  Nervous, Anxious, on Edge 1  Control/stop worrying 0  Worry too much - different things 0  Trouble relaxing 0  Restless 0  Easily annoyed or irritable 0  Afraid - awful might happen 0  Total GAD 7 Score 1  Anxiety Difficulty Somewhat difficult    Outpatient Encounter Medications as of 01/30/2021  Medication Sig  . aspirin 81 MG EC tablet Take 81 mg by mouth daily.  [name] atorvastatin (LIPITOR) 80 MG tablet TAKE 1/2 TABLET BY MOUTH EVERY DAY AT BEDTIME  . Blood Glucose Monitoring Suppl (ONETOUCH VERIO) w/Device KIT 1 Stick by Does not apply route 2 (two) times daily.  . Cholecalciferol (VITAMIN D3) 5000 UNITS CAPS Take 5,000 Units by mouth daily.  . Glycerin-Hypromellose-PEG 400 (VISINE DRY EYE) 0.2-0.2-1 % SOLN Place 1-2 drops into both eyes 4 (four) times daily as needed.  Javier Docker Oil 1000 MG CAPS Take 1,000 mg by mouth daily.  [name] lisinopril (ZESTRIL) 5 MG tablet Take 1 tablet (5 mg total) by mouth daily.  . metFORMIN (GLUCOPHAGE) 500 MG tablet Take 2 tablets (1,000 mg total) by mouth 2 (two) times daily  with a meal.  . Multiple Vitamin (MULTIVITAMIN WITH MINERALS) TABS tablet Take 1 tablet by mouth daily.  . sitaGLIPtin (JANUVIA) 100 MG tablet Take 1 tablet (100 mg total) by mouth daily.   No facility-administered encounter medications on file as of 01/30/2021.    Goals    .  Client wil talk with LCSW in next 4 weeks to discuss vision challenges of client and client completion of ADLs (pt-stated)      CARE PLAN ENTRY   Current Barriers:  . Type 2 DM in client with chronic diagnoses of BPH, Vitamin D deficiency, Diabetic Retinopathy, HLD . Vision challenges  Clinical Social Work Clinical Goal(s):  [name] LCSW to call client in next 4 weeks to talk with client about vision challlenges of client and client completion of ADLs  Interventions  Talked with client about needs of client Talked with client about his recent appointment with Dr. [name]  Patient Self Care Activities:  Attends scheduled medical appointments Completes ADLs independently  Drives to appointments and to complete errands needed  Patient Self Care Deficits:  [name] Vision challenges . Management of Type 2 DM   Initial goal documentation     Follow Up Plan: LCSW to call client/spouse of clientin next 4 weeks to talk with client/spouseabout vision challenges of client and about client completion of ADLs  [name].Cydnie Deason MSW, LCSW Licensed Clinical Social Worker [facility] Care [facility] [phone]

## 2021-01-30 NOTE — Patient Instructions (Addendum)
Licensed Clinical Social Worker Visit Information  Goals we discussed today:   .  Client wil talk with LCSW in next 4 weeks to discuss vision challenges of client and client completion of ADLs (pt-stated)        CARE PLAN ENTRY   Current Barriers:   Type 2 DM in client with chronic diagnoses of BPH, Vitamin D deficiency, Diabetic Retinopathy, HLD  Vision challenges  Clinical Social Work Clinical Goal(s):   LCSW to call client in next 4 weeks to talk with client about vision challlenges of client and client completion of ADLs  Interventions  Talked with client about needs of client Talked with client about his recent appointment with Dr. [name]  Patient Self Care Activities:  Attends scheduled medical appointments Completes ADLs independently  Drives to appointments and to complete errands needed  Patient Self Care Deficits:   Vision challenges  Management of Type 2 DM   Initial goal documentation     Follow Up Plan:LCSW to call client/spouse of clientin next 4 weeks to talk with client/spouseabout vision challenges of client and about client completion of ADLs  Materials Provided: No  The patient verbalized understanding of instructions provided today and declined a print copy of patient instruction materials.   [name].Niemah Schwebke MSW, LCSW Licensed Clinical Social Worker [facility] Care Management [phone]

## 2021-02-01 NOTE — Progress Notes (Signed)
Hello [facility],  Your lab result is normal and/or stable.Some minor variations that are not significant are commonly marked abnormal, but do not represent any medical problem for you.  Best regards, Marvena Tally, M.D.

## 2021-02-13 ENCOUNTER — Other Ambulatory Visit: Payer: Self-pay | Admitting: Family Medicine

## 2021-02-14 ENCOUNTER — Telehealth: Payer: Self-pay

## 2021-02-14 MED ORDER — SITAGLIPTIN PHOSPHATE 100 MG PO TABS: 100.0000 mg | ORAL_TABLET | Freq: Every day | ORAL | 1 refills | Status: DC

## 2021-02-14 NOTE — Telephone Encounter (Signed)
  Prescription Request  02/14/2021  What is the name of the medication or equipment? Pt said his Celesta Gentile was sent in for 30 days and he needs it sent in for 90 days for insurance to cover  Have you contacted your pharmacy to request a refill? (if applicable) yes  Which pharmacy would you like this sent to? [facility]   Patient notified that their request is being sent to the clinical staff for review and that they should receive a response within 2 business days.

## 2021-02-14 NOTE — Telephone Encounter (Signed)
Resent to pharmacy for #90 and patient notified and verbalized understanding

## 2021-03-03 DIAGNOSIS — M25561 Pain in right knee: Secondary | ICD-10-CM | POA: Diagnosis not present

## 2021-03-04 ENCOUNTER — Telehealth: Payer: PPO

## 2021-04-03 ENCOUNTER — Other Ambulatory Visit: Payer: Self-pay | Admitting: *Deleted

## 2021-04-03 ENCOUNTER — Telehealth: Payer: PPO

## 2021-04-03 DIAGNOSIS — E1169 Type 2 diabetes mellitus with other specified complication: Secondary | ICD-10-CM

## 2021-04-07 ENCOUNTER — Other Ambulatory Visit: Payer: Self-pay

## 2021-04-07 MED ORDER — SILDENAFIL CITRATE 20 MG PO TABS: 20.0000 mg | ORAL_TABLET | ORAL | 2 refills | Status: DC | PRN

## 2021-04-07 NOTE — Telephone Encounter (Signed)
Last office visit 01/28/21 Last refill 04/11/20, #50, 3 refills

## 2021-04-28 ENCOUNTER — Ambulatory Visit (INDEPENDENT_AMBULATORY_CARE_PROVIDER_SITE_OTHER): Payer: PPO | Admitting: Nurse Practitioner

## 2021-04-28 DIAGNOSIS — J028 Acute pharyngitis due to other specified organisms: Secondary | ICD-10-CM

## 2021-04-28 DIAGNOSIS — U071 COVID-19: Secondary | ICD-10-CM

## 2021-04-28 DIAGNOSIS — J029 Acute pharyngitis, unspecified: Secondary | ICD-10-CM | POA: Insufficient documentation

## 2021-04-28 MED ORDER — FLUTICASONE PROPIONATE 50 MCG/ACT NA SUSP: 2.0000 | Freq: Every day | NASAL | 6 refills | Status: AC

## 2021-04-28 MED ORDER — DOXYCYCLINE HYCLATE 100 MG PO TABS: 100.0000 mg | ORAL_TABLET | Freq: Two times a day (BID) | ORAL | 0 refills | Status: DC

## 2021-04-28 NOTE — Assessment & Plan Note (Signed)
Instructions given to patient for symptom management.  Completed infusion center orders.  Advised patient to monitor oxygen saturation to be above 91%.  Follow-up with worsening or unresolved symptoms.

## 2021-04-28 NOTE — Progress Notes (Signed)
   Virtual Visit  Note Due to COVID-19 pandemic this visit was conducted virtually. This visit type was conducted due to national recommendations for restrictions regarding the COVID-19 Pandemic (e.g. social distancing, sheltering in place) in an effort to limit this patient's exposure and mitigate transmission in our community. All issues noted in this document were discussed and addressed.  A physical exam was not performed with this format.  I connected with Peter Mathis on 04/28/21 at  9:50 AM by telephone and verified that I am speaking with the correct person using two identifiers. Peter Mathis is currently located at home during visit. The provider, Ivy Lynn, NP is located in their office at time of visit.  I discussed the limitations, risks, security and privacy concerns of performing an evaluation and management service by telephone and the availability of in person appointments. I also discussed with the patient that there may be a patient responsible charge related to this service. The patient expressed understanding and agreed to proceed.   History and Present Illness:  Sore Throat  This is a new problem. The current episode started in the past 7 days. The problem has been gradually improving. Neither side of throat is experiencing more pain than the other. There has been no fever. The patient is experiencing no pain. Associated symptoms include congestion and coughing. Pertinent negatives include no vomiting. He has had no exposure to strep or mono. He has tried nothing for the symptoms.      Review of Systems  Constitutional: Positive for malaise/fatigue. Negative for chills and fever.  HENT: Positive for congestion.   Eyes: Negative.   Respiratory: Positive for cough.   Cardiovascular: Negative.   Gastrointestinal: Negative for nausea and vomiting.  Skin: Negative.   All other systems reviewed and are negative.    Observations/Objective: Televisit-patient  did not sound to be in distress.  Assessment and Plan: Pharyngitis Symptoms not well controlled in the last few days.  Patient recently positive for COVID.  Encourage patient to increase hydration, Tylenol or ibuprofen for pain, doxycycline 100 mg tablet for 5 days take medication as prescribed.  Rx sent to pharmacy.  Follow-up with worsening unresolved symptoms  Lab test positive for detection of COVID-19 virus Instructions given to patient for symptom management.  Completed infusion center orders.  Advised patient to monitor oxygen saturation to be above 91%.  Follow-up with worsening or unresolved symptoms.     Follow Up Instructions: Follow-up with worsening unresolved symptoms.    I discussed the assessment and treatment plan with the patient. The patient was provided an opportunity to ask questions and all were answered. The patient agreed with the plan and demonstrated an understanding of the instructions.   The patient was advised to call back or seek an in-person evaluation if the symptoms worsen or if the condition fails to improve as anticipated.  The above assessment and management plan was discussed with the patient. The patient verbalized understanding of and has agreed to the management plan. Patient is aware to call the clinic if symptoms persist or worsen. Patient is aware when to return to the clinic for a follow-up visit. Patient educated on when it is appropriate to go to the emergency department.   Time call ended: 10 AM  I provided 10 minutes of  non face-to-face time during this encounter.    Ivy Lynn, NP

## 2021-04-28 NOTE — Assessment & Plan Note (Signed)
Symptoms not well controlled in the last few days.  Patient recently positive for COVID.  Encourage patient to increase hydration, Tylenol or ibuprofen for pain, doxycycline 100 mg tablet for 5 days take medication as prescribed.  Rx sent to pharmacy.  Follow-up with worsening unresolved symptoms

## 2021-04-28 NOTE — Patient Instructions (Signed)
Pharyngitis  Pharyngitis is a sore throat (pharynx). This is when there is redness, pain, and swelling in your throat. Most of the time, this condition gets better on its own. In some cases, you may need medicine. Follow these instructions at home:  Take over-the-counter and prescription medicines only as told by your doctor. ? If you were prescribed an antibiotic medicine, take it as told by your doctor. Do not stop taking the antibiotic even if you start to feel better. ? Do not give children aspirin. Aspirin has been linked to Reye syndrome.  Drink enough water and fluids to keep your pee (urine) clear or pale yellow.  Get a lot of rest.  Rinse your mouth (gargle) with a salt-water mixture 3-4 times a day or as needed. To make a salt-water mixture, completely dissolve -1 tsp of salt in 1 cup of warm water.  If your doctor approves, you may use throat lozenges or sprays to soothe your throat. Contact a doctor if:  You have large, tender lumps in your neck.  You have a rash.  You cough up green, yellow-brown, or bloody spit. Get help right away if:  You have a stiff neck.  You drool or cannot swallow liquids.  You cannot drink or take medicines without throwing up.  You have very bad pain that does not go away with medicine.  You have problems breathing, and it is not from a stuffy nose.  You have new pain and swelling in your knees, ankles, wrists, or elbows. Summary  Pharyngitis is a sore throat (pharynx). This is when there is redness, pain, and swelling in your throat.  If you were prescribed an antibiotic medicine, take it as told by your doctor. Do not stop taking the antibiotic even if you start to feel better.  Most of the time, pharyngitis gets better on its own. Sometimes, you may need medicine. This information is not intended to replace advice given to you by your health care provider. Make sure you discuss any questions you have with your health care  provider. Document Revised: 11/26/2017 Document Reviewed: 01/19/2017 Elsevier Patient Education  2021 [employer].

## 2021-04-29 ENCOUNTER — Other Ambulatory Visit: Payer: Self-pay | Admitting: Physician Assistant

## 2021-04-29 ENCOUNTER — Ambulatory Visit: Payer: PPO | Admitting: Family Medicine

## 2021-04-29 DIAGNOSIS — U071 COVID-19: Secondary | ICD-10-CM

## 2021-04-29 MED ORDER — NIRMATRELVIR/RITONAVIR (PAXLOVID)TABLET: 3.0000 | ORAL_TABLET | Freq: Two times a day (BID) | ORAL | 0 refills | Status: AC

## 2021-04-29 NOTE — Progress Notes (Signed)
Outpatient Oral COVID Treatment Note  I connected with Peter Mathis on 04/29/2021/5:07 PM by telephone and verified that I am speaking with the correct person using two identifiers.  I discussed the limitations, risks, security, and privacy concerns of performing an evaluation and management service by telephone and the availability of in person appointments. I also discussed with the patient that there may be a patient responsible charge related to this service. The patient expressed understanding and agreed to proceed.  Patient location: home Provider location: office   Diagnosis: COVID-19 infection  Purpose of visit: Discussion of potential use of Molnupiravir or Paxlovid, a new treatment for mild to moderate COVID-19 viral infection in non-hospitalized patients.   Subjective: Patient is a 70 y.o. male who has been diagnosed with COVID 19 viral infection.  Their symptoms began on 4/30 with sore throat and body aches.    Past Medical History:  Diagnosis Date  . Chest discomfort 1989  . Diverticulosis   . DM type 2 (diabetes mellitus, type 2) ([place])   . Heart murmur   . History of kidney stones   . Hyperlipidemia     Allergies  Allergen Reactions  . Penicillins Rash    Did it involve swelling of the face/tongue/throat, SOB, or low BP? No Did it involve sudden or severe rash/hives, skin peeling, or any reaction on the inside of your mouth or nose? no Did you need to seek medical attention at a hospital or doctor's office? No When did it last happen?50 years ago If all above answers are "NO", may proceed with cephalosporin use.      Current Outpatient Medications:  .  aspirin 81 MG EC tablet, Take 81 mg by mouth daily., Disp: , Rfl:  .  atorvastatin (LIPITOR) 80 MG tablet, TAKE 1/2 TABLET BY MOUTH EVERY DAY AT BEDTIME, Disp: 45 tablet, Rfl: 1 .  Blood Glucose Monitoring Suppl (ONETOUCH VERIO) w/Device KIT, 1 Stick by Does not apply route 2 (two) times daily., Disp: 1 kit,  Rfl: 0 .  Cholecalciferol (VITAMIN D3) 5000 UNITS CAPS, Take 5,000 Units by mouth daily., Disp: , Rfl:  .  doxycycline (VIBRA-TABS) 100 MG tablet, Take 1 tablet (100 mg total) by mouth 2 (two) times daily., Disp: 14 tablet, Rfl: 0 .  fluticasone (FLONASE) 50 MCG/ACT nasal spray, Place 2 sprays into both nostrils daily., Disp: 16 g, Rfl: 6 .  Glycerin-Hypromellose-PEG 400 (VISINE DRY EYE) 0.2-0.2-1 % SOLN, Place 1-2 drops into both eyes 4 (four) times daily as needed., Disp: , Rfl:  .  Krill Oil 1000 MG CAPS, Take 1,000 mg by mouth daily., Disp: , Rfl:  .  lisinopril (ZESTRIL) 5 MG tablet, Take 1 tablet (5 mg total) by mouth daily., Disp: 90 tablet, Rfl: 1 .  metFORMIN (GLUCOPHAGE) 500 MG tablet, Take 2 tablets (1,000 mg total) by mouth 2 (two) times daily with a meal., Disp: 360 tablet, Rfl: 3 .  Multiple Vitamin (MULTIVITAMIN WITH MINERALS) TABS tablet, Take 1 tablet by mouth daily., Disp: , Rfl:  .  sildenafil (REVATIO) 20 MG tablet, Take 1 tablet (20 mg total) by mouth as needed. Take 2-5 tabs as needed prior to sexual activityTake 20 mg by mouth., Disp: 10 tablet, Rfl: 2 .  sitaGLIPtin (JANUVIA) 100 MG tablet, Take 1 tablet (100 mg total) by mouth daily., Disp: 90 tablet, Rfl: 1  Objective: Patient sound good.  They are in no apparent distress.  Breathing is non labored.  Mood and behavior are normal.  Laboratory  Data:  No results found for this or any previous visit (from the past 2160 hour(s)).   Assessment: 70 y.o. male with mild/moderate COVID 19 viral infection diagnosed on 5/1 at high risk for progression to severe COVID 19.  Plan:  This patient is a 70 y.o. male that meets the following criteria for Emergency Use Authorization of: Paxlovid 1. Age >12 yr AND > 40 kg 2. SARS-COV-2 positive test 3. Symptom onset < 5 days 4. Mild-to-moderate COVID disease with high risk for severe progression to hospitalization or death  I have spoken and communicated the following to the patient or  parent/caregiver regarding: 1. Paxlovid is an unapproved drug that is authorized for use under an Emergency Use Authorization.  2. There are no adequate, approved, available products for the treatment of COVID-19 in adults who have mild-to-moderate COVID-19 and are at high risk for progressing to severe COVID-19, including hospitalization or death. 3. Other therapeutics are currently authorized. For additional information on all products authorized for treatment or prevention of COVID-19, please see [email or web address].  4. There are benefits and risks of taking this treatment as outlined in the "Fact Sheet for Patients and Caregivers."  5. "Fact Sheet for Patients and Caregivers" was reviewed with patient. A hard copy will be provided to patient from pharmacy prior to the patient receiving treatment. 6. Patients should continue to self-isolate and use infection control measures (e.g., wear mask, isolate, social distance, avoid sharing personal items, clean and disinfect "high touch" surfaces, and frequent handwashing) according to CDC guidelines.  7. The patient or parent/caregiver has the option to accept or refuse treatment. 8. Patient medication history was reviewed for potential drug interactions:Interaction with home meds: Sildenafil, which he uses PRN and hasnt taken recently 9. Patient's GFR was calculated to be 85, and they were therefore prescribed Normal dose (GFR>60) - nirmatrelvir 128m tab (2 tablet) by mouth twice daily AND ritonavir 1088mtab (1 tablet) by mouth twice daily   After reviewing above information with the patient, the patient agrees to receive Paxlovid.  Follow up instructions:    . Take prescription BID x 5 days as directed . Reach out to pharmacist for counseling on medication if desired . For concerns regarding further COVID symptoms please follow up with your PCP  or urgent care . For urgent or life-threatening issues, seek care at your local emergency department  The patient was provided an opportunity to ask questions, and all were answered. The patient agreed with the plan and demonstrated an understanding of the instructions.   [facility]  The patient was advised to call their PCP or seek an in-person evaluation if the symptoms worsen or if the condition fails to improve as anticipated.   I provided 10 minutes of non face-to-face telephone visit time during this encounter, and > 50% was spent counseling as documented under my assessment & plan.  KaAngelena Mathis 04/29/2021 /5Biagio BorgM

## 2021-05-07 ENCOUNTER — Ambulatory Visit (INDEPENDENT_AMBULATORY_CARE_PROVIDER_SITE_OTHER): Payer: PPO | Admitting: Family Medicine

## 2021-05-07 ENCOUNTER — Other Ambulatory Visit: Payer: Self-pay

## 2021-05-07 ENCOUNTER — Encounter: Payer: Self-pay | Admitting: Family Medicine

## 2021-05-07 VITALS — BP 138/66 | HR 86 | Temp 97.7°F | Ht 71.0 in | Wt 168.4 lb

## 2021-05-07 DIAGNOSIS — E119 Type 2 diabetes mellitus without complications: Secondary | ICD-10-CM | POA: Diagnosis not present

## 2021-05-07 DIAGNOSIS — E11319 Type 2 diabetes mellitus with unspecified diabetic retinopathy without macular edema: Secondary | ICD-10-CM

## 2021-05-07 DIAGNOSIS — H6592 Unspecified nonsuppurative otitis media, left ear: Secondary | ICD-10-CM | POA: Diagnosis not present

## 2021-05-07 DIAGNOSIS — E785 Hyperlipidemia, unspecified: Secondary | ICD-10-CM

## 2021-05-07 DIAGNOSIS — E782 Mixed hyperlipidemia: Secondary | ICD-10-CM

## 2021-05-07 DIAGNOSIS — E1169 Type 2 diabetes mellitus with other specified complication: Secondary | ICD-10-CM | POA: Diagnosis not present

## 2021-05-07 LAB — BAYER DCA HB A1C WAIVED: HB A1C (BAYER DCA - WAIVED): 7.5 % — ABNORMAL HIGH (ref <7.0)

## 2021-05-07 MED ORDER — LEVOFLOXACIN 500 MG PO TABS: 500.0000 mg | ORAL_TABLET | Freq: Every day | ORAL | 0 refills | Status: DC

## 2021-05-07 MED ORDER — LISINOPRIL 5 MG PO TABS: 5.0000 mg | ORAL_TABLET | Freq: Every day | ORAL | 1 refills | Status: DC

## 2021-05-07 MED ORDER — ATORVASTATIN CALCIUM 80 MG PO TABS: ORAL_TABLET | ORAL | 1 refills | Status: DC

## 2021-05-07 NOTE — Addendum Note (Signed)
Addended by: [name] on: 05/07/2021 08:51 AM   Modules accepted: Orders

## 2021-05-07 NOTE — Patient Instructions (Signed)
Diabetes Mellitus and Foot Care Foot care is an important part of your health, especially when you have diabetes. Diabetes may cause you to have problems because of poor blood flow (circulation) to your feet and legs, which can cause your skin to:  Become thinner and drier.  Break more easily.  Heal more slowly.  Peel and crack. You may also have nerve damage (neuropathy) in your legs and feet, causing decreased feeling in them. This means that you may not notice minor injuries to your feet that could lead to more serious problems. Noticing and addressing any potential problems early is the best way to prevent future foot problems. How to care for your feet Foot hygiene  Wash your feet daily with warm water and mild soap. Do not use hot water. Then, pat your feet and the areas between your toes until they are completely dry. Do not soak your feet as this can dry your skin.  Trim your toenails straight across. Do not dig under them or around the cuticle. File the edges of your nails with an emery board or nail file.  Apply a moisturizing lotion or petroleum jelly to the skin on your feet and to dry, brittle toenails. Use lotion that does not contain alcohol and is unscented. Do not apply lotion between your toes.   Shoes and socks  Wear clean socks or stockings every day. Make sure they are not too tight. Do not wear knee-high stockings since they may decrease blood flow to your legs.  Wear shoes that fit properly and have enough cushioning. Always look in your shoes before you put them on to be sure there are no objects inside.  To break in new shoes, wear them for just a few hours a day. This prevents injuries on your feet. Wounds, scrapes, corns, and calluses  Check your feet daily for blisters, cuts, bruises, sores, and redness. If you cannot see the bottom of your feet, use a mirror or ask someone for help.  Do not cut corns or calluses or try to remove them with medicine.  If you  find a minor scrape, cut, or break in the skin on your feet, keep it and the skin around it clean and dry. You may clean these areas with mild soap and water. Do not clean the area with peroxide, alcohol, or iodine.  If you have a wound, scrape, corn, or callus on your foot, look at it several times a day to make sure it is healing and not infected. Check for: ? Redness, swelling, or pain. ? Fluid or blood. ? Warmth. ? Pus or a bad smell.   General tips  Do not cross your legs. This may decrease blood flow to your feet.  Do not use heating pads or hot water bottles on your feet. They may burn your skin. If you have lost feeling in your feet or legs, you may not know this is happening until it is too late.  Protect your feet from hot and cold by wearing shoes, such as at the beach or on hot pavement.  Schedule a complete foot exam at least once a year (annually) or more often if you have foot problems. Report any cuts, sores, or bruises to your health care provider immediately. Where to find more information  American Diabetes Association: www.diabetes.org  Association of Diabetes Care & Education Specialists: www.diabeteseducator.org Contact a health care provider if:  You have a medical condition that increases your risk of infection and   you have any cuts, sores, or bruises on your feet.  You have an injury that is not healing.  You have redness on your legs or feet.  You feel burning or tingling in your legs or feet.  You have pain or cramps in your legs and feet.  Your legs or feet are numb.  Your feet always feel cold.  You have pain around any toenails. Get help right away if:  You have a wound, scrape, corn, or callus on your foot and: ? You have pain, swelling, or redness that gets worse. ? You have fluid or blood coming from the wound, scrape, corn, or callus. ? Your wound, scrape, corn, or callus feels warm to the touch. ? You have pus or a bad smell coming from  the wound, scrape, corn, or callus. ? You have a fever. ? You have a red line going up your leg. Summary  Check your feet every day for blisters, cuts, bruises, sores, and redness.  Apply a moisturizing lotion or petroleum jelly to the skin on your feet and to dry, brittle toenails.  Wear shoes that fit properly and have enough cushioning.  If you have foot problems, report any cuts, sores, or bruises to your health care provider immediately.  Schedule a complete foot exam at least once a year (annually) or more often if you have foot problems. This information is not intended to replace advice given to you by your health care provider. Make sure you discuss any questions you have with your health care provider. Document Revised: 07/04/2020 Document Reviewed: 07/04/2020 Elsevier Patient Education  [place]. https://www.diabeteseducator.org/docs/default-source/living-with-diabetes/conquering-the-grocery-store-v1.pdf?sfvrsn=4">  Carbohydrate Counting for Diabetes Mellitus, Adult Carbohydrate counting is a method of keeping track of how many carbohydrates you eat. Eating carbohydrates naturally increases the amount of sugar (glucose) in the blood. Counting how many carbohydrates you eat improves your blood glucose control, which helps you manage your diabetes. It is important to know how many carbohydrates you can safely have in each meal. This is different for every person. A dietitian can help you make a meal plan and calculate how many carbohydrates you should have at each meal and snack. What foods contain carbohydrates? Carbohydrates are found in the following foods:  Grains, such as breads and cereals.  Dried beans and soy products.  Starchy vegetables, such as potatoes, peas, and corn.  Fruit and fruit juices.  Milk and yogurt.  Sweets and snack foods, such as cake, cookies, candy, chips, and soft drinks.   How do I count carbohydrates in foods? There are two ways to  count carbohydrates in food. You can read food labels or learn standard serving sizes of foods. You can use either of the methods or a combination of both. Using the Nutrition Facts label The Nutrition Facts list is included on the labels of almost all packaged foods and beverages in the [place] It includes:  The serving size.  Information about nutrients in each serving, including the grams (g) of carbohydrate per serving. To use the Nutrition Facts:  Decide how many servings you will have.  Multiply the number of servings by the number of carbohydrates per serving.  The resulting number is the total amount of carbohydrates that you will be having. Learning the standard serving sizes of foods When you eat carbohydrate foods that are not packaged or do not include Nutrition Facts on the label, you need to measure the servings in order to count the amount of carbohydrates.  Measure the  foods that you will eat with a food scale or measuring cup, if needed.  Decide how many standard-size servings you will eat.  Multiply the number of servings by 15. For foods that contain carbohydrates, one serving equals 15 g of carbohydrates. ? For example, if you eat 2 cups or 10 oz (300 g) of strawberries, you will have eaten 2 servings and 30 g of carbohydrates (2 servings x 15 g = 30 g).  For foods that have more than one food mixed, such as soups and casseroles, you must count the carbohydrates in each food that is included. The following list contains standard serving sizes of common carbohydrate-rich foods. Each of these servings has about 15 g of carbohydrates:  1 slice of bread.  1 six-inch (15 cm) tortilla.  ? cup or 2 oz (53 g) cooked rice or pasta.   cup or 3 oz (85 g) cooked or canned, drained and rinsed beans or lentils.   cup or 3 oz (85 g) starchy vegetable, such as peas, corn, or squash.   cup or 4 oz (120 g) hot cereal.   cup or 3 oz (85 g) boiled or mashed potatoes, or  or 3  oz (85 g) of a large baked potato.   cup or 4 fl oz (118 mL) fruit juice.  1 cup or 8 fl oz (237 mL) milk.  1 small or 4 oz (106 g) apple.   or 2 oz (63 g) of a medium banana.  1 cup or 5 oz (150 g) strawberries.  3 cups or 1 oz (24 g) popped popcorn. What is an example of carbohydrate counting? To calculate the number of carbohydrates in this sample meal, follow the steps shown below. Sample meal  3 oz (85 g) chicken breast.  ? cup or 4 oz (106 g) brown rice.   cup or 3 oz (85 g) corn.  1 cup or 8 fl oz (237 mL) milk.  1 cup or 5 oz (150 g) strawberries with sugar-free whipped topping. Carbohydrate calculation 1. Identify the foods that contain carbohydrates: ? Rice. ? Corn. ? Milk. ? Strawberries. 2. Calculate how many servings you have of each food: ? 2 servings rice. ? 1 serving corn. ? 1 serving milk. ? 1 serving strawberries. 3. Multiply each number of servings by 15 g: ? 2 servings rice x 15 g = 30 g. ? 1 serving corn x 15 g = 15 g. ? 1 serving milk x 15 g = 15 g. ? 1 serving strawberries x 15 g = 15 g. 4. Add together all of the amounts to find the total grams of carbohydrates eaten: ? 30 g + 15 g + 15 g + 15 g = 75 g of carbohydrates total. What are tips for following this plan? Shopping  Develop a meal plan and then make a shopping list.  Buy fresh and frozen vegetables, fresh and frozen fruit, dairy, eggs, beans, lentils, and whole grains.  Look at food labels. Choose foods that have more fiber and less sugar.  Avoid processed foods and foods with added sugars. Meal planning  Aim to have the same amount of carbohydrates at each meal and for each snack time.  Plan to have regular, balanced meals and snacks. Where to find more information  American Diabetes Association: www.diabetes.org  Centers for Disease Control and Prevention: http://www.wolf.info/ Summary  Carbohydrate counting is a method of keeping track of how many carbohydrates you  eat.  Eating carbohydrates naturally increases  the amount of sugar (glucose) in the blood.  Counting how many carbohydrates you eat improves your blood glucose control, which helps you manage your diabetes.  A dietitian can help you make a meal plan and calculate how many carbohydrates you should have at each meal and snack. This information is not intended to replace advice given to you by your health care provider. Make sure you discuss any questions you have with your health care provider. Document Revised: 12/14/2019 Document Reviewed: 12/15/2019 Elsevier Patient Education  2021 [employer].

## 2021-05-07 NOTE — Progress Notes (Addendum)
Subjective:  Patient ID: Peter Mathis, male    DOB: 05/16/1951  Age: 70 y.o. MRN: [id number]  CC: Medical Management of Chronic Issues   HPI [name] presents forFollow-up of diabetes. Patient checks blood sugar at home.   118-150 fasting and similar postprandial Patient denies symptoms such as polyuria, polydipsia, excessive hunger, nausea No significant hypoglycemic spells noted. Medications reviewed. Pt reports taking them regularly without complication/adverse reaction being reported today.  Checking feet daily.  History Peter Mathis has a past medical history of Chest discomfort (1989), Diverticulosis, DM type 2 (diabetes mellitus, type 2) ([place]), Heart murmur, History of kidney stones, and Hyperlipidemia.   He has a past surgical history that includes Tonsillectomy and adenoidectomy (01/1955); Ganglion cyst excision (Right); and Inguinal hernia repair (Left, 04/03/2020).   His family history includes Diabetes in his mother; Heart attack in his father.He reports that he has never smoked. He has never used smokeless tobacco. He reports that he does not drink alcohol and does not use drugs.  Current Outpatient Medications on File Prior to Visit  Medication Sig Dispense Refill  . aspirin 81 MG EC tablet Take 81 mg by mouth daily.    . Blood Glucose Monitoring Suppl (ONETOUCH VERIO) w/Device KIT 1 Stick by Does not apply route 2 (two) times daily. 1 kit 0  . Cholecalciferol (VITAMIN D3) 5000 UNITS CAPS Take 5,000 Units by mouth daily.    . fluticasone (FLONASE) 50 MCG/ACT nasal spray Place 2 sprays into both nostrils daily. 16 g 6  . Glycerin-Hypromellose-PEG 400 (VISINE DRY EYE) 0.2-0.2-1 % SOLN Place 1-2 drops into both eyes 4 (four) times daily as needed.    Javier Docker Oil 1000 MG CAPS Take 1,000 mg by mouth daily.    . metFORMIN (GLUCOPHAGE) 500 MG tablet Take 2 tablets (1,000 mg total) by mouth 2 (two) times daily with a meal. 360 tablet 3  . Multiple Vitamin (MULTIVITAMIN WITH  MINERALS) TABS tablet Take 1 tablet by mouth daily.    . sildenafil (REVATIO) 20 MG tablet Take 1 tablet (20 mg total) by mouth as needed. Take 2-5 tabs as needed prior to sexual activityTake 20 mg by mouth. 10 tablet 2  . sitaGLIPtin (JANUVIA) 100 MG tablet Take 1 tablet (100 mg total) by mouth daily. 90 tablet 1   No current facility-administered medications on file prior to visit.    ROS Review of Systems  Constitutional: Negative.   HENT: Positive for ear pain (left, since recent CoVID. Feels like fluid sloshing in it).   Eyes: Negative for visual disturbance.  Respiratory: Negative for cough and shortness of breath.   Cardiovascular: Negative for chest pain and leg swelling.  Gastrointestinal: Negative for abdominal pain, diarrhea, nausea and vomiting.  Genitourinary: Negative for difficulty urinating.  Musculoskeletal: Negative for arthralgias and myalgias.  Skin: Negative for rash.  Neurological: Negative for headaches.  Psychiatric/Behavioral: Negative for sleep disturbance.    Objective:  BP 138/66   Pulse 86   Temp 97.7 F (36.5 C)   Ht '5\' 11"'  (1.803 m)   Wt 168 lb 6.4 oz (76.4 kg)   SpO2 100%   BMI 23.49 kg/m   BP Readings from Last 3 Encounters:  05/07/21 138/66  01/28/21 136/62  10/28/20 139/75    Wt Readings from Last 3 Encounters:  05/07/21 168 lb 6.4 oz (76.4 kg)  01/28/21 170 lb 12.8 oz (77.5 kg)  10/28/20 174 lb 8 oz (79.2 kg)     Physical Exam Vitals reviewed.  Constitutional:      Appearance: He is well-developed.  HENT:     Head: Normocephalic and atraumatic.     Right Ear: External ear normal.     Left Ear: External ear normal.     Mouth/Throat:     Pharynx: No oropharyngeal exudate or posterior oropharyngeal erythema.  Eyes:     Pupils: Pupils are equal, round, and reactive to light.  Cardiovascular:     Rate and Rhythm: Normal rate and regular rhythm.     Heart sounds: No murmur heard.   Pulmonary:     Effort: No respiratory  distress.     Breath sounds: Normal breath sounds.  Musculoskeletal:     Cervical back: Normal range of motion and neck supple.  Neurological:     Mental Status: He is alert and oriented to person, place, and time.       Assessment & Plan:   Bharath was seen today for medical management of chronic issues.  Diagnoses and all orders for this visit:  Hyperlipidemia associated with type 2 diabetes mellitus ([place]) -     Lipid panel  Type 2 diabetes mellitus without complication, without long-term current use of insulin (HCC) -     Bayer DCA Hb A1c Waived -     CMP14+EGFR -     CBC with Differential/Platelet  Mixed hyperlipidemia  Diabetic retinopathy associated with type 2 diabetes mellitus, macular edema presence unspecified, unspecified laterality, unspecified retinopathy severity (HCC)  Other orders -     atorvastatin (LIPITOR) 80 MG tablet; TAKE 1/2 TABLET BY MOUTH EVERY DAY AT BEDTIME -     lisinopril (ZESTRIL) 5 MG tablet; Take 1 tablet (5 mg total) by mouth daily.      I have discontinued [name]'s doxycycline. I am also having him maintain his aspirin, Vitamin D3, OneTouch Verio, Krill Oil, multivitamin with minerals, Visine Dry Eye, metFORMIN, sitaGLIPtin, sildenafil, fluticasone, atorvastatin, and lisinopril.  Meds ordered this encounter  Medications  . atorvastatin (LIPITOR) 80 MG tablet    Sig: TAKE 1/2 TABLET BY MOUTH EVERY DAY AT BEDTIME    Dispense:  45 tablet    Refill:  1  . lisinopril (ZESTRIL) 5 MG tablet    Sig: Take 1 tablet (5 mg total) by mouth daily.    Dispense:  90 tablet    Refill:  1     Follow-up: Return in about 3 months (around 08/07/2021) for diabetes.  [name], M.D.

## 2021-05-08 ENCOUNTER — Telehealth: Payer: PPO

## 2021-05-08 LAB — CBC WITH DIFFERENTIAL/PLATELET
Basophils Absolute: 0 10*3/uL (ref 0.0–0.2)
Basos: 0 %
EOS (ABSOLUTE): 0.2 10*3/uL (ref 0.0–0.4)
Eos: 2 %
Hematocrit: 45.6 % (ref 37.5–51.0)
Hemoglobin: 15.5 g/dL (ref 13.0–17.7)
Immature Grans (Abs): 0.1 10*3/uL (ref 0.0–0.1)
Immature Granulocytes: 1 %
Lymphocytes Absolute: 3.4 10*3/uL — ABNORMAL HIGH (ref 0.7–3.1)
Lymphs: 33 %
MCH: 29.8 pg (ref 26.6–33.0)
MCHC: 34 g/dL (ref 31.5–35.7)
MCV: 88 fL (ref 79–97)
Monocytes Absolute: 0.9 10*3/uL (ref 0.1–0.9)
Monocytes: 8 %
Neutrophils Absolute: 5.6 10*3/uL (ref 1.4–7.0)
Neutrophils: 56 %
Platelets: 370 10*3/uL (ref 150–450)
RBC: 5.21 x10E6/uL (ref 4.14–5.80)
RDW: 12.4 % (ref 11.6–15.4)
WBC: 10.2 10*3/uL (ref 3.4–10.8)

## 2021-05-08 LAB — CMP14+EGFR
ALT: 34 IU/L (ref 0–44)
AST: 22 IU/L (ref 0–40)
Albumin/Globulin Ratio: 1.5 (ref 1.2–2.2)
Albumin: 4.4 g/dL (ref 3.7–4.7)
Alkaline Phosphatase: 52 IU/L (ref 44–121)
BUN/Creatinine Ratio: 17 (ref 10–24)
BUN: 16 mg/dL (ref 8–27)
Bilirubin Total: 0.3 mg/dL (ref 0.0–1.2)
CO2: 25 mmol/L (ref 20–29)
Calcium: 9.8 mg/dL (ref 8.6–10.2)
Chloride: 96 mmol/L (ref 96–106)
Creatinine, Ser: 0.94 mg/dL (ref 0.76–1.27)
Globulin, Total: 2.9 g/dL (ref 1.5–4.5)
Glucose: 153 mg/dL — ABNORMAL HIGH (ref 65–99)
Potassium: 5.2 mmol/L (ref 3.5–5.2)
Sodium: 135 mmol/L (ref 134–144)
Total Protein: 7.3 g/dL (ref 6.0–8.5)
eGFR: 87 mL/min/{1.73_m2} (ref >59)

## 2021-05-08 LAB — LIPID PANEL
Chol/HDL Ratio: 3.3 ratio (ref 0.0–5.0)
Cholesterol, Total: 123 mg/dL (ref 100–199)
HDL: 37 mg/dL — ABNORMAL LOW (ref >39)
LDL Chol Calc (NIH): 58 mg/dL (ref 0–99)
Triglycerides: 163 mg/dL — ABNORMAL HIGH (ref 0–149)
VLDL Cholesterol Cal: 28 mg/dL (ref 5–40)

## 2021-05-08 NOTE — Progress Notes (Signed)
Hello Marv,  Your lab result is normal and/or stable.Some minor variations that are not significant are commonly marked abnormal, but do not represent any medical problem for you.  Best regards, Zhana Jeangilles, M.D.

## 2021-05-29 ENCOUNTER — Other Ambulatory Visit: Payer: Self-pay | Admitting: Family Medicine

## 2021-06-01 ENCOUNTER — Encounter: Payer: Self-pay | Admitting: Family Medicine

## 2021-06-18 ENCOUNTER — Ambulatory Visit (INDEPENDENT_AMBULATORY_CARE_PROVIDER_SITE_OTHER): Payer: PPO | Admitting: Licensed Clinical Social Worker

## 2021-06-18 DIAGNOSIS — E559 Vitamin D deficiency, unspecified: Secondary | ICD-10-CM

## 2021-06-18 DIAGNOSIS — E119 Type 2 diabetes mellitus without complications: Secondary | ICD-10-CM

## 2021-06-18 DIAGNOSIS — E782 Mixed hyperlipidemia: Secondary | ICD-10-CM

## 2021-06-18 DIAGNOSIS — N400 Enlarged prostate without lower urinary tract symptoms: Secondary | ICD-10-CM

## 2021-06-18 DIAGNOSIS — E11319 Type 2 diabetes mellitus with unspecified diabetic retinopathy without macular edema: Secondary | ICD-10-CM | POA: Diagnosis not present

## 2021-06-18 NOTE — Patient Instructions (Signed)
Visit Information  PATIENT GOALS:  Goals Addressed             This Visit's Progress    Manage My Emotions; Complete ADLs daily as able       Timeframe:  Short-Term Goal Priority:  Medium Progress: On Track Start Date:             06/18/21                Expected End Date:           09/12/21            Follow Up Date 08/01/21   Manage emotions: Complete ADLs daily as able    Why is this important?   When you are stressed, down or upset, your body reacts too.  For example, your blood pressure may get higher; you may have a headache or stomachache.  When your emotions get the best of you, your body's ability to fight off cold and flu gets weak.  These steps will help you manage your emotions.     Patient Coping Skills: Completes ADLs daily, as able Attends scheduled medical appointments Takes medications as prescribed  Patient Deficits: Vision challenges  Patient Goals:  In next 30 days,patient will: Call RNCM or LCSW as needed for CCM program support Attend scheduled medical appointments Talk with tyon, cerasoli, regularly, about client needs -  Follow Up Plan: LCSW to call client on 08/01/21     [name].Smera Guyette MSW, LCSW Licensed Clinical Social Worker [facility] Care Management [phone]

## 2021-06-18 NOTE — Chronic Care Management (AMB) (Signed)
Chronic Care Management    Clinical Social Work Note  06/18/2021 Name: PATRICK SALEMI MRN: [id number] DOB: 18-Feb-1951  [name] Raap is a 70 y.o. year old male who is a primary care patient of Stacks, Cletus Gash, MD. The CCM team was consulted to assist the patient with chronic disease management and/or care coordination needs related to: [name] .   Engaged with patient by telephone for follow up visit in response to provider referral for social work chronic care management and care coordination services.   Consent to Services:  The patient was given information about Chronic Care Management services, agreed to services, and gave verbal consent prior to initiation of services.  Please see initial visit note for detailed documentation.   Patient agreed to services and consent obtained.   Assessment: Review of patient past medical history, allergies, medications, and health status, including review of relevant consultants reports was performed today as part of a comprehensive evaluation and provision of chronic care management and care coordination services.     SDOH (Social Determinants of Health) assessments and interventions performed:  SDOH Interventions    Flowsheet Row Most Recent Value  SDOH Interventions   Depression Interventions/Treatment  --  [informed client of LCSW support and of RNCM support]        Advanced Directives Status: See Vynca application for related entries.  CCM Care Plan  Allergies  Allergen Reactions   Penicillins Rash    Did it involve swelling of the face/tongue/throat, SOB, or low BP? No Did it involve sudden or severe rash/hives, skin peeling, or any reaction on the inside of your mouth or nose? no Did you need to seek medical attention at a hospital or doctor's office? No When did it last happen?     50 years ago  If all above answers are "NO", may proceed with cephalosporin use.     Outpatient Encounter Medications as of  06/18/2021  Medication Sig   aspirin 81 MG EC tablet Take 81 mg by mouth daily.   atorvastatin (LIPITOR) 80 MG tablet TAKE 1/2 TABLET BY MOUTH EVERY DAY AT BEDTIME   Blood Glucose Monitoring Suppl (ONETOUCH VERIO) w/Device KIT 1 Stick by Does not apply route 2 (two) times daily.   Cholecalciferol (VITAMIN D3) 5000 UNITS CAPS Take 5,000 Units by mouth daily.   fluticasone (FLONASE) 50 MCG/ACT nasal spray Place 2 sprays into both nostrils daily.   Glycerin-Hypromellose-PEG 400 (VISINE DRY EYE) 0.2-0.2-1 % SOLN Place 1-2 drops into both eyes 4 (four) times daily as needed.   Krill Oil 1000 MG CAPS Take 1,000 mg by mouth daily.   levofloxacin (LEVAQUIN) 500 MG tablet Take 1 tablet (500 mg total) by mouth daily. For 10 days   lisinopril (ZESTRIL) 5 MG tablet Take 1 tablet (5 mg total) by mouth daily.   metFORMIN (GLUCOPHAGE) 500 MG tablet Take 2 tablets (1,000 mg total) by mouth 2 (two) times daily with a meal.   Multiple Vitamin (MULTIVITAMIN WITH MINERALS) TABS tablet Take 1 tablet by mouth daily.   sildenafil (REVATIO) 20 MG tablet TAKE 2-5 TABLETS AS NEEDED PRIOR TO SEXUAL ACTIVITY   sitaGLIPtin (JANUVIA) 100 MG tablet Take 1 tablet (100 mg total) by mouth daily.   No facility-administered encounter medications on file as of 06/18/2021.    Patient Active Problem List   Diagnosis Date Noted   Pharyngitis 04/28/2021   Lab test positive for detection of COVID-19 virus 04/28/2021   Left inguinal hernia 03/14/2020   Diabetic retinopathy (  [place]) 01/19/2020   Vitamin D deficiency 10/04/2019   Erectile dysfunction due to type 2 diabetes mellitus (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/12/2017   Seborrheic keratosis 01/12/2017   Erectile dysfunction due to arterial insufficiency 01/23/2016   Right bundle branch block, history of 01/17/2014   Hyperlipidemia associated with type 2 diabetes mellitus ([place]) 07/12/2013   Type II diabetes mellitus ([place]) 07/12/2013   Benign prostatic  hyperplasia without lower urinary tract symptoms 07/12/2013    Conditions to be addressed/monitored: Monitor client completion of ADLs; monitor client management of vision needs  Care Plan : LCSW Care Plan  Updates made by Katha Cabal, LCSW since 06/18/2021 12:00 AM     Problem: Coping Skills (General Plan of Care)      Goal: Coping Skills Enhanced: Manage ADL completion and manage vision issues   Start Date: 06/18/2021  Expected End Date: 09/12/2021  This Visit's Progress: On track  Priority: Medium  Note:   Current barriers:   Patient in need of assistance with connecting to community resources for possible help with ADL completion and with vision issues of client Challenges in managing medical needs  Clinical Goals:  patient will work with SW monthly to address concerns related to ADLs completion of client and vision challenges of client Client will attend all scheduled client medical appointments in next 30 days   Clinical Interventions:  Collaboration with [name], MD regarding development and update of comprehensive plan of care as evidenced by provider attestation and co-signature Assessment of needs, barriers  of client Talked with client about needs of client Talked with client about vision needs (he said he has a scheduled eye appointment tomorrow) Talked with client about sleeping issues of client Talked with client about medication procurement Talked with Shanan about his ADLs completion (he said he is having no problem completing ADLs) Talked with Valeriano about pain issues of client Talked with Kraven about CCM program support  Patient Coping Skills: Completes ADLs daily, as able Attends scheduled medical appointments Takes medications as prescribed  Patient Deficits: Vision challenges  Patient Goals:  In next 30 days,patient will: Call RNCM or LCSW as needed for CCM program support Attend scheduled medical appointments Talk with gerritt, galentine, regularly, about client needs -  Follow Up Plan: LCSW to call client on 08/01/21     [name].Erbie Arment MSW, LCSW Licensed Clinical Social Worker [facility] Care Management [phone]

## 2021-06-19 DIAGNOSIS — H4901 Third [oculomotor] nerve palsy, right eye: Secondary | ICD-10-CM | POA: Diagnosis not present

## 2021-06-19 DIAGNOSIS — H43811 Vitreous degeneration, right eye: Secondary | ICD-10-CM | POA: Diagnosis not present

## 2021-06-19 DIAGNOSIS — E119 Type 2 diabetes mellitus without complications: Secondary | ICD-10-CM | POA: Diagnosis not present

## 2021-06-19 DIAGNOSIS — H2513 Age-related nuclear cataract, bilateral: Secondary | ICD-10-CM | POA: Diagnosis not present

## 2021-06-19 LAB — HM DIABETES EYE EXAM

## 2021-08-01 ENCOUNTER — Telehealth: Payer: PPO

## 2021-08-07 ENCOUNTER — Ambulatory Visit (INDEPENDENT_AMBULATORY_CARE_PROVIDER_SITE_OTHER): Payer: PPO | Admitting: Family Medicine

## 2021-08-07 ENCOUNTER — Encounter: Payer: Self-pay | Admitting: Family Medicine

## 2021-08-07 ENCOUNTER — Other Ambulatory Visit: Payer: Self-pay

## 2021-08-07 VITALS — BP 124/68 | HR 71 | Temp 98.2°F | Ht 71.0 in | Wt 172.4 lb

## 2021-08-07 DIAGNOSIS — E119 Type 2 diabetes mellitus without complications: Secondary | ICD-10-CM | POA: Diagnosis not present

## 2021-08-07 DIAGNOSIS — E782 Mixed hyperlipidemia: Secondary | ICD-10-CM

## 2021-08-07 LAB — BAYER DCA HB A1C WAIVED: HB A1C (BAYER DCA - WAIVED): 7.3 % — ABNORMAL HIGH (ref <7.0)

## 2021-08-07 MED ORDER — SITAGLIPTIN PHOSPHATE 100 MG PO TABS: 100.0000 mg | ORAL_TABLET | Freq: Every day | ORAL | 1 refills | Status: DC

## 2021-08-07 MED ORDER — ATORVASTATIN CALCIUM 80 MG PO TABS: ORAL_TABLET | ORAL | 3 refills | Status: DC

## 2021-08-07 MED ORDER — METFORMIN HCL 500 MG PO TABS: 1000.0000 mg | ORAL_TABLET | Freq: Two times a day (BID) | ORAL | 3 refills | Status: DC

## 2021-08-07 MED ORDER — LISINOPRIL 5 MG PO TABS: 5.0000 mg | ORAL_TABLET | Freq: Every day | ORAL | 3 refills | Status: DC

## 2021-08-07 NOTE — Progress Notes (Signed)
Subjective:  Patient ID: Peter Mathis,  male    DOB: [birth year]  Age: 70 y.o.    CC: Medical Management of Chronic Issues   HPI Peter Mathis presents for  follow-up of hypertension. Patient has no history of headache chest pain or shortness of breath or recent cough. Patient also denies symptoms of TIA such as numbness weakness lateralizing. Patient denies side effects from medication. States taking it regularly.  Patient also  in for follow-up of elevated cholesterol. Doing well without complaints on current medication. Denies side effects  including myalgia and arthralgia and nausea. Also in today for liver function testing. Currently no chest pain, shortness of breath or other cardiovascular related symptoms noted.  Follow-up of diabetes. Patient does check blood sugar at home. Readings run between 160-165 fasting and 120-140 prandial Patient denies symptoms such as excessive hunger or urinary frequency, excessive hunger, nausea No significant hypoglycemic spells noted. Medications reviewed. Pt reports taking them regularly. Pt. denies complication/adverse reaction today.    History Peter Mathis has a past medical history of Chest discomfort (1989), Diverticulosis, DM type 2 (diabetes mellitus, type 2) ([place]), Heart murmur, History of kidney stones, and Hyperlipidemia.   Peter Mathis has a past surgical history that includes Tonsillectomy and adenoidectomy (01/1955); Ganglion cyst excision (Right); and Inguinal hernia repair (Left, 04/03/2020).   His family history includes Diabetes in his mother; Heart attack in his father.Peter Mathis reports that Peter Mathis has never smoked. Peter Mathis has never used smokeless tobacco. Peter Mathis reports that Peter Mathis does not drink alcohol and does not use drugs.  Current Outpatient Medications on File Prior to Visit  Medication Sig Dispense Refill   aspirin 81 MG EC tablet Take 81 mg by mouth daily.     Blood Glucose Monitoring Suppl (ONETOUCH VERIO) w/Device KIT 1 Stick by Does not apply  route 2 (two) times daily. 1 kit 0   Cholecalciferol (VITAMIN D3) 5000 UNITS CAPS Take 5,000 Units by mouth daily.     fluticasone (FLONASE) 50 MCG/ACT nasal spray Place 2 sprays into both nostrils daily. 16 g 6   Glycerin-Hypromellose-PEG 400 (VISINE DRY EYE) 0.2-0.2-1 % SOLN Place 1-2 drops into both eyes 4 (four) times daily as needed.     Krill Oil 1000 MG CAPS Take 1,000 mg by mouth daily.     Multiple Vitamin (MULTIVITAMIN WITH MINERALS) TABS tablet Take 1 tablet by mouth daily.     sildenafil (REVATIO) 20 MG tablet TAKE 2-5 TABLETS AS NEEDED PRIOR TO SEXUAL ACTIVITY 10 tablet 2   No current facility-administered medications on file prior to visit.    ROS Review of Systems  Constitutional:  Negative for fever.  Respiratory:  Negative for shortness of breath.   Cardiovascular:  Negative for chest pain.  Musculoskeletal:  Negative for arthralgias.  Skin:  Negative for rash.   Objective:  BP 124/68   Pulse 71   Temp 98.2 F (36.8 C)   Ht _0  (1.803 m)   Wt 172 lb 6.4 oz (78.2 kg)   SpO2 97%   BMI 24.04 kg/m   BP Readings from Last 3 Encounters:  08/07/21 124/68  05/07/21 138/66  01/28/21 136/62    Wt Readings from Last 3 Encounters:  08/07/21 172 lb 6.4 oz (78.2 kg)  05/07/21 168 lb 6.4 oz (76.4 kg)  01/28/21 170 lb 12.8 oz (77.5 kg)     Physical Exam Constitutional:      General: Peter Mathis is not in acute distress.    Appearance: Peter Mathis is well-developed.  HENT:     Head: Normocephalic and atraumatic.     Right Ear: External ear normal.     Left Ear: External ear normal.     Nose: Nose normal.  Eyes:     Conjunctiva/sclera: Conjunctivae normal.     Pupils: Pupils are equal, round, and reactive to light.  Cardiovascular:     Rate and Rhythm: Normal rate and regular rhythm.     Heart sounds: Normal heart sounds. No murmur heard. Pulmonary:     Effort: Pulmonary effort is normal. No respiratory distress.     Breath sounds: Normal breath sounds. No wheezing or  rales.  Abdominal:     Palpations: Abdomen is soft.     Tenderness: There is no abdominal tenderness.  Musculoskeletal:        General: Normal range of motion.     Cervical back: Normal range of motion and neck supple.  Skin:    General: Skin is warm and dry.  Neurological:     Mental Status: Peter Mathis is alert and oriented to person, place, and time.     Deep Tendon Reflexes: Reflexes are normal and symmetric.  Psychiatric:        Behavior: Behavior normal.        Thought Content: Thought content normal.        Judgment: Judgment normal.    Diabetic Foot Exam - Simple   No data filed       Assessment & Plan:   Peter Mathis was seen today for medical management of chronic issues.  Diagnoses and all orders for this visit:  Mixed hyperlipidemia -     Lipid panel  Type 2 diabetes mellitus without complication, without long-term current use of insulin (HCC) -     CBC with Differential/Platelet -     Bayer DCA Hb A1c Waived -     CMP14+EGFR  Other orders -     atorvastatin (LIPITOR) 80 MG tablet; TAKE 1/2 TABLET BY MOUTH EVERY DAY AT BEDTIME -     lisinopril (ZESTRIL) 5 MG tablet; Take 1 tablet (5 mg total) by mouth daily. -     metFORMIN (GLUCOPHAGE) 500 MG tablet; Take 2 tablets (1,000 mg total) by mouth 2 (two) times daily with a meal. -     sitaGLIPtin (JANUVIA) 100 MG tablet; Take 1 tablet (100 mg total) by mouth daily.  I have discontinued Peter Mathis's levofloxacin. I am also having him maintain his aspirin, Vitamin D3, OneTouch Verio, Krill Oil, multivitamin with minerals, Visine Dry Eye, fluticasone, sildenafil, atorvastatin, lisinopril, metFORMIN, and sitaGLIPtin.  Meds ordered this encounter  Medications   atorvastatin (LIPITOR) 80 MG tablet    Sig: TAKE 1/2 TABLET BY MOUTH EVERY DAY AT BEDTIME    Dispense:  45 tablet    Refill:  3   lisinopril (ZESTRIL) 5 MG tablet    Sig: Take 1 tablet (5 mg total) by mouth daily.    Dispense:  90 tablet    Refill:  3    metFORMIN (GLUCOPHAGE) 500 MG tablet    Sig: Take 2 tablets (1,000 mg total) by mouth 2 (two) times daily with a meal.    Dispense:  360 tablet    Refill:  3   sitaGLIPtin (JANUVIA) 100 MG tablet    Sig: Take 1 tablet (100 mg total) by mouth daily.    Dispense:  90 tablet    Refill:  1    EMERGENCY HOLDOVER PROVIDED: ZO#[id number]. RPH GAVE 3 JANUVIA 100 MG  TABLET. MD CONTACTED FOR AUTHORIZATION ON 02/13/2021     Follow-up: Return in about 3 months (around 11/07/2021).  [name], M.D.

## 2021-08-08 LAB — CBC WITH DIFFERENTIAL/PLATELET
Basophils Absolute: 0 10*3/uL (ref 0.0–0.2)
Basos: 0 %
EOS (ABSOLUTE): 0.1 10*3/uL (ref 0.0–0.4)
Eos: 1 %
Hematocrit: 44.4 % (ref 37.5–51.0)
Hemoglobin: 14.7 g/dL (ref 13.0–17.7)
Immature Grans (Abs): 0 10*3/uL (ref 0.0–0.1)
Immature Granulocytes: 0 %
Lymphocytes Absolute: 2.2 10*3/uL (ref 0.7–3.1)
Lymphs: 27 %
MCH: 29.8 pg (ref 26.6–33.0)
MCHC: 33.1 g/dL (ref 31.5–35.7)
MCV: 90 fL (ref 79–97)
Monocytes Absolute: 0.7 10*3/uL (ref 0.1–0.9)
Monocytes: 8 %
Neutrophils Absolute: 5.2 10*3/uL (ref 1.4–7.0)
Neutrophils: 64 %
Platelets: 247 10*3/uL (ref 150–450)
RBC: 4.93 x10E6/uL (ref 4.14–5.80)
RDW: 12.7 % (ref 11.6–15.4)
WBC: 8.2 10*3/uL (ref 3.4–10.8)

## 2021-08-08 LAB — LIPID PANEL
Chol/HDL Ratio: 2.8 ratio (ref 0.0–5.0)
Cholesterol, Total: 99 mg/dL — ABNORMAL LOW (ref 100–199)
HDL: 35 mg/dL — ABNORMAL LOW (ref >39)
LDL Chol Calc (NIH): 46 mg/dL (ref 0–99)
Triglycerides: 93 mg/dL (ref 0–149)
VLDL Cholesterol Cal: 18 mg/dL (ref 5–40)

## 2021-08-08 LAB — CMP14+EGFR
ALT: 15 IU/L (ref 0–44)
AST: 16 IU/L (ref 0–40)
Albumin/Globulin Ratio: 1.5 (ref 1.2–2.2)
Albumin: 4.1 g/dL (ref 3.7–4.7)
Alkaline Phosphatase: 48 IU/L (ref 44–121)
BUN/Creatinine Ratio: 19 (ref 10–24)
BUN: 18 mg/dL (ref 8–27)
Bilirubin Total: 0.4 mg/dL (ref 0.0–1.2)
CO2: 25 mmol/L (ref 20–29)
Calcium: 9.5 mg/dL (ref 8.6–10.2)
Chloride: 100 mmol/L (ref 96–106)
Creatinine, Ser: 0.94 mg/dL (ref 0.76–1.27)
Globulin, Total: 2.7 g/dL (ref 1.5–4.5)
Glucose: 173 mg/dL — ABNORMAL HIGH (ref 65–99)
Potassium: 5 mmol/L (ref 3.5–5.2)
Sodium: 137 mmol/L (ref 134–144)
Total Protein: 6.8 g/dL (ref 6.0–8.5)
eGFR: 87 mL/min/{1.73_m2} (ref >59)

## 2021-08-11 NOTE — Progress Notes (Signed)
Hello [place],  Your lab result is normal and/or stable.Some minor variations that are not significant are commonly marked abnormal, but do not represent any medical problem for you.  Best regards, Jaice Digioia, M.D.

## 2021-08-18 ENCOUNTER — Other Ambulatory Visit: Payer: Self-pay | Admitting: Family Medicine

## 2021-08-18 MED ORDER — ATORVASTATIN CALCIUM 40 MG PO TABS: 40.0000 mg | ORAL_TABLET | Freq: Every day | ORAL | 3 refills | Status: DC

## 2021-09-10 ENCOUNTER — Telehealth: Payer: PPO

## 2021-10-30 ENCOUNTER — Ambulatory Visit (INDEPENDENT_AMBULATORY_CARE_PROVIDER_SITE_OTHER): Payer: PPO | Admitting: Licensed Clinical Social Worker

## 2021-10-30 DIAGNOSIS — E119 Type 2 diabetes mellitus without complications: Secondary | ICD-10-CM

## 2021-10-30 DIAGNOSIS — E11319 Type 2 diabetes mellitus with unspecified diabetic retinopathy without macular edema: Secondary | ICD-10-CM

## 2021-10-30 DIAGNOSIS — N400 Enlarged prostate without lower urinary tract symptoms: Secondary | ICD-10-CM

## 2021-10-30 DIAGNOSIS — E782 Mixed hyperlipidemia: Secondary | ICD-10-CM

## 2021-10-30 DIAGNOSIS — E559 Vitamin D deficiency, unspecified: Secondary | ICD-10-CM

## 2021-10-30 NOTE — Chronic Care Management (AMB) (Signed)
Chronic Care Management    Clinical Social Work Note  10/30/2021 Name: KHORI ROSEVEAR MRN: [id number] DOB: 01/08/1951  [name] Upton is a 70 y.o. year old male who is a primary care patient of Stacks, Cletus Gash, MD. The CCM team was consulted to assist the patient with chronic disease management and/or care coordination needs related to: [name] .   Engaged with patient by telephone for follow up visit in response to provider referral for social work chronic care management and care coordination services.   Consent to Services:  The patient was given information about Chronic Care Management services, agreed to services, and gave verbal consent prior to initiation of services.  Please see initial visit note for detailed documentation.   Patient agreed to services and consent obtained.   Assessment: Review of patient past medical history, allergies, medications, and health status, including review of relevant consultants reports was performed today as part of a comprehensive evaluation and provision of chronic care management and care coordination services.     SDOH (Social Determinants of Health) assessments and interventions performed:  SDOH Interventions    Flowsheet Row Most Recent Value  SDOH Interventions   Financial Strain Interventions Other (Comment)  [encouraged client to call Dr. Lottie Dawson, [facility] Pharmacist to discuss client cost of diabetes medication]  Stress Interventions Provide Counseling  [client has stress related to paying for his Diabetes medication]  Depression Interventions/Treatment  --  [informed client of LCSW support and of RNCM support]        Advanced Directives Status: See Vynca application for related entries.  CCM Care Plan  Allergies  Allergen Reactions   Penicillins Rash    Did it involve swelling of the face/tongue/throat, SOB, or low BP? No Did it involve sudden or severe rash/hives, skin peeling, or any reaction on the inside of  your mouth or nose? no Did you need to seek medical attention at a hospital or doctor's office? No When did it last happen?     50 years ago  If all above answers are "NO", may proceed with cephalosporin use.     Outpatient Encounter Medications as of 10/30/2021  Medication Sig   aspirin 81 MG EC tablet Take 81 mg by mouth daily.   atorvastatin (LIPITOR) 40 MG tablet Take 1 tablet (40 mg total) by mouth daily. TAKE 1/2 TABLET BY MOUTH EVERY DAY AT BEDTIME   Blood Glucose Monitoring Suppl (ONETOUCH VERIO) w/Device KIT 1 Stick by Does not apply route 2 (two) times daily.   Cholecalciferol (VITAMIN D3) 5000 UNITS CAPS Take 5,000 Units by mouth daily.   fluticasone (FLONASE) 50 MCG/ACT nasal spray Place 2 sprays into both nostrils daily.   Glycerin-Hypromellose-PEG 400 (VISINE DRY EYE) 0.2-0.2-1 % SOLN Place 1-2 drops into both eyes 4 (four) times daily as needed.   Krill Oil 1000 MG CAPS Take 1,000 mg by mouth daily.   lisinopril (ZESTRIL) 5 MG tablet Take 1 tablet (5 mg total) by mouth daily.   metFORMIN (GLUCOPHAGE) 500 MG tablet Take 2 tablets (1,000 mg total) by mouth 2 (two) times daily with a meal.   Multiple Vitamin (MULTIVITAMIN WITH MINERALS) TABS tablet Take 1 tablet by mouth daily.   sildenafil (REVATIO) 20 MG tablet TAKE 2-5 TABLETS AS NEEDED PRIOR TO SEXUAL ACTIVITY   sitaGLIPtin (JANUVIA) 100 MG tablet Take 1 tablet (100 mg total) by mouth daily.   No facility-administered encounter medications on file as of 10/30/2021.    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   Seborrheic keratosis 01/12/2017   Erectile dysfunction due to arterial insufficiency 01/23/2016    Right bundle branch block, history of 01/17/2014   Hyperlipidemia associated with type 2 diabetes mellitus ([place]) 07/12/2013   Type II diabetes mellitus ([place]) 07/12/2013   Benign prostatic hyperplasia without lower urinary tract symptoms 07/12/2013    Conditions to be addressed/monitored: monitor client completion of ADLs; monitor client management of vision issues  Care Plan : LCSW Care Plan  Updates made by Katha Cabal, LCSW since 10/30/2021 12:00 AM     Problem: Coping Skills (General Plan of Care)      Goal: Coping Skills Enhanced: Manage ADL completion and manage vision issues   Start Date: 10/30/2021  Expected End Date: 01/29/2022  This Visit's Progress: On track  Recent Progress: On track  Priority: Medium  Note:   Current barriers:   Patient in need of assistance with connecting to community resources for possible help with ADL completion and with vision issues of client Challenges in managing medical needs Some financial challenges  Clinical Goals:  patient will work with SW monthly to address concerns related to ADLs completion of client and vision challenges of client Client will attend all scheduled client medical appointments in next 30 days  Patient will call RNCM as needed in next 30 days to discuss nursing needs of client  Clinical Interventions:  Collaboration with [name], MD regarding development and update of comprehensive plan of care as evidenced by provider attestation and co-signature Assessment of needs, barriers  of client Discussed vision needs of client with Timon. He said he goes one time each year for eye exam.   Reviewed sleeping issues of client and reviewed pain issues of client.  Reviewed medication procurement with Davian. He said that his Diabetes medication is more expensive that other medications he receives. LCSW encouraged him to talk with Dr. Lottie Dawson, [facility] pharmacist about cost of his Diabetic medication Discussed ADLs  completion with client (he said he is having no problem completing ADLs) Reviewed CCM program support with client. Discussed ambulation of client. He said he is walking well.  He enjoys outdoor activities such as caring for the yard. 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 Reviewed upcoming appointments for client.  He said he has an appointment scheduled with Dr. [name] for November 10, 2021 Encouraged client to call [facility] as needed for CCM nursing support  Patient Coping Skills: Completes ADLs daily, as able Attends scheduled medical appointments Takes medications as prescribed  Patient Deficits: Vision challenges  Patient Goals:  In next 30 days,patient will: Call RNCM or LCSW as needed for CCM program support Attend scheduled medical appointments Talk with jemaine, prokop, regularly, about client needs -  Follow Up Plan: LCSW to call client on 12/26/21 at 11:15 AM to discuss needs of client      [name].Benjamin Merrihew MSW, LCSW Licensed Clinical Social Worker [facility] Care Management [phone]

## 2021-10-30 NOTE — Patient Instructions (Addendum)
Visit Information  Patient Goal:  Manage emotions; Complete ADLs daily as able.  Manage vision issues  Timeframe:  Short-Term Goal Priority:  Medium Progress: On Track Start Date:            10/30/21                Expected End Date:          01/29/22            Follow Up Date 12/26/21 at 11:15 AM   Manage emotions: Complete ADLs daily as able . Manage vision issues   Why is this important?   When you are stressed, down or upset, your body reacts too.  For example, your blood pressure may get higher; you may have a headache or stomachache.  When your emotions get the best of you, your body's ability to fight off cold and flu gets weak.  These steps will help you manage your emotions.     Patient Coping Skills: Completes ADLs daily, as able Attends scheduled medical appointments Takes medications as prescribed  Patient Deficits: Vision challenges  Patient Goals:  In next 30 days,patient will: Call RNCM or LCSW as needed for CCM program support Attend scheduled medical appointments Talk with fontaine, hehl, regularly, about client needs -  Follow Up Plan: LCSW to call client on 12/26/21 at 11:15 AM to assess client needs at that time   [name].Ojas Coone MSW, LCSW Licensed Clinical Social Worker [facility] Care Management [phone]

## 2021-10-31 ENCOUNTER — Ambulatory Visit (INDEPENDENT_AMBULATORY_CARE_PROVIDER_SITE_OTHER): Payer: PPO

## 2021-10-31 VITALS — Ht 71.0 in | Wt 170.0 lb

## 2021-10-31 DIAGNOSIS — Z599 Problem related to housing and economic circumstances, unspecified: Secondary | ICD-10-CM | POA: Diagnosis not present

## 2021-10-31 DIAGNOSIS — Z0000 Encounter for general adult medical examination without abnormal findings: Secondary | ICD-10-CM

## 2021-10-31 NOTE — Progress Notes (Signed)
Subjective:   Peter Mathis is a 70 y.o. male who presents for Medicare Annual/Subsequent preventive examination.  Virtual Visit via Telephone Note  I connected with  Peter Mathis on 10/31/21 at  2:45 PM EDT by telephone and verified that I am speaking with the correct person using two identifiers.  Location: Patient: Home Provider: [facility] Persons participating in the virtual visit: patient/Nurse Health Advisor   I discussed the limitations, risks, security and privacy concerns of performing an evaluation and management service by telephone and the availability of in person appointments. The patient expressed understanding and agreed to proceed.  Interactive audio and video telecommunications were attempted between this nurse and patient, however failed, due to patient having technical difficulties OR patient did not have access to video capability.  We continued and completed visit with audio only.  Some vital signs may be absent or patient reported.   Adlynn Lowenstein E Yanni Quiroa, LPN    Review of Systems     Cardiac Risk Factors include: advanced age (>5mn, >>65women);male gender;diabetes mellitus;dyslipidemia;hypertension     Objective:    Today's Vitals   10/31/21 1404 10/31/21 1406  Weight: 170 lb (77.1 kg)   Height: '5\' 11"'  (1.803 m)   PainSc:  0-No pain   Body mass index is 23.71 kg/m.  Advanced Directives 10/31/2021 04/11/2020 04/03/2020 04/01/2020 12/01/2017 07/02/2016  Does Patient Have a Medical Advance Directive? No No No No No No  Does patient want to make changes to medical advance directive? No - Patient declined - - - - -  Would patient like information on creating a medical advance directive? No - Patient declined No - Patient declined No - Patient declined No - Patient declined Yes (ED - Information included in AVS) No - patient declined information    Current Medications (verified) Outpatient Encounter Medications as of 10/31/2021  Medication Sig   aspirin 81 MG EC  tablet Take 81 mg by mouth daily.   atorvastatin (LIPITOR) 40 MG tablet Take 1 tablet (40 mg total) by mouth daily. TAKE 1/2 TABLET BY MOUTH EVERY DAY AT BEDTIME   Blood Glucose Monitoring Suppl (ONETOUCH VERIO) w/Device KIT 1 Stick by Does not apply route 2 (two) times daily.   Cholecalciferol (VITAMIN D3) 5000 UNITS CAPS Take 5,000 Units by mouth daily.   fluticasone (FLONASE) 50 MCG/ACT nasal spray Place 2 sprays into both nostrils daily.   Glycerin-Hypromellose-PEG 400 (VISINE DRY EYE) 0.2-0.2-1 % SOLN Place 1-2 drops into both eyes 4 (four) times daily as needed.   Krill Oil 1000 MG CAPS Take 1,000 mg by mouth daily.   lisinopril (ZESTRIL) 5 MG tablet Take 1 tablet (5 mg total) by mouth daily.   metFORMIN (GLUCOPHAGE) 500 MG tablet Take 2 tablets (1,000 mg total) by mouth 2 (two) times daily with a meal.   Multiple Vitamin (MULTIVITAMIN WITH MINERALS) TABS tablet Take 1 tablet by mouth daily.   sildenafil (REVATIO) 20 MG tablet TAKE 2-5 TABLETS AS NEEDED PRIOR TO SEXUAL ACTIVITY   sitaGLIPtin (JANUVIA) 100 MG tablet Take 1 tablet (100 mg total) by mouth daily.   No facility-administered encounter medications on file as of 10/31/2021.    Allergies (verified) Penicillins   History: Past Medical History:  Diagnosis Date   Chest discomfort 1989   Diverticulosis    DM type 2 (diabetes mellitus, type 2) (HCC)    Heart murmur    History of kidney stones    Hyperlipidemia    Past Surgical History:  Procedure Laterality  Date   GANGLION CYST EXCISION Right    INGUINAL HERNIA REPAIR Left 04/03/2020   Procedure: HERNIA REPAIR INGUINAL ADULT WITH MESH;  Surgeon: Virl Cagey, MD;  Location: [facility];  Service: General;  Laterality: Left;   TONSILLECTOMY AND ADENOIDECTOMY  01/1955   Family History  Problem Relation Age of Onset   Diabetes Mother    Heart attack Father    Colon cancer Neg Hx    Social History   Socioeconomic History   Marital status: Married    Spouse name: Not on  file   Number of children: Not on file   Years of education: Not on file   Highest education level: Not on file  Occupational History   Occupation: retired  Tobacco Use   Smoking status: Never   Smokeless tobacco: Never  Vaping Use   Vaping Use: Never used  Substance and Sexual Activity   Alcohol use: No   Drug use: No   Sexual activity: Yes  Other Topics Concern   Not on file  Social History Narrative   Lives in one level home with basement.   Both parents live next door   Daughters and grandchildren visit often   Still very active and independent   Social Determinants of Health   Financial Resource Strain: Medium Risk   Difficulty of Paying Living Expenses: Somewhat hard  Food Insecurity: No Food Insecurity   Worried About Charity fundraiser in the Last Year: Never true   Ran Out of Food in the Last Year: Never true  Transportation Needs: No Transportation Needs   Lack of Transportation (Medical): No   Lack of Transportation (Non-Medical): No  Physical Activity: Sufficiently Active   Days of Exercise per Week: 5 days   Minutes of Exercise per Session: 30 min  Stress: Stress Concern Present   Feeling of Stress : To some extent  Social Connections: Engineer, building services of Communication with Friends and Family: More than three times a week   Frequency of Social Gatherings with Friends and Family: More than three times a week   Attends Religious Services: More than 4 times per year   Active Member of [employer] or Organizations: Yes   Attends Music therapist: More than 4 times per year   Marital Status: Married    Tobacco Counseling Counseling given: Not Answered   Clinical Intake:  Pre-visit preparation completed: Yes  Pain : No/denies pain Pain Score: 0-No pain     Nutritional Status: BMI of 19-24  Normal Nutritional Risks: None Diabetes: Yes CBG done?: No Did pt. bring in CBG monitor from home?: No  How often do you need to have  someone help you when you read instructions, pamphlets, or other written materials from your doctor or pharmacy?: 1 - Never  Diabetic? Yes Nutrition Risk Assessment:  Has the patient had any N/V/D within the last 2 months?  No  Does the patient have any non-healing wounds?  No  Has the patient had any unintentional weight loss or weight gain?  No   Diabetes:  Is the patient diabetic?  Yes  If diabetic, was a CBG obtained today?  No  Did the patient bring in their glucometer from home?  No  How often do you monitor your CBG's? Once per week.   Financial Strains and Diabetes Management:  Are you having any financial strains with the device, your supplies or your medication? Yes .  Does the patient want to be  seen by Chronic Care Management for management of their diabetes?  Yes  Would the patient like to be referred to a Nutritionist or for Diabetic Management?  No   Diabetic Exams:  Diabetic Eye Exam: Completed 10/23/2020. Overdue for diabetic eye exam. Pt has been advised about the importance in completing this exam. He has an appt this month  Diabetic Foot Exam: Completed 05/07/2021. Pt has been advised about the importance in completing this exam. Pt is scheduled for diabetic foot exam on next year.    Interpreter Needed?: No  Information entered by :: Kenishia Plack, LPN   Activities of Daily Living In your present state of health, do you have any difficulty performing the following activities: 10/31/2021  Hearing? N  Vision? N  Difficulty concentrating or making decisions? N  Walking or climbing stairs? N  Dressing or bathing? N  Doing errands, shopping? N  Preparing Food and eating ? N  Using the Toilet? N  In the past six months, have you accidently leaked urine? N  Do you have problems with loss of bowel control? N  Managing your Medications? N  Managing your Finances? N  Housekeeping or managing your Housekeeping? N  Some recent data might be hidden    Patient  Care Team: [name], MD as PCP - General (Family Medicine) Larey Dresser, MD as Consulting Physician (Cardiology) Shea Evans [name], LCSW as [place] (Licensed Clinical Social Worker)  Indicate any recent [place] you may have received from other than [facility] providers in the past year (date may be approximate).     Assessment:   This is a routine wellness examination for [place].  Hearing/Vision screen Hearing Screening - Comments:: Denies hearing difficulties  Vision Screening - Comments:: Wears transitional lenses - up to date with annual eye exams with  Dr Eulas Post in [place] issues and exercise activities discussed: Current Exercise Habits: Home exercise routine, Type of exercise: Other - see comments;walking (cutting wood, yard work, Social research officer, government.), Time (Minutes): 30, Frequency (Times/Week): 5, Weekly Exercise (Minutes/Week): 150, Intensity: Moderate, Exercise limited by: None identified   Goals Addressed             This Visit's Progress    DIET - INCREASE WATER INTAKE   Not on track    Increase water intake to 6-8 glasses per day.     Exercise 150 min/wk Moderate Activity   On track    Join silver sneakers and attend 3 times per week for 1 hour.       Depression Screen PHQ 2/9 Scores 10/31/2021 10/30/2021 08/07/2021 06/18/2021 05/07/2021 01/28/2021 10/28/2020  PHQ - 2 Score 2 2 0 2 0 0 0  PHQ- 9 Score 4 4 - 4 - - -    Fall Risk Fall Risk  10/31/2021 08/07/2021 05/07/2021 01/28/2021 10/28/2020  Falls in the past year? 0 0 0 0 0  Number falls in past yr: 0 - - - -  Injury with Fall? 0 - - - -  Risk for fall due to : No Fall Risks - - No Fall Risks -  Follow up Falls prevention discussed - - Falls evaluation completed Falls evaluation completed    [place]:  Any stairs in or around the home? Yes  If so, are there any without handrails? No  Home free of loose throw rugs in walkways, pet beds,  electrical cords, etc? Yes  Adequate lighting in your home to  reduce risk of falls? Yes   ASSISTIVE DEVICES UTILIZED TO PREVENT FALLS:  Life alert? No  Use of a cane, walker or w/c? No  Grab bars in the bathroom? No  Shower chair or bench in shower? No  Elevated toilet seat or a handicapped toilet? No   TIMED UP AND GO:  Was the test performed? No . Telephonic visit.  Cognitive Function: Normal cognitive status assessed by direct observation by this Nurse Health Advisor. No abnormalities found.    MMSE - Mini Mental State Exam 12/01/2017  Orientation to time 5  Orientation to Place 5  Registration 3  Attention/ Calculation 5  Recall 3  Language- name 2 objects 2  Language- repeat 1  Language- follow 3 step command 3  Language- read & follow direction 1  Write a sentence 1  Copy design 1  Total score 30     6CIT Screen 10/31/2021  What Year? 0 points  What month? 0 points  What time? 0 points  Count back from 20 0 points  Months in reverse 0 points  Repeat phrase 0 points  Total Score 0    Immunizations Immunization History  Administered Date(s) Administered   Fluad Quad(high Dose 65+) 10/04/2019, 10/28/2020   Influenza, High Dose Seasonal PF 11/15/2015, 10/21/2016, 10/12/2017, 10/04/2018   Moderna Sars-Covid-2 Vaccination 02/02/2020, 03/02/2020, 11/28/2020   Pneumococcal Conjugate-13 05/02/2015   Pneumococcal Polysaccharide-23 07/26/2017   Tdap 05/05/2016   Zoster Recombinat (Shingrix) 09/07/2017, 01/25/2018    TDAP status: Up to date  Flu Vaccine status: Due, Education has been provided regarding the importance of this vaccine. Advised may receive this vaccine at local pharmacy or Health Dept. Aware to provide a copy of the vaccination record if obtained from local pharmacy or Health Dept. Verbalized acceptance and understanding.  Pneumococcal vaccine status: Up to date  Covid-19 vaccine status: Completed vaccines  Qualifies for Shingles Vaccine? Yes    Zostavax completed Yes   Shingrix Completed?: Yes  Screening Tests Health Maintenance  Topic Date Due   OPHTHALMOLOGY EXAM  01/07/2021   COVID-19 Vaccine (4 - Booster for Moderna series) 01/23/2021   INFLUENZA VACCINE  07/28/2021   HEMOGLOBIN A1C  02/07/2022   FOOT EXAM  05/07/2022   TETANUS/TDAP  05/05/2026   COLONOSCOPY (Pts 45-4yr Insurance coverage will need to be confirmed)  07/22/2026   Pneumonia Vaccine 70 Years old  Completed   Hepatitis C Screening  Completed   Zoster Vaccines- Shingrix  Completed   HPV VACCINES  Aged Out    Health Maintenance  Health Maintenance Due  Topic Date Due   OPHTHALMOLOGY EXAM  01/07/2021   COVID-19 Vaccine (4 - Booster for Moderna series) 01/23/2021   INFLUENZA VACCINE  07/28/2021    Colorectal cancer screening: Type of screening: Colonoscopy. Completed 07/22/2016. Repeat every 10 years  Lung Cancer Screening: (Low Dose CT Chest recommended if Age 71-80years, 30 pack-year currently smoking OR have quit w/in 15years.) does not qualify.   Additional Screening:  Hepatitis C Screening: does qualify; Completed 07/26/2017  Vision Screening: Recommended annual ophthalmology exams for early detection of glaucoma and other disorders of the eye. Is the patient up to date with their annual eye exam?  Yes  Who is the provider or what is the name of the office in which the patient attends annual eye exams[facility] is not [facility] with a provider, would they like to be referred to a provider to establish care? No .  Dental Screening: Recommended annual dental exams for proper oral hygiene  Community Resource Referral / Chronic Care Management: CRR required this visit?  No   CCM required this visit?  No      Plan:     I have personally reviewed and noted the following in the patient's chart:   Medical and social history Use of alcohol, tobacco or illicit drugs  Current medications and supplements including  opioid prescriptions. Patient is not currently taking opioid prescriptions. Functional ability and status Nutritional status Physical activity Advanced directives List of other physicians Hospitalizations, surgeries, and ER visits in previous 12 months Vitals Screenings to include cognitive, depression, and falls Referrals and appointments  In addition, I have reviewed and discussed with patient certain preventive protocols, quality metrics, and best practice recommendations. A written personalized care plan for preventive services as well as general preventive health recommendations were provided to patient.     Sandrea Hammond, LPN   54/07/3233   Nurse Notes: Referred to CCM to help with cost of Januvia.

## 2021-10-31 NOTE — Patient Instructions (Signed)
[name] , Thank you for taking time to come for your Medicare Wellness Visit. I appreciate your ongoing commitment to your health goals. Please review the following plan we discussed and let me know if I can assist you in the future.   Screening recommendations/referrals: Colonoscopy: Done 07/22/2016 - Repeat in 10 years Recommended yearly ophthalmology/optometry visit for glaucoma screening and checkup Recommended yearly dental visit for hygiene and checkup  Vaccinations: Influenza vaccine: Done 10/28/2020 - Repeat annually *due Pneumococcal vaccine: Done 05/02/2015 & 07/26/2017 Tdap vaccine: Done 05/05/2016 - Repeat in 10 years  Shingles vaccine: Done   09/07/2017 & 01/25/2018 Covid-19: Done 02/02/2020, 03/02/2020 & 11/28/2020  Advanced directives: Advance directive discussed with you today. Even though you declined this today, please call our office should you change your mind, and we can give you the proper paperwork for you to fill out.   Conditions/risks identified: Aim for 30 minutes of exercise or brisk walking each day, drink 6-8 glasses of water and eat lots of fruits and vegetables.   Next appointment: Follow up in one year for your annual wellness visit.   Preventive Care 16 Years and Older, Male  Preventive care refers to lifestyle choices and visits with your health care provider that can promote health and wellness. What does preventive care include? A yearly physical exam. This is also called an annual well check. Dental exams once or twice a year. Routine eye exams. Ask your health care provider how often you should have your eyes checked. Personal lifestyle choices, including: Daily care of your teeth and gums. Regular physical activity. Eating a healthy diet. Avoiding tobacco and drug use. Limiting alcohol use. Practicing safe sex. Taking low doses of aspirin every day. Taking vitamin and mineral supplements as recommended by your health care provider. What happens during  an annual well check? The services and screenings done by your health care provider during your annual well check will depend on your age, overall health, lifestyle risk factors, and family history of disease. Counseling  Your health care provider may ask you questions about your: Alcohol use. Tobacco use. Drug use. Emotional well-being. Home and relationship well-being. Sexual activity. Eating habits. History of falls. Memory and ability to understand (cognition). Work and work Statistician. Screening  You may have the following tests or measurements: Height, weight, and BMI. Blood pressure. Lipid and cholesterol levels. These may be checked every 5 years, or more frequently if you are over 48 years old. Skin check. Lung cancer screening. You may have this screening every year starting at age 23 if you have a 30-pack-year history of smoking and currently smoke or have quit within the past 15 years. Fecal occult blood test (FOBT) of the stool. You may have this test every year starting at age 57. Flexible sigmoidoscopy or colonoscopy. You may have a sigmoidoscopy every 5 years or a colonoscopy every 10 years starting at age 66. Prostate cancer screening. Recommendations will vary depending on your family history and other risks. Hepatitis C blood test. Hepatitis B blood test. Sexually transmitted disease (STD) testing. Diabetes screening. This is done by checking your blood sugar (glucose) after you have not eaten for a while (fasting). You may have this done every 1-3 years. Abdominal aortic aneurysm (AAA) screening. You may need this if you are a current or former smoker. Osteoporosis. You may be screened starting at age 26 if you are at high risk. Talk with your health care provider about your test results, treatment options, and if  necessary, the need for more tests. Vaccines  Your health care provider may recommend certain vaccines, such as: Influenza vaccine. This is recommended  every year. Tetanus, diphtheria, and acellular pertussis (Tdap, Td) vaccine. You may need a Td booster every 10 years. Zoster vaccine. You may need this after age 16. Pneumococcal 13-valent conjugate (PCV13) vaccine. One dose is recommended after age 59. Pneumococcal polysaccharide (PPSV23) vaccine. One dose is recommended after age 9. Talk to your health care provider about which screenings and vaccines you need and how often you need them. This information is not intended to replace advice given to you by your health care provider. Make sure you discuss any questions you have with your health care provider. Document Released: 01/10/2016 Document Revised: 09/02/2016 Document Reviewed: 10/15/2015 Elsevier Interactive Patient Education  2017 [employer] Prevention in the Home Falls can cause injuries. They can happen to people of all ages. There are many things you can do to make your home safe and to help prevent falls. What can I do on the outside of my home? Regularly fix the edges of walkways and driveways and fix any cracks. Remove anything that might make you trip as you walk through a door, such as a raised step or threshold. Trim any bushes or trees on the path to your home. Use bright outdoor lighting. Clear any walking paths of anything that might make someone trip, such as rocks or tools. Regularly check to see if handrails are loose or broken. Make sure that both sides of any steps have handrails. Any raised decks and porches should have guardrails on the edges. Have any leaves, snow, or ice cleared regularly. Use sand or salt on walking paths during winter. Clean up any spills in your garage right away. This includes oil or grease spills. What can I do in the bathroom? Use night lights. Install grab bars by the toilet and in the tub and shower. Do not use towel bars as grab bars. Use non-skid mats or decals in the tub or shower. If you need to sit down in the shower,  use a plastic, non-slip stool. Keep the floor dry. Clean up any water that spills on the floor as soon as it happens. Remove soap buildup in the tub or shower regularly. Attach bath mats securely with double-sided non-slip rug tape. Do not have throw rugs and other things on the floor that can make you trip. What can I do in the bedroom? Use night lights. Make sure that you have a light by your bed that is easy to reach. Do not use any sheets or blankets that are too big for your bed. They should not hang down onto the floor. Have a firm chair that has side arms. You can use this for support while you get dressed. Do not have throw rugs and other things on the floor that can make you trip. What can I do in the kitchen? Clean up any spills right away. Avoid walking on wet floors. Keep items that you use a lot in easy-to-reach places. If you need to reach something above you, use a strong step stool that has a grab bar. Keep electrical cords out of the way. Do not use floor polish or wax that makes floors slippery. If you must use wax, use non-skid floor wax. Do not have throw rugs and other things on the floor that can make you trip. What can I do with my stairs? Do not leave any items  on the stairs. Make sure that there are handrails on both sides of the stairs and use them. Fix handrails that are broken or loose. Make sure that handrails are as long as the stairways. Check any carpeting to make sure that it is firmly attached to the stairs. Fix any carpet that is loose or worn. Avoid having throw rugs at the top or bottom of the stairs. If you do have throw rugs, attach them to the floor with carpet tape. Make sure that you have a light switch at the top of the stairs and the bottom of the stairs. If you do not have them, ask someone to add them for you. What else can I do to help prevent falls? Wear shoes that: Do not have high heels. Have rubber bottoms. Are comfortable and fit you  well. Are closed at the toe. Do not wear sandals. If you use a stepladder: Make sure that it is fully opened. Do not climb a closed stepladder. Make sure that both sides of the stepladder are locked into place. Ask someone to hold it for you, if possible. Clearly mark and make sure that you can see: Any grab bars or handrails. First and last steps. Where the edge of each step is. Use tools that help you move around (mobility aids) if they are needed. These include: Canes. Walkers. Scooters. Crutches. Turn on the lights when you go into a dark area. Replace any light bulbs as soon as they burn out. Set up your furniture so you have a clear path. Avoid moving your furniture around. If any of your floors are uneven, fix them. If there are any pets around you, be aware of where they are. Review your medicines with your doctor. Some medicines can make you feel dizzy. This can increase your chance of falling. Ask your doctor what other things that you can do to help prevent falls. This information is not intended to replace advice given to you by your health care provider. Make sure you discuss any questions you have with your health care provider. Document Released: 10/10/2009 Document Revised: 05/21/2016 Document Reviewed: 01/18/2015 Elsevier Interactive Patient Education  2017 [employer].

## 2021-11-10 ENCOUNTER — Ambulatory Visit (INDEPENDENT_AMBULATORY_CARE_PROVIDER_SITE_OTHER): Payer: PPO | Admitting: Family Medicine

## 2021-11-10 ENCOUNTER — Other Ambulatory Visit: Payer: Self-pay

## 2021-11-10 ENCOUNTER — Encounter: Payer: Self-pay | Admitting: Family Medicine

## 2021-11-10 VITALS — BP 152/65 | HR 74 | Temp 97.9°F | Ht 71.0 in | Wt 173.4 lb

## 2021-11-10 DIAGNOSIS — E119 Type 2 diabetes mellitus without complications: Secondary | ICD-10-CM

## 2021-11-10 DIAGNOSIS — Z23 Encounter for immunization: Secondary | ICD-10-CM

## 2021-11-10 DIAGNOSIS — E782 Mixed hyperlipidemia: Secondary | ICD-10-CM

## 2021-11-10 LAB — BAYER DCA HB A1C WAIVED: HB A1C (BAYER DCA - WAIVED): 7.2 % — ABNORMAL HIGH (ref 4.8–5.6)

## 2021-11-10 MED ORDER — GLIPIZIDE 5 MG PO TABS: 5.0000 mg | ORAL_TABLET | Freq: Every day | ORAL | 1 refills | Status: DC

## 2021-11-10 MED ORDER — SILDENAFIL CITRATE 20 MG PO TABS: ORAL_TABLET | ORAL | 3 refills | Status: DC

## 2021-11-10 NOTE — Progress Notes (Signed)
Subjective:  Patient ID: Peter Mathis,  male    DOB: 1951-08-17  Age: 70 y.o.    CC: Medical Management of Chronic Issues   HPI Peter Mathis presents for  follow-up of hypertension. Patient has no history of headache chest pain or shortness of breath or recent cough. Patient also denies symptoms of TIA such as numbness weakness lateralizing. Patient denies side effects from medication. States taking it regularly.  Patient also  in for follow-up of elevated cholesterol. Doing well without complaints on current medication. Denies side effects  including myalgia and arthralgia and nausea. Also in today for liver function testing. Currently no chest pain, shortness of breath or other cardiovascular related symptoms noted.  Follow-up of diabetes. Patient does check blood sugar at home. About the same as always. 140-160 fasting. 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 Just had their 75th anniversary.  Patient denies symptoms such as excessive hunger or urinary frequency, excessive hunger, nausea No significant hypoglycemic spells noted. Medications reviewed. Pt reports taking them regularly. Pt. denies complication/adverse reaction today.    History Peter Mathis has a past medical history of Chest discomfort (1989), Diverticulosis, DM type 2 (diabetes mellitus, type 2) ([place]), Heart murmur, History of kidney stones, and Hyperlipidemia.   Peter Mathis has a past surgical history that includes Tonsillectomy and adenoidectomy (01/1955); Ganglion cyst excision (Right); and Inguinal hernia repair (Left, 04/03/2020).   His family history includes Diabetes in his mother; Heart attack in his father.Peter Mathis reports that Peter Mathis has never smoked. Peter Mathis has never used smokeless tobacco. Peter Mathis reports that Peter Mathis does not drink alcohol and does not use drugs.  Current Outpatient Medications on File Prior to Visit  Medication Sig Dispense Refill   aspirin 81 MG EC tablet Take 81 mg by mouth daily.     atorvastatin  (LIPITOR) 40 MG tablet Take 1 tablet (40 mg total) by mouth daily. TAKE 1/2 TABLET BY MOUTH EVERY DAY AT BEDTIME 90 tablet 3   Blood Glucose Monitoring Suppl (ONETOUCH VERIO) w/Device KIT 1 Stick by Does not apply route 2 (two) times daily. 1 kit 0   Cholecalciferol (VITAMIN D3) 5000 UNITS CAPS Take 5,000 Units by mouth daily.     fluticasone (FLONASE) 50 MCG/ACT nasal spray Place 2 sprays into both nostrils daily. 16 g 6   Glycerin-Hypromellose-PEG 400 (VISINE DRY EYE) 0.2-0.2-1 % SOLN Place 1-2 drops into both eyes 4 (four) times daily as needed.     Krill Oil 1000 MG CAPS Take 1,000 mg by mouth daily.     lisinopril (ZESTRIL) 5 MG tablet Take 1 tablet (5 mg total) by mouth daily. 90 tablet 3   metFORMIN (GLUCOPHAGE) 500 MG tablet Take 2 tablets (1,000 mg total) by mouth 2 (two) times daily with a meal. 360 tablet 3   Multiple Vitamin (MULTIVITAMIN WITH MINERALS) TABS tablet Take 1 tablet by mouth daily.     sitaGLIPtin (JANUVIA) 100 MG tablet Take 1 tablet (100 mg total) by mouth daily. 90 tablet 1   No current facility-administered medications on file prior to visit.    ROS Review of Systems  Constitutional:  Negative for fever.  Respiratory:  Negative for shortness of breath.   Cardiovascular:  Negative for chest pain.  Musculoskeletal:  Negative for arthralgias.  Skin:  Negative for rash.   Objective:  BP (!) 152/65   Pulse 74   Temp 97.9 F (36.6 C)   Ht '5\' 11"'  (1.803 m)   Wt 173  lb 6.4 oz (78.7 kg)   SpO2 98%   BMI 24.18 kg/m   BP Readings from Last 3 Encounters:  11/10/21 (!) 152/65  08/07/21 124/68  05/07/21 138/66    Wt Readings from Last 3 Encounters:  11/10/21 173 lb 6.4 oz (78.7 kg)  10/31/21 170 lb (77.1 kg)  08/07/21 172 lb 6.4 oz (78.2 kg)     Physical Exam Constitutional:      General: Peter Mathis is not in acute distress.    Appearance: Peter Mathis is well-developed.  HENT:     Head: Normocephalic and atraumatic.     Right Ear: External ear normal.     Left Ear:  External ear normal.     Nose: Nose normal.  Eyes:     Conjunctiva/sclera: Conjunctivae normal.     Pupils: Pupils are equal, round, and reactive to light.  Cardiovascular:     Rate and Rhythm: Normal rate and regular rhythm.     Heart sounds: Normal heart sounds. No murmur heard. Pulmonary:     Effort: Pulmonary effort is normal. No respiratory distress.     Breath sounds: Normal breath sounds. No wheezing or rales.  Abdominal:     Palpations: Abdomen is soft.     Tenderness: There is no abdominal tenderness.  Musculoskeletal:        General: Normal range of motion.     Cervical back: Normal range of motion and neck supple.  Skin:    General: Skin is warm and dry.  Neurological:     Mental Status: Peter Mathis is alert and oriented to person, place, and time.     Deep Tendon Reflexes: Reflexes are normal and symmetric.  Psychiatric:        Behavior: Behavior normal.        Thought Content: Thought content normal.        Judgment: Judgment normal.    Diabetic Foot Exam - Simple   No data filed       Assessment & Plan:   Peter Mathis was seen today for medical management of chronic issues.  Diagnoses and all orders for this visit:  Mixed hyperlipidemia -     Lipid panel  Type 2 diabetes mellitus without complication, without long-term current use of insulin (HCC) -     Bayer DCA Hb A1c Waived -     CBC with Differential/Platelet -     CMP14+EGFR  Other orders -     glipiZIDE (GLUCOTROL) 5 MG tablet; Take 1 tablet (5 mg total) by mouth daily before breakfast. -     sildenafil (REVATIO) 20 MG tablet; TAKE 2-5 TABLETS AS NEEDED PRIOR TO SEXUAL ACTIVITY  I am having Peter Mathis start on glipiZIDE. I am also having him maintain his aspirin, Vitamin D3, OneTouch Verio, Krill Oil, multivitamin with minerals, Visine Dry Eye, fluticasone, lisinopril, metFORMIN, sitaGLIPtin, atorvastatin, and sildenafil.  Meds ordered this encounter  Medications   glipiZIDE (GLUCOTROL) 5 MG  tablet    Sig: Take 1 tablet (5 mg total) by mouth daily before breakfast.    Dispense:  30 tablet    Refill:  1   sildenafil (REVATIO) 20 MG tablet    Sig: TAKE 2-5 TABLETS AS NEEDED PRIOR TO SEXUAL ACTIVITY    Dispense:  50 tablet    Refill:  3     Follow-up: Return in about 3 months (around 02/10/2022).  [name], M.D.

## 2021-11-11 LAB — LIPID PANEL
Chol/HDL Ratio: 2.4 ratio (ref 0.0–5.0)
Cholesterol, Total: 86 mg/dL — ABNORMAL LOW (ref 100–199)
HDL: 36 mg/dL — ABNORMAL LOW (ref >39)
LDL Chol Calc (NIH): 35 mg/dL (ref 0–99)
Triglycerides: 65 mg/dL (ref 0–149)
VLDL Cholesterol Cal: 15 mg/dL (ref 5–40)

## 2021-11-11 LAB — CMP14+EGFR
ALT: 20 IU/L (ref 0–44)
AST: 17 IU/L (ref 0–40)
Albumin/Globulin Ratio: 1.6 (ref 1.2–2.2)
Albumin: 4.2 g/dL (ref 3.7–4.7)
Alkaline Phosphatase: 49 IU/L (ref 44–121)
BUN/Creatinine Ratio: 11 (ref 10–24)
BUN: 10 mg/dL (ref 8–27)
Bilirubin Total: 0.5 mg/dL (ref 0.0–1.2)
CO2: 26 mmol/L (ref 20–29)
Calcium: 9.5 mg/dL (ref 8.6–10.2)
Chloride: 99 mmol/L (ref 96–106)
Creatinine, Ser: 0.89 mg/dL (ref 0.76–1.27)
Globulin, Total: 2.6 g/dL (ref 1.5–4.5)
Glucose: 178 mg/dL — ABNORMAL HIGH (ref 70–99)
Potassium: 4.8 mmol/L (ref 3.5–5.2)
Sodium: 138 mmol/L (ref 134–144)
Total Protein: 6.8 g/dL (ref 6.0–8.5)
eGFR: 92 mL/min/{1.73_m2} (ref >59)

## 2021-11-11 LAB — CBC WITH DIFFERENTIAL/PLATELET
Basophils Absolute: 0 10*3/uL (ref 0.0–0.2)
Basos: 1 %
EOS (ABSOLUTE): 0.2 10*3/uL (ref 0.0–0.4)
Eos: 2 %
Hematocrit: 45 % (ref 37.5–51.0)
Hemoglobin: 14.9 g/dL (ref 13.0–17.7)
Immature Grans (Abs): 0 10*3/uL (ref 0.0–0.1)
Immature Granulocytes: 0 %
Lymphocytes Absolute: 2.3 10*3/uL (ref 0.7–3.1)
Lymphs: 30 %
MCH: 28.7 pg (ref 26.6–33.0)
MCHC: 33.1 g/dL (ref 31.5–35.7)
MCV: 87 fL (ref 79–97)
Monocytes Absolute: 0.6 10*3/uL (ref 0.1–0.9)
Monocytes: 8 %
Neutrophils Absolute: 4.5 10*3/uL (ref 1.4–7.0)
Neutrophils: 59 %
Platelets: 282 10*3/uL (ref 150–450)
RBC: 5.19 x10E6/uL (ref 4.14–5.80)
RDW: 11.9 % (ref 11.6–15.4)
WBC: 7.7 10*3/uL (ref 3.4–10.8)

## 2021-11-11 NOTE — Progress Notes (Signed)
Hello [name],  Your lab result is normal and/or stable.Some minor variations that are not significant are commonly marked abnormal, but do not represent any medical problem for you.  Best regards, Zae Kirtz, M.D.

## 2021-11-26 DIAGNOSIS — E782 Mixed hyperlipidemia: Secondary | ICD-10-CM

## 2021-11-26 DIAGNOSIS — N400 Enlarged prostate without lower urinary tract symptoms: Secondary | ICD-10-CM

## 2021-11-26 DIAGNOSIS — E119 Type 2 diabetes mellitus without complications: Secondary | ICD-10-CM

## 2021-11-26 DIAGNOSIS — E11319 Type 2 diabetes mellitus with unspecified diabetic retinopathy without macular edema: Secondary | ICD-10-CM

## 2021-12-05 ENCOUNTER — Other Ambulatory Visit: Payer: Self-pay | Admitting: Family Medicine

## 2021-12-10 ENCOUNTER — Encounter: Payer: Self-pay | Admitting: Family Medicine

## 2021-12-10 ENCOUNTER — Ambulatory Visit (INDEPENDENT_AMBULATORY_CARE_PROVIDER_SITE_OTHER): Payer: PPO | Admitting: Family Medicine

## 2021-12-10 DIAGNOSIS — R6889 Other general symptoms and signs: Secondary | ICD-10-CM | POA: Diagnosis not present

## 2021-12-10 DIAGNOSIS — Z20828 Contact with and (suspected) exposure to other viral communicable diseases: Secondary | ICD-10-CM

## 2021-12-10 MED ORDER — OSELTAMIVIR PHOSPHATE 75 MG PO CAPS: 75.0000 mg | ORAL_CAPSULE | Freq: Two times a day (BID) | ORAL | 0 refills | Status: AC

## 2021-12-10 MED ORDER — BENZONATATE 100 MG PO CAPS: 100.0000 mg | ORAL_CAPSULE | Freq: Three times a day (TID) | ORAL | 0 refills | Status: DC | PRN

## 2021-12-10 NOTE — Progress Notes (Signed)
Telephone visit  Subjective: CC:flu like symptoms PCP: [name], MD [id number] R Tijerina is a 70 y.o. male calls for telephone consult today. Patient provides verbal consent for consult held via phone.  Due to COVID-19 pandemic this visit was conducted virtually. This visit type was conducted due to national recommendations for restrictions regarding the COVID-19 Pandemic (e.g. social distancing, sheltering in place) in an effort to limit this patient's exposure and mitigate transmission in our community. All issues noted in this document were discussed and addressed.  A physical exam was not performed with this format.   Location of patient: home Location of provider: [facility] Others present for call: none  1. Flu like symptoms Patient reports onset of sore throat on "Sunday.  He has since developed headache, sinus pressure, drainage.  Sore throat is getting somewhat better.  He reports cough. COVID testing at home negative.  Reports myalgia and low grade fever.  Using mucinex and alternating advil and tylenol.  Had flu shot this year.  He was exposed to flu last Friday by his grandson.  Tolerating p.o. intake without difficulty   ROS: Per HPI  Allergies  Allergen Reactions   Penicillins Rash    Did it involve swelling of the face/tongue/throat, SOB, or low BP? No Did it involve sudden or severe rash/hives, skin peeling, or any reaction on the inside of your mouth or nose? no Did you need to seek medical attention at a hospital or doctor's office? No When did it last happen?     50"  years ago  If all above answers are NO, may proceed with cephalosporin use.    Past Medical History:  Diagnosis Date   Chest discomfort 1989   Diverticulosis    DM type 2 (diabetes mellitus, type 2) (HCC)    Heart murmur    History of kidney stones    Hyperlipidemia     Current Outpatient Medications:    aspirin 81 MG EC tablet, Take 81 mg by mouth daily., Disp: , Rfl:    atorvastatin (LIPITOR)  40 MG tablet, Take 1 tablet (40 mg total) by mouth daily. TAKE 1/2 TABLET BY MOUTH EVERY DAY AT BEDTIME, Disp: 90 tablet, Rfl: 3   Blood Glucose Monitoring Suppl (ONETOUCH VERIO) w/Device KIT, 1 Stick by Does not apply route 2 (two) times daily., Disp: 1 kit, Rfl: 0   Cholecalciferol (VITAMIN D3) 5000 UNITS CAPS, Take 5,000 Units by mouth daily., Disp: , Rfl:    fluticasone (FLONASE) 50 MCG/ACT nasal spray, Place 2 sprays into both nostrils daily., Disp: 16 g, Rfl: 6   glipiZIDE (GLUCOTROL) 5 MG tablet, TAKE 1 TABLET BY MOUTH DAILY BEFORE BREAKFAST., Disp: 30 tablet, Rfl: 2   Glycerin-Hypromellose-PEG 400 (VISINE DRY EYE) 0.2-0.2-1 % SOLN, Place 1-2 drops into both eyes 4 (four) times daily as needed., Disp: , Rfl:    Krill Oil 1000 MG CAPS, Take 1,000 mg by mouth daily., Disp: , Rfl:    lisinopril (ZESTRIL) 5 MG tablet, Take 1 tablet (5 mg total) by mouth daily., Disp: 90 tablet, Rfl: 3   metFORMIN (GLUCOPHAGE) 500 MG tablet, Take 2 tablets (1,000 mg total) by mouth 2 (two) times daily with a meal., Disp: 360 tablet, Rfl: 3   Multiple Vitamin (MULTIVITAMIN WITH MINERALS) TABS tablet, Take 1 tablet by mouth daily., Disp: , Rfl:    sildenafil (REVATIO) 20 MG tablet, TAKE 2-5 TABLETS AS NEEDED PRIOR TO SEXUAL ACTIVITY, Disp: 50 tablet, Rfl: 3   sitaGLIPtin (JANUVIA) 100 MG tablet, Take  1 tablet (100 mg total) by mouth daily., Disp: 90 tablet, Rfl: 1  Assessment/ Plan: 70 y.o. male   Flu-like symptoms - Plan: oseltamivir (TAMIFLU) 75 MG capsule, benzonatate (TESSALON PERLES) 100 MG capsule  Exposure to the flu - Plan: oseltamivir (TAMIFLU) 75 MG capsule, benzonatate (TESSALON PERLES) 100 MG capsule  Very suspicious for influenza given direct exposure 2 days prior to onset of symptoms.  I offered testing but he prefers to empirically treat.  Tamiflu sent.  Tessalon Perles also sent for cough.  Discussed red flag signs and symptoms warranting further evaluation.  Push oral fluids.  Follow-up as  needed  Start time: 1:25pm End time: 1:30pm  Total time spent on patient care (including telephone call/ virtual visit): 5 minutes  [place], [place] [phone]

## 2021-12-26 ENCOUNTER — Telehealth: Payer: PPO

## 2022-01-04 IMAGING — MR MR MRA HEAD W/O CM
1 series · 16 of 48 positions shown · non-contrast
Comparison: None.

CLINICAL DATA: Focal neuro deficit. Rule out stroke. Right eye
drooping. Blurred vision.

EXAM:
MRI HEAD WITHOUT CONTRAST
MRA HEAD WITHOUT CONTRAST
TECHNIQUE: Multiplanar, multiecho pulse sequences of the brain and surrounding
structures were obtained without intravenous contrast. Angiographic
images of the head were obtained using MRA technique without
contrast.

[Series 2: TOF fat-sat · axial · 0.8mm · 0.38mm/px · z∈[-23,+89]mm · 16 of 150 slices shown]
[im 1/150]
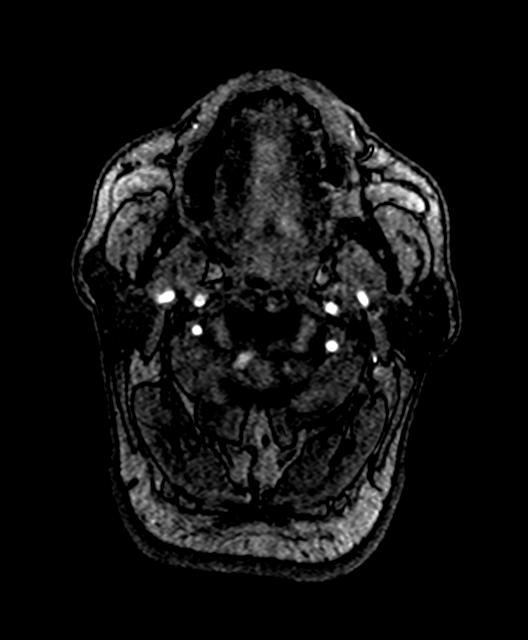
[im 4/150]
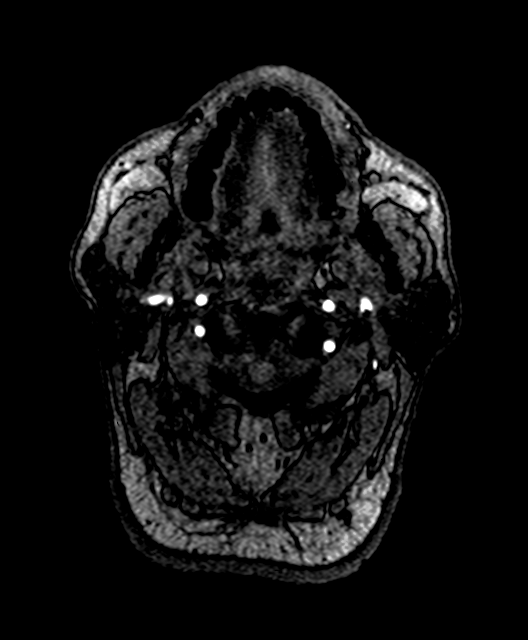
[im 7/150]
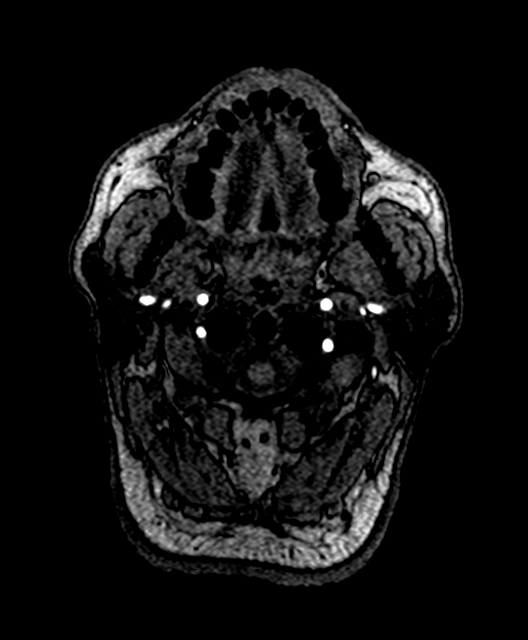
[im 10/150]
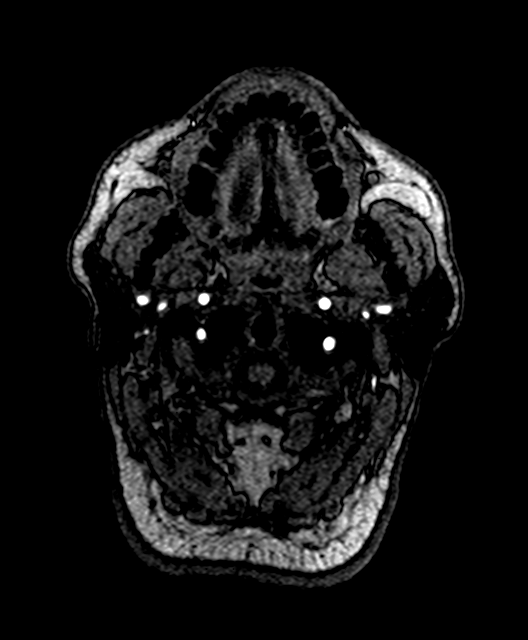
[im 13/150]
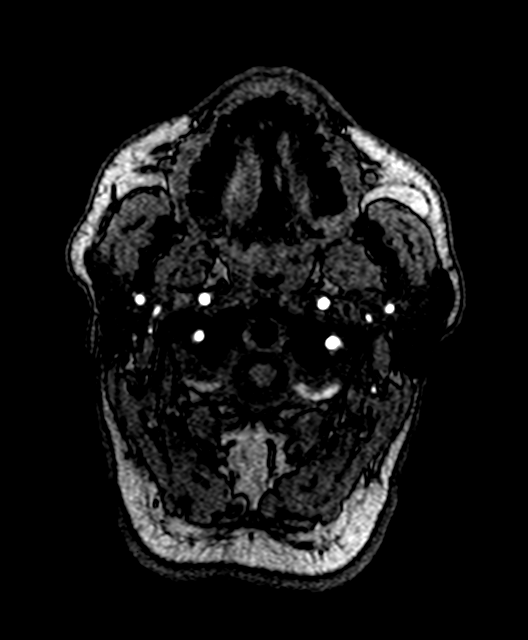
[im 16/150]
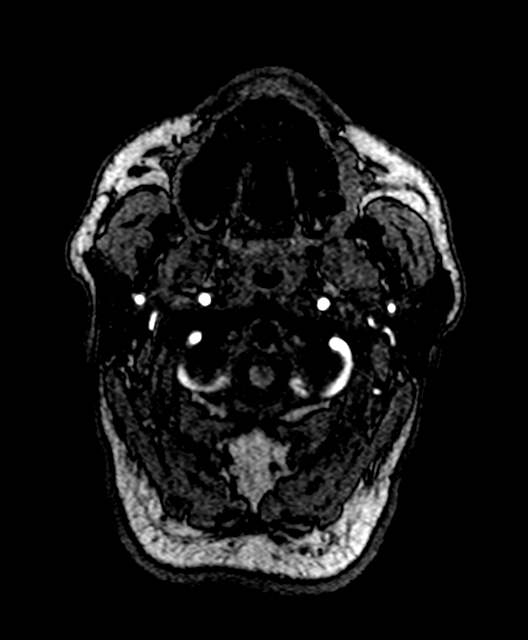
[im 26/150]
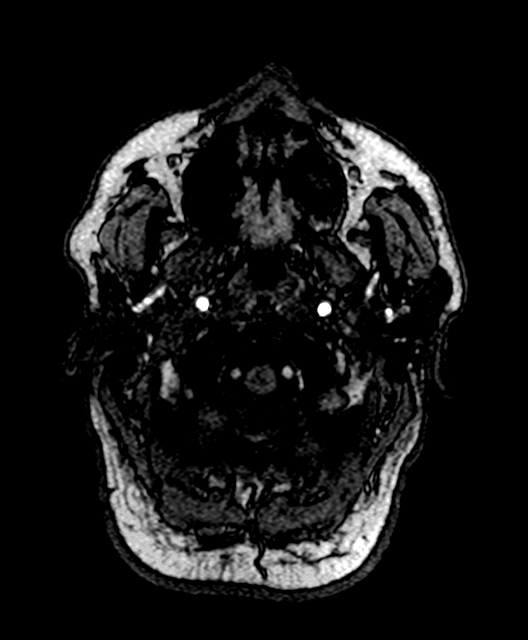
[im 29/150]
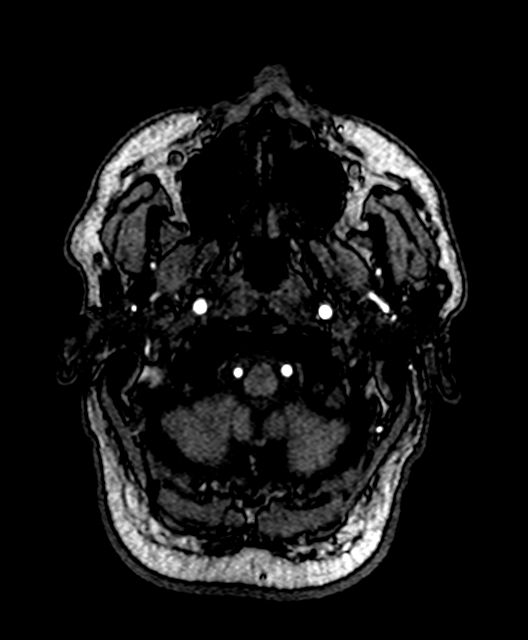
[im 48/150]
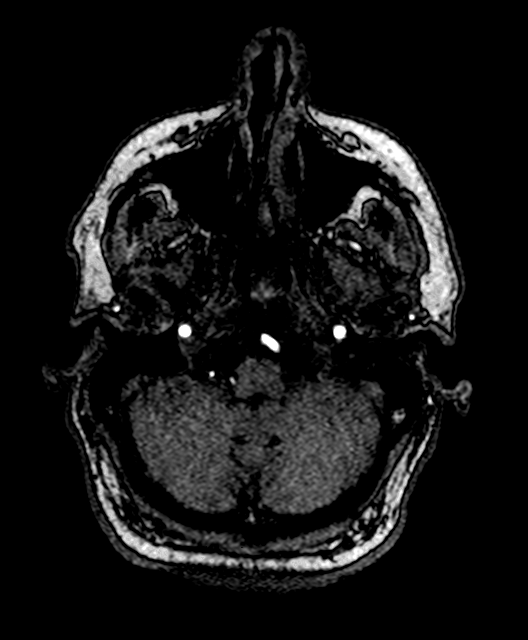
[im 67/150]
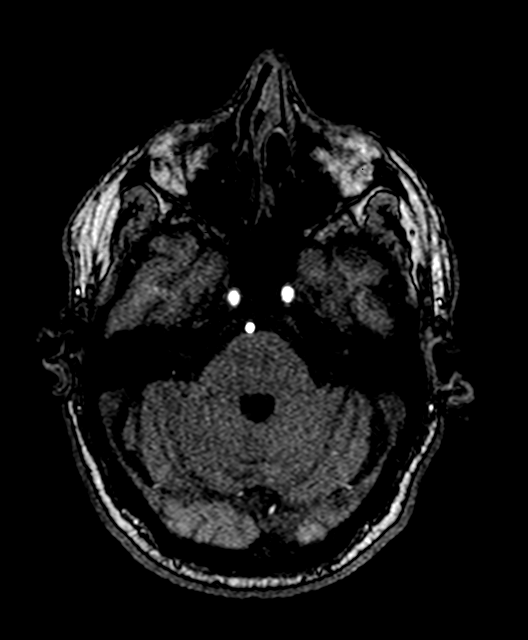
[im 77/150]
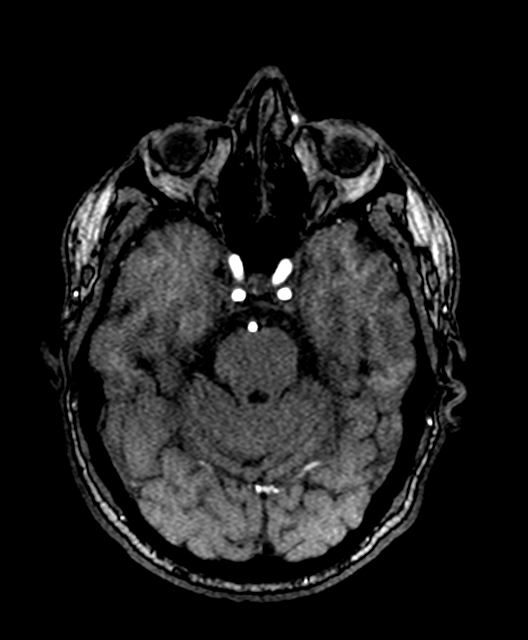
[im 86/150]
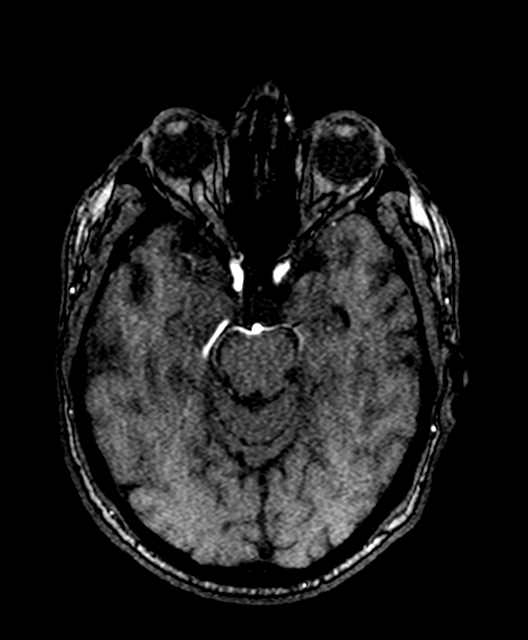
[im 105/150]
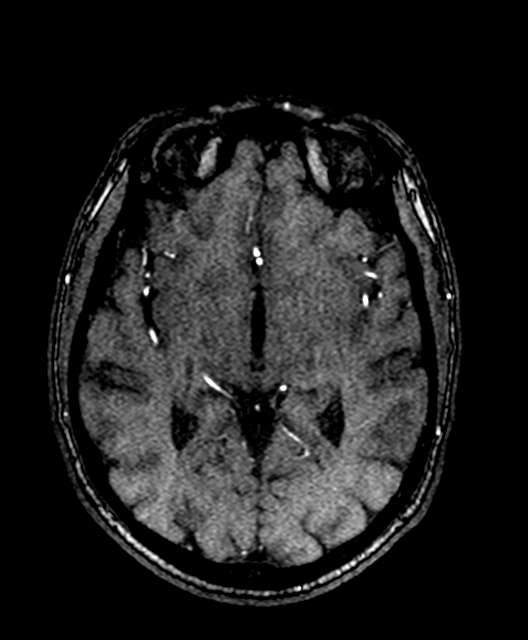
[im 124/150]
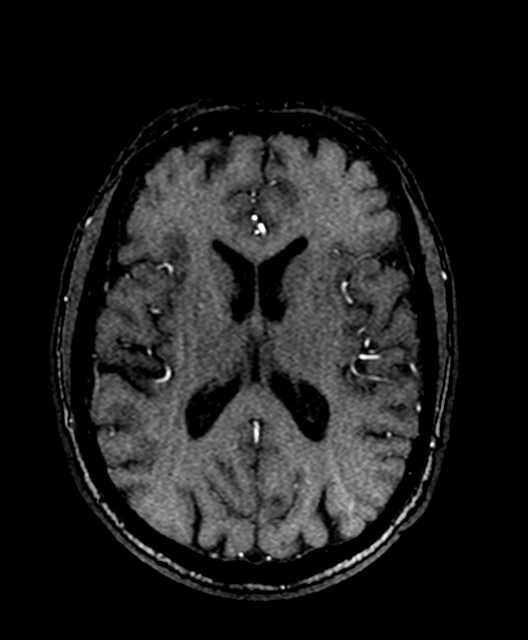
[im 127/150]
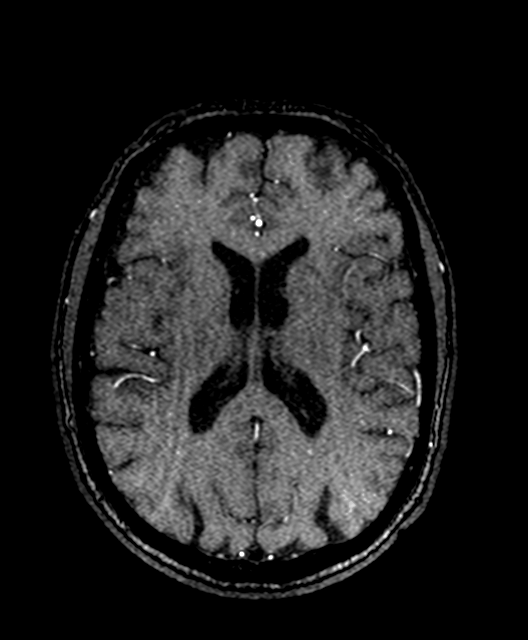
[im 143/150]
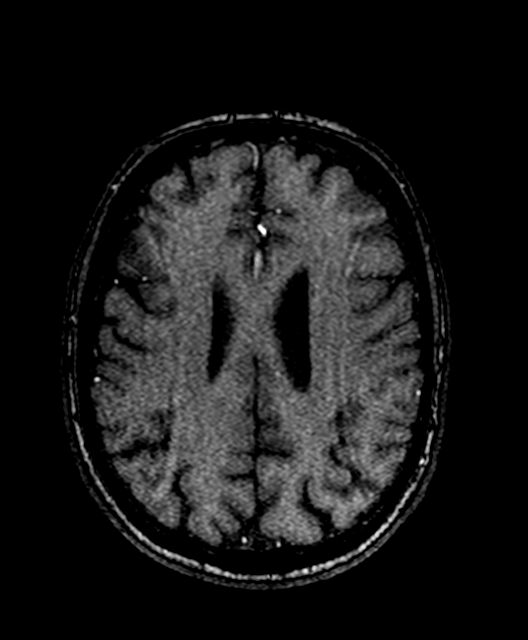

[16 of 48 positions shown; findings below may reference images not displayed]

FINDINGS: MRI HEAD FINDINGS

Brain: No acute infarction, hemorrhage, hydrocephalus, extra-axial
collection or mass lesion. Normal white matter and brainstem.

Vascular: Normal arterial flow voids

Skull and upper cervical spine: Negative

Sinuses/Orbits: Mild mucosal edema paranasal sinuses. Negative orbit

Other: None

MRA HEAD FINDINGS

Both vertebral arteries patent to the basilar. Right PICA patent.
Left PICA not visualized. Left AICA patent. Basilar widely patent.
Superior cerebellar and posterior cerebral arteries patent
bilaterally without stenosis.

Internal carotid artery normal bilaterally. Anterior and middle
cerebral arteries normal bilaterally.
IMPRESSION: Negative MRI head

Negative MRA head

## 2022-01-15 ENCOUNTER — Telehealth: Payer: PPO

## 2022-02-09 ENCOUNTER — Ambulatory Visit: Payer: PPO | Admitting: Family Medicine

## 2022-02-17 ENCOUNTER — Telehealth: Payer: Self-pay | Admitting: Licensed Clinical Social Worker

## 2022-02-17 ENCOUNTER — Telehealth: Payer: PPO

## 2022-02-17 NOTE — Telephone Encounter (Signed)
°  Chronic Care Management     Clinical Social Work Note   02/17/22 Name: Peter Mathis          MRN: [id number]       DOB: 07-Apr-1951   Peter Mathis is a 71 y.o. year old male who is a primary care patient of Stacks, Cletus Gash, MD. The CCM team was consulted to assist the patient with chronic disease management and/or care coordination needs related to: [name] .    Unsuccessful telephone call to client today. LCSW left phone message for client asking him to please call LCSW at [phone].  Follow Up Date:  04/14/22 at 3:00 PM  [name].Romel Dumond MSW, [place] representative [facility] Care Management [phone]

## 2022-03-06 ENCOUNTER — Other Ambulatory Visit: Payer: Self-pay | Admitting: Family Medicine

## 2022-03-12 ENCOUNTER — Ambulatory Visit (INDEPENDENT_AMBULATORY_CARE_PROVIDER_SITE_OTHER): Payer: PPO | Admitting: Family Medicine

## 2022-03-12 ENCOUNTER — Encounter: Payer: Self-pay | Admitting: Family Medicine

## 2022-03-12 VITALS — BP 128/70 | HR 69 | Temp 97.9°F | Ht 71.0 in | Wt 174.8 lb

## 2022-03-12 DIAGNOSIS — E1169 Type 2 diabetes mellitus with other specified complication: Secondary | ICD-10-CM | POA: Diagnosis not present

## 2022-03-12 DIAGNOSIS — E785 Hyperlipidemia, unspecified: Secondary | ICD-10-CM | POA: Diagnosis not present

## 2022-03-12 DIAGNOSIS — E119 Type 2 diabetes mellitus without complications: Secondary | ICD-10-CM | POA: Diagnosis not present

## 2022-03-12 DIAGNOSIS — E782 Mixed hyperlipidemia: Secondary | ICD-10-CM | POA: Diagnosis not present

## 2022-03-12 DIAGNOSIS — E11319 Type 2 diabetes mellitus with unspecified diabetic retinopathy without macular edema: Secondary | ICD-10-CM | POA: Diagnosis not present

## 2022-03-12 LAB — BAYER DCA HB A1C WAIVED: HB A1C (BAYER DCA - WAIVED): 7.5 % — ABNORMAL HIGH (ref 4.8–5.6)

## 2022-03-12 NOTE — Progress Notes (Signed)
? ?Subjective:  ?Patient ID: Peter Mathis,  ?male    DOB: Sep 29, 1951  Age: 71 y.o.  ? ? ?CC: Medical Management of Chronic Issues ? ? ?HPI ?Peter Mathis presents for  follow-up of elevated cholesterol. Doing well without complaints on current medication. Denies side effects  including myalgia and arthralgia and nausea. Also in today for liver function testing. Currently no chest pain, shortness of breath or other cardiovascular related symptoms noted. ? ?Follow-up of diabetes. Patient does check blood sugar at home. Readings run between 120 and 140 ?Patient denies symptoms such as excessive hunger or urinary frequency, excessive hunger, nausea ?No significant hypoglycemic spells noted. ?Medications reviewed. Pt reports taking them regularly. Pt. denies complication/adverse reaction today.  ? ? ?History ?Peter Mathis has a past medical history of Chest discomfort (1989), Diverticulosis, DM type 2 (diabetes mellitus, type 2) ([place]), Heart murmur, History of kidney stones, and Hyperlipidemia.  ? ?Peter Mathis has a past surgical history that includes Tonsillectomy and adenoidectomy (01/1955); Ganglion cyst excision (Right); and Inguinal hernia repair (Left, 04/03/2020).  ? ?His family history includes Diabetes in his mother; Heart attack in his father.Peter Mathis reports that Peter Mathis has never smoked. Peter Mathis has never used smokeless tobacco. Peter Mathis reports that Peter Mathis does not drink alcohol and does not use drugs. ? ?Current Outpatient Medications on File Prior to Visit  ?Medication Sig Dispense Refill  ? aspirin 81 MG EC tablet Take 81 mg by mouth daily.    ? atorvastatin (LIPITOR) 40 MG tablet Take 1 tablet (40 mg total) by mouth daily. TAKE 1/2 TABLET BY MOUTH EVERY DAY AT BEDTIME 90 tablet 3  ? benzonatate (TESSALON PERLES) 100 MG capsule Take 1 capsule (100 mg total) by mouth 3 (three) times daily as needed. 20 capsule 0  ? Blood Glucose Monitoring Suppl (ONETOUCH VERIO) w/Device KIT 1 Stick by Does not apply route 2 (two) times daily. 1 kit 0  ?  Cholecalciferol (VITAMIN D3) 5000 UNITS CAPS Take 5,000 Units by mouth daily.    ? fluticasone (FLONASE) 50 MCG/ACT nasal spray Place 2 sprays into both nostrils daily. 16 g 6  ? glipiZIDE (GLUCOTROL) 5 MG tablet TAKE 1 TABLET BY MOUTH EVERY DAY BEFORE BREAKFAST 90 tablet 0  ? Glycerin-Hypromellose-PEG 400 (VISINE DRY EYE) 0.2-0.2-1 % SOLN Place 1-2 drops into both eyes 4 (four) times daily as needed.    ? Krill Oil 1000 MG CAPS Take 1,000 mg by mouth daily.    ? lisinopril (ZESTRIL) 5 MG tablet Take 1 tablet (5 mg total) by mouth daily. 90 tablet 3  ? metFORMIN (GLUCOPHAGE) 500 MG tablet Take 2 tablets (1,000 mg total) by mouth 2 (two) times daily with a meal. 360 tablet 3  ? Multiple Vitamin (MULTIVITAMIN WITH MINERALS) TABS tablet Take 1 tablet by mouth daily.    ? sildenafil (REVATIO) 20 MG tablet TAKE 2-5 TABLETS AS NEEDED PRIOR TO SEXUAL ACTIVITY 50 tablet 3  ? sitaGLIPtin (JANUVIA) 100 MG tablet Take 1 tablet (100 mg total) by mouth daily. 90 tablet 1  ? ?No current facility-administered medications on file prior to visit.  ? ? ?ROS ?Review of Systems  ?Constitutional:  Negative for fever.  ?Respiratory:  Negative for shortness of breath.   ?Cardiovascular:  Negative for chest pain.  ?Musculoskeletal:  Negative for arthralgias.  ?Skin:  Negative for rash.  ? ?Objective:  ?BP 128/70   Pulse 69   Temp 97.9 ?F (36.6 ?C)   Ht $R'5\' 11"'HF$  (1.803 m)   Wt 174 lb 12.8  oz (79.3 kg)   SpO2 99%   BMI 24.38 kg/m?  ? ?BP Readings from Last 3 Encounters:  ?03/12/22 128/70  ?11/10/21 (!) 152/65  ?08/07/21 124/68  ? ? ?Wt Readings from Last 3 Encounters:  ?03/12/22 174 lb 12.8 oz (79.3 kg)  ?11/10/21 173 lb 6.4 oz (78.7 kg)  ?10/31/21 170 lb (77.1 kg)  ? ? ? ?Physical Exam ?Vitals reviewed.  ?Constitutional:   ?   Appearance: Peter Mathis is well-developed.  ?HENT:  ?   Head: Normocephalic and atraumatic.  ?   Right Ear: External ear normal.  ?   Left Ear: External ear normal.  ?   Mouth/Throat:  ?   Pharynx: No oropharyngeal exudate  or posterior oropharyngeal erythema.  ?Eyes:  ?   Pupils: Pupils are equal, round, and reactive to light.  ?Cardiovascular:  ?   Rate and Rhythm: Normal rate and regular rhythm.  ?   Heart sounds: No murmur heard. ?Pulmonary:  ?   Effort: No respiratory distress.  ?   Breath sounds: Normal breath sounds.  ?Musculoskeletal:  ?   Cervical back: Normal range of motion and neck supple.  ?Neurological:  ?   Mental Status: Peter Mathis is alert and oriented to person, place, and time.  ? ? ?Diabetic Foot Exam - Simple   ?No data filed ?  ? ? ? ? ?Assessment & Plan:  ? ?Peter Mathis was seen today for medical management of chronic issues. ? ?Diagnoses and all orders for this visit: ? ?Mixed hyperlipidemia ?-     Lipid panel ? ?Type 2 diabetes mellitus without complication, without long-term current use of insulin ([place]) ?-     Bayer DCA Hb A1c Waived ?-     CBC with Differential/Platelet ?-     CMP14+EGFR ? ? ?I am having Peter Mathis maintain his aspirin, Vitamin D3, OneTouch Verio, Krill Oil, multivitamin with minerals, Visine Dry Eye, fluticasone, lisinopril, metFORMIN, sitaGLIPtin, atorvastatin, sildenafil, benzonatate, and glipiZIDE. ? ?No orders of the defined types were placed in this encounter. ? ? ? ?Follow-up: Return in about 3 months (around 06/12/2022). ? ?[name], M.D. ?

## 2022-03-13 LAB — CMP14+EGFR
ALT: 22 IU/L (ref 0–44)
AST: 20 IU/L (ref 0–40)
Albumin/Globulin Ratio: 1.7 (ref 1.2–2.2)
Albumin: 4.3 g/dL (ref 3.7–4.7)
Alkaline Phosphatase: 46 IU/L (ref 44–121)
BUN/Creatinine Ratio: 14 (ref 10–24)
BUN: 13 mg/dL (ref 8–27)
Bilirubin Total: 0.5 mg/dL (ref 0.0–1.2)
CO2: 28 mmol/L (ref 20–29)
Calcium: 9.8 mg/dL (ref 8.6–10.2)
Chloride: 101 mmol/L (ref 96–106)
Creatinine, Ser: 0.91 mg/dL (ref 0.76–1.27)
Globulin, Total: 2.5 g/dL (ref 1.5–4.5)
Glucose: 139 mg/dL — ABNORMAL HIGH (ref 70–99)
Potassium: 5.1 mmol/L (ref 3.5–5.2)
Sodium: 138 mmol/L (ref 134–144)
Total Protein: 6.8 g/dL (ref 6.0–8.5)
eGFR: 90 mL/min/{1.73_m2} (ref >59)

## 2022-03-13 LAB — CBC WITH DIFFERENTIAL/PLATELET
Basophils Absolute: 0 10*3/uL (ref 0.0–0.2)
Basos: 0 %
EOS (ABSOLUTE): 0.1 10*3/uL (ref 0.0–0.4)
Eos: 1 %
Hematocrit: 42.9 % (ref 37.5–51.0)
Hemoglobin: 14.7 g/dL (ref 13.0–17.7)
Immature Grans (Abs): 0 10*3/uL (ref 0.0–0.1)
Immature Granulocytes: 0 %
Lymphocytes Absolute: 2.2 10*3/uL (ref 0.7–3.1)
Lymphs: 27 %
MCH: 29.6 pg (ref 26.6–33.0)
MCHC: 34.3 g/dL (ref 31.5–35.7)
MCV: 87 fL (ref 79–97)
Monocytes Absolute: 0.6 10*3/uL (ref 0.1–0.9)
Monocytes: 7 %
Neutrophils Absolute: 5.1 10*3/uL (ref 1.4–7.0)
Neutrophils: 65 %
Platelets: 272 10*3/uL (ref 150–450)
RBC: 4.96 x10E6/uL (ref 4.14–5.80)
RDW: 12.6 % (ref 11.6–15.4)
WBC: 8 10*3/uL (ref 3.4–10.8)

## 2022-03-13 LAB — LIPID PANEL
Chol/HDL Ratio: 2.5 ratio (ref 0.0–5.0)
Cholesterol, Total: 99 mg/dL — ABNORMAL LOW (ref 100–199)
HDL: 40 mg/dL (ref >39)
LDL Chol Calc (NIH): 42 mg/dL (ref 0–99)
Triglycerides: 84 mg/dL (ref 0–149)
VLDL Cholesterol Cal: 17 mg/dL (ref 5–40)

## 2022-03-14 NOTE — Progress Notes (Signed)
Hello Peter Mathis,  Your lab result is normal and/or stable.Some minor variations that are not significant are commonly marked abnormal, but do not represent any medical problem for you.  Best regards, Ziya Coonrod, M.D.

## 2022-04-14 ENCOUNTER — Ambulatory Visit (INDEPENDENT_AMBULATORY_CARE_PROVIDER_SITE_OTHER): Payer: PPO | Admitting: Licensed Clinical Social Worker

## 2022-04-14 DIAGNOSIS — E782 Mixed hyperlipidemia: Secondary | ICD-10-CM

## 2022-04-14 DIAGNOSIS — E11319 Type 2 diabetes mellitus with unspecified diabetic retinopathy without macular edema: Secondary | ICD-10-CM

## 2022-04-14 DIAGNOSIS — E559 Vitamin D deficiency, unspecified: Secondary | ICD-10-CM

## 2022-04-14 DIAGNOSIS — E119 Type 2 diabetes mellitus without complications: Secondary | ICD-10-CM

## 2022-04-14 DIAGNOSIS — N400 Enlarged prostate without lower urinary tract symptoms: Secondary | ICD-10-CM

## 2022-04-14 NOTE — Chronic Care Management (AMB) (Signed)
?Chronic Care Management  ? ? Clinical Social Work Note ? ?04/14/2022 ?Name: Peter Mathis MRN: [id number] DOB: Mar 23, 1951 ? ?FIELDS OROS is a 71 y.o. year old male who is a primary care patient of Stacks, Cletus Gash, MD. The CCM team was consulted to assist the patient with chronic disease management and/or care coordination needs related to: [name] .  ? ?Engaged with patient by telephone for follow up visit in response to provider referral for social work chronic care management and care coordination services.  ? ?Consent to Services:  ?The patient was given information about Chronic Care Management services, agreed to services, and gave verbal consent prior to initiation of services.  Please see initial visit note for detailed documentation.  ? ?Patient agreed to services and consent obtained.  ? ?Assessment: Review of patient past medical history, allergies, medications, and health status, including review of relevant consultants reports was performed today as part of a comprehensive evaluation and provision of chronic care management and care coordination services.    ? ?SDOH (Social Determinants of Health) assessments and interventions performed:  ?SDOH Interventions   ? ?Flowsheet Row Most Recent Value  ?SDOH Interventions   ?Stress Interventions Other (Comment)  [client has some stress related to managing medical conditions]  ?Depression Interventions/Treatment  Counseling  ? ?  ?  ? ?Advanced Directives Status: See Vynca application for related entries. ? ?CCM Care Plan ? ?Allergies  ?Allergen Reactions  ? Penicillins Rash  ?  Did it involve swelling of the face/tongue/throat, SOB, or low BP? No ?Did it involve sudden or severe rash/hives, skin peeling, or any reaction on the inside of your mouth or nose? no ?Did you need to seek medical attention at a hospital or doctor's office? No ?When did it last happen?     50 years ago  ?If all above answers are ?NO?, may proceed with cephalosporin  use. ?  ? ? ?Outpatient Encounter Medications as of 04/14/2022  ?Medication Sig  ? aspirin 81 MG EC tablet Take 81 mg by mouth daily.  ? atorvastatin (LIPITOR) 40 MG tablet Take 1 tablet (40 mg total) by mouth daily. TAKE 1/2 TABLET BY MOUTH EVERY DAY AT BEDTIME  ? benzonatate (TESSALON PERLES) 100 MG capsule Take 1 capsule (100 mg total) by mouth 3 (three) times daily as needed.  ? Blood Glucose Monitoring Suppl (ONETOUCH VERIO) w/Device KIT 1 Stick by Does not apply route 2 (two) times daily.  ? Cholecalciferol (VITAMIN D3) 5000 UNITS CAPS Take 5,000 Units by mouth daily.  ? fluticasone (FLONASE) 50 MCG/ACT nasal spray Place 2 sprays into both nostrils daily.  ? Glycerin-Hypromellose-PEG 400 (VISINE DRY EYE) 0.2-0.2-1 % SOLN Place 1-2 drops into both eyes 4 (four) times daily as needed.  ? Krill Oil 1000 MG CAPS Take 1,000 mg by mouth daily.  ? lisinopril (ZESTRIL) 5 MG tablet Take 1 tablet (5 mg total) by mouth daily.  ? metFORMIN (GLUCOPHAGE) 500 MG tablet Take 2 tablets (1,000 mg total) by mouth 2 (two) times daily with a meal.  ? Multiple Vitamin (MULTIVITAMIN WITH MINERALS) TABS tablet Take 1 tablet by mouth daily.  ? sildenafil (REVATIO) 20 MG tablet TAKE 2-5 TABLETS AS NEEDED PRIOR TO SEXUAL ACTIVITY  ? sitaGLIPtin (JANUVIA) 100 MG tablet Take 1 tablet (100 mg total) by mouth daily.  ? ?No facility-administered encounter medications on file as of 04/14/2022.  ? ? ?Patient Active Problem List  ? Diagnosis Date Noted  ? Pharyngitis 04/28/2021  ? Lab test  positive for detection of COVID-19 virus 04/28/2021  ? Erectile dysfunction 05/07/2020  ? Left inguinal hernia 03/14/2020  ? Diabetic retinopathy ([place]) 01/19/2020  ? Vitamin D deficiency 10/04/2019  ? Erectile dysfunction due to type 2 diabetes mellitus ([place]) 10/04/2019  ? 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 Seborrheic keratosis 01/12/2017  ? Erectile dysfunction due to arterial insufficiency 01/23/2016  ? Right bundle branch block, history of 01/17/2014   ? Hyperlipidemia associated with type 2 diabetes mellitus ([place]) 07/12/2013  ? Type II diabetes mellitus ([place]) 07/12/2013  ? Benign prostatic hyperplasia without lower urinary tract symptoms 07/12/2013  ? ? ?Conditions to be addressed/monitored: monitor client completion of ADLs ? ?Care Plan : LCSW Care Plan  ?Updates made by Katha Cabal, LCSW since 04/14/2022 12:00 AM  ?  ? ?Problem: Coping Skills (General Plan of Care)   ?  ? ?Goal: Coping Skills Enhanced: Manage ADL completion and manage vision issues   ?Start Date: 04/14/2022  ?Expected End Date: 07/13/2022  ?This Visit's Progress: On track  ?Recent Progress: On track  ?Priority: Medium  ?Note:   ?Current barriers:   ?Patient in need of assistance with connecting to community resources for possible help with ADL completion and with vision issues of client ?Challenges in managing medical needs ?Some financial challenges ? ?Clinical Goals:  ?patient will work with SW monthly to address concerns related to ADLs completion of client and vision challenges of client ?Client will attend all scheduled client medical appointments in next 30 days  ?Patient will call RNCM as needed in next 30 days to discuss nursing needs of client ? ?Clinical Interventions:  ?Collaboration with [name], MD regarding development and update of comprehensive plan of care as evidenced by provider attestation and co-signature ?Assessment of needs, barriers  of client ?Discussed vision needs of client with [place]. He said he goes one time each year for eye exam.   ?Reviewed sleeping issues of client and reviewed pain issues of client.  ?Reviewed medication procurement with [place].  ?Discussed ADLs completion with client (he said he is having no problem completing ADLs) ?Reviewed CCM program support with client. ?Discussed ambulation of client. He said he is walking well.  He enjoys outdoor activities such as caring for the yard. 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 ?Encouraged client to call RNCM as needed for CCM nursing support ?Reviewed appetite of client.  Client said he is eating well. ?Reviewed cardiology support. Client said he goes 1 time per year for appointment with cardiologist. ? ?Patient Coping Skills: ?Completes ADLs daily, as able ?Attends scheduled medical appointments ?Takes medications as prescribed ? ?Patient Deficits: ?Vision challenges ? ?Patient Goals:  ?In next 30 days,patient will: ?Call RNCM or LCSW as needed for CCM program support ?Attend scheduled medical appointments ?Talk with Leonette Nutting, regularly, about client needs ?-  ?Follow Up Plan: LCSW to call client on 06/03/22 at 2:00 PM   ?  ?  ?[name].Janari Yamada MSW, LCSW ?Licensed Clinical Social Worker ?[facility] ?[phone] ?

## 2022-04-14 NOTE — Patient Instructions (Addendum)
Visit Information ? ?Patient goals:  Manage emotions. Complete ADLs daily as able. Manage vision issues ? ?Timeframe:  Short-Term Goal ?Priority:  Medium ?Progress: On Track ?Start Date:            04/14/22                ?Expected End Date:          07/13/22           ? ?Follow Up Date  06/03/22 at 2:00 PM ?  ?Manage emotions: Complete ADLs daily as able . Manage vision issues ?  ?Why is this important?   ?When you are stressed, down or upset, your body reacts too.  ?For example, your blood pressure may get higher; you may have a headache or stomachache.  ?When your emotions get the best of you, your body's ability to fight off cold and flu gets weak.  ?These steps will help you manage your emotions.   ?  ?Patient Coping Skills: ?Completes ADLs daily, as able ?Attends scheduled medical appointments ?Takes medications as prescribed ? ?Patient Deficits: ?Vision challenges ? ?Patient Goals:  ?In next 30 days,patient will: ?Call RNCM or LCSW as needed for CCM program support ?Attend scheduled medical appointments ?Talk with Leonette Nutting, regularly, about client needs ?-  ?Follow Up Plan: LCSW to call client on 06/03/22 at 2:00 PM    ? ?[name].Aaliyha Mumford MSW, LCSW ?Licensed Clinical Social Worker ?[facility] ?[phone] ?

## 2022-04-26 DIAGNOSIS — E782 Mixed hyperlipidemia: Secondary | ICD-10-CM

## 2022-04-26 DIAGNOSIS — E11319 Type 2 diabetes mellitus with unspecified diabetic retinopathy without macular edema: Secondary | ICD-10-CM

## 2022-04-26 DIAGNOSIS — N400 Enlarged prostate without lower urinary tract symptoms: Secondary | ICD-10-CM

## 2022-04-26 DIAGNOSIS — E119 Type 2 diabetes mellitus without complications: Secondary | ICD-10-CM

## 2022-06-03 ENCOUNTER — Ambulatory Visit (INDEPENDENT_AMBULATORY_CARE_PROVIDER_SITE_OTHER): Payer: PPO | Admitting: Licensed Clinical Social Worker

## 2022-06-03 DIAGNOSIS — E119 Type 2 diabetes mellitus without complications: Secondary | ICD-10-CM

## 2022-06-03 DIAGNOSIS — E559 Vitamin D deficiency, unspecified: Secondary | ICD-10-CM

## 2022-06-03 DIAGNOSIS — E782 Mixed hyperlipidemia: Secondary | ICD-10-CM

## 2022-06-03 DIAGNOSIS — N400 Enlarged prostate without lower urinary tract symptoms: Secondary | ICD-10-CM

## 2022-06-03 DIAGNOSIS — E11319 Type 2 diabetes mellitus with unspecified diabetic retinopathy without macular edema: Secondary | ICD-10-CM

## 2022-06-03 NOTE — Patient Instructions (Addendum)
Visit Information  Patient goals:  Manage emotions. Complete ADLs daily as able. Manage vision issues  Timeframe:  Short-Term Goal Priority:  Medium Progress: On Track Start Date:            04/14/22                Expected End Date:          07/13/22            Follow Up Date  LCSW is discharging client today from [facility] emotions: Complete ADLs daily as able . Manage vision issues   Why is this important?   When you are stressed, down or upset, your body reacts too.  For example, your blood pressure may get higher; you may have a headache or stomachache.  When your emotions get the best of you, your body's ability to fight off cold and flu gets weak.  These steps will help you manage your emotions.     Patient Coping Skills: Completes ADLs daily, as able Attends scheduled medical appointments Takes medications as prescribed  Patient Deficits: Vision challenges  Patient Goals:  In next 30 days,patient will: Call RNCM or LCSW as needed for CCM program support Attend scheduled medical appointments Talk with Leonette Nutting, regularly, about client needs -  Follow Up Plan: LCSW  is discharging client today from [facility]. Client agrees with this plan  [name].Augusten Lipkin MSW, [place] [employer] representative [facility] Care Management [phone]

## 2022-06-03 NOTE — Chronic Care Management (AMB) (Signed)
Chronic Care Management    Clinical Social Work Note  06/03/2022 Name: Peter Mathis MRN: [id number] DOB: 05/13/1951  Peter Mathis is a 71 y.o. year old male who is a primary care patient of Stacks, Cletus Gash, MD. The CCM team was consulted to assist the patient with chronic disease management and/or care coordination needs related to: [name] .   Engaged with patient by telephone for follow up visit in response to provider referral for social work chronic care management and care coordination services.   Consent to Services:  The patient was given information about Chronic Care Management services, agreed to services, and gave verbal consent prior to initiation of services.  Please see initial visit note for detailed documentation.   Patient agreed to services and consent obtained.   Assessment: Review of patient past medical history, allergies, medications, and health status, including review of relevant consultants reports was performed today as part of a comprehensive evaluation and provision of chronic care management and care coordination services.     SDOH (Social Determinants of Health) assessments and interventions performed:  SDOH Interventions    Flowsheet Row Most Recent Value  SDOH Interventions   Stress Interventions Provide Counseling  [client has some stress related to managing medical needs]  Depression Interventions/Treatment  Counseling        Advanced Directives Status: See Vynca application for related entries.  CCM Care Plan  Allergies  Allergen Reactions   Penicillins Rash    Did it involve swelling of the face/tongue/throat, SOB, or low BP? No Did it involve sudden or severe rash/hives, skin peeling, or any reaction on the inside of your mouth or nose? no Did you need to seek medical attention at a hospital or doctor's office? No When did it last happen?     50 years ago  If all above answers are "NO", may proceed with cephalosporin use.      Outpatient Encounter Medications as of 06/03/2022  Medication Sig   aspirin 81 MG EC tablet Take 81 mg by mouth daily.   atorvastatin (LIPITOR) 40 MG tablet Take 1 tablet (40 mg total) by mouth daily. TAKE 1/2 TABLET BY MOUTH EVERY DAY AT BEDTIME   benzonatate (TESSALON PERLES) 100 MG capsule Take 1 capsule (100 mg total) by mouth 3 (three) times daily as needed.   Blood Glucose Monitoring Suppl (ONETOUCH VERIO) w/Device KIT 1 Stick by Does not apply route 2 (two) times daily.   Cholecalciferol (VITAMIN D3) 5000 UNITS CAPS Take 5,000 Units by mouth daily.   fluticasone (FLONASE) 50 MCG/ACT nasal spray Place 2 sprays into both nostrils daily.   Glycerin-Hypromellose-PEG 400 (VISINE DRY EYE) 0.2-0.2-1 % SOLN Place 1-2 drops into both eyes 4 (four) times daily as needed.   Krill Oil 1000 MG CAPS Take 1,000 mg by mouth daily.   lisinopril (ZESTRIL) 5 MG tablet Take 1 tablet (5 mg total) by mouth daily.   metFORMIN (GLUCOPHAGE) 500 MG tablet Take 2 tablets (1,000 mg total) by mouth 2 (two) times daily with a meal.   Multiple Vitamin (MULTIVITAMIN WITH MINERALS) TABS tablet Take 1 tablet by mouth daily.   sildenafil (REVATIO) 20 MG tablet TAKE 2-5 TABLETS AS NEEDED PRIOR TO SEXUAL ACTIVITY   sitaGLIPtin (JANUVIA) 100 MG tablet Take 1 tablet (100 mg total) by mouth daily.   No facility-administered encounter medications on file as of 06/03/2022.    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   Seborrheic keratosis 01/12/2017   Erectile dysfunction due to arterial insufficiency 01/23/2016   Right bundle branch block, history of 01/17/2014    Hyperlipidemia associated with type 2 diabetes mellitus ([place]) 07/12/2013   Type II diabetes mellitus ([place]) 07/12/2013   Benign prostatic hyperplasia without lower urinary tract symptoms 07/12/2013    Conditions to be addressed/monitored: monitor client completion of ADLs  Care Plan : LCSW Care Plan  Updates made by Peter Cabal, LCSW since 06/03/2022 12:00 AM     Problem: Coping Skills (General Plan of Care)      Goal: Coping Skills Enhanced: Manage ADL completion and manage vision issues   Start Date: 04/14/2022  Expected End Date: 07/13/2022  This Visit's Progress: On track  Recent Progress: On track  Priority: Medium  Note:   Current barriers:   Patient in need of assistance with connecting to community resources for possible help with ADL completion and with vision issues of client Challenges in managing medical needs Some financial challenges  Clinical Goals:  patient will work with SW monthly to address concerns related to ADLs completion of client and vision challenges of client Client will attend all scheduled client medical appointments in next 30 days  Patient will call RNCM as needed in next 30 days to discuss nursing needs of client  Clinical Interventions:  Collaboration with Peter Fraise, MD regarding development and update of comprehensive plan of care as evidenced by provider attestation and co-signature Assessment of needs, barriers  of client Discussed vision needs of client with Peter Mathis. He said he goes one time each year for eye exam.   Reviewed medication procurement with Peter Mathis.  Discussed ADLs completion with client (he said he is having no problem completing ADLs) Reviewed CCM program support with client. Discussed ambulation of client. He said he is walking well.  He enjoys outdoor activities such as caring for the yard. 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 Encouraged client to call RNCM as needed for CCM nursing  support Reviewed appetite of client.  Client said he is eating well. Reviewed cardiology support. Client said he goes 1 time per year for appointment with cardiologist. LCSW discussed with client that client appears stable related to SW needs.  Muhamad agreed that he was stable with SW needs. Thus, LCSW informed client today that LCSW would discharge client today from [facility] since client SW needs had been met. Client agreed to this plan LCSW thanked client for participation in CCM program support  Patient Coping Skills: Completes ADLs daily, as able Attends scheduled medical appointments Takes medications as prescribed  Patient Deficits: Vision challenges  Patient Goals:  In next 30 days,patient will: Call RNCM or LCSW as needed for CCM program support Attend scheduled medical appointments Talk with Peter Mathis, regularly, about client needs -  Follow Up Plan: LCSW is discharging client today from [facility]. Client agreed to this plan.      [name].Sherleen Pangborn MSW, [place] [employer] representative [facility] Care Management [phone]

## 2022-06-05 ENCOUNTER — Other Ambulatory Visit: Payer: Self-pay | Admitting: Family Medicine

## 2022-06-22 ENCOUNTER — Ambulatory Visit: Payer: PPO | Admitting: Family Medicine

## 2022-06-26 DIAGNOSIS — E782 Mixed hyperlipidemia: Secondary | ICD-10-CM

## 2022-06-26 DIAGNOSIS — E11319 Type 2 diabetes mellitus with unspecified diabetic retinopathy without macular edema: Secondary | ICD-10-CM

## 2022-06-26 DIAGNOSIS — N400 Enlarged prostate without lower urinary tract symptoms: Secondary | ICD-10-CM

## 2022-06-26 DIAGNOSIS — E119 Type 2 diabetes mellitus without complications: Secondary | ICD-10-CM

## 2022-07-09 DIAGNOSIS — H4901 Third [oculomotor] nerve palsy, right eye: Secondary | ICD-10-CM | POA: Diagnosis not present

## 2022-07-09 DIAGNOSIS — E119 Type 2 diabetes mellitus without complications: Secondary | ICD-10-CM | POA: Diagnosis not present

## 2022-07-09 DIAGNOSIS — H2513 Age-related nuclear cataract, bilateral: Secondary | ICD-10-CM | POA: Diagnosis not present

## 2022-07-09 DIAGNOSIS — H43811 Vitreous degeneration, right eye: Secondary | ICD-10-CM | POA: Diagnosis not present

## 2022-08-04 ENCOUNTER — Encounter: Payer: Self-pay | Admitting: Family Medicine

## 2022-08-04 ENCOUNTER — Ambulatory Visit (INDEPENDENT_AMBULATORY_CARE_PROVIDER_SITE_OTHER): Payer: PPO | Admitting: Family Medicine

## 2022-08-04 ENCOUNTER — Other Ambulatory Visit: Payer: Self-pay | Admitting: *Deleted

## 2022-08-04 ENCOUNTER — Other Ambulatory Visit: Payer: Self-pay | Admitting: Family Medicine

## 2022-08-04 VITALS — BP 141/70 | HR 67 | Temp 98.1°F | Ht 71.0 in | Wt 175.0 lb

## 2022-08-04 DIAGNOSIS — E119 Type 2 diabetes mellitus without complications: Secondary | ICD-10-CM | POA: Diagnosis not present

## 2022-08-04 DIAGNOSIS — E782 Mixed hyperlipidemia: Secondary | ICD-10-CM | POA: Diagnosis not present

## 2022-08-04 LAB — CMP14+EGFR
ALT: 16 IU/L (ref 0–44)
AST: 14 IU/L (ref 0–40)
Albumin/Globulin Ratio: 1.6 (ref 1.2–2.2)
Albumin: 4.2 g/dL (ref 3.8–4.8)
Alkaline Phosphatase: 50 IU/L (ref 44–121)
BUN/Creatinine Ratio: 11 (ref 10–24)
BUN: 10 mg/dL (ref 8–27)
Bilirubin Total: 0.4 mg/dL (ref 0.0–1.2)
CO2: 26 mmol/L (ref 20–29)
Calcium: 9.5 mg/dL (ref 8.6–10.2)
Chloride: 100 mmol/L (ref 96–106)
Creatinine, Ser: 0.93 mg/dL (ref 0.76–1.27)
Globulin, Total: 2.6 g/dL (ref 1.5–4.5)
Glucose: 116 mg/dL — ABNORMAL HIGH (ref 70–99)
Potassium: 5.3 mmol/L — ABNORMAL HIGH (ref 3.5–5.2)
Sodium: 138 mmol/L (ref 134–144)
Total Protein: 6.8 g/dL (ref 6.0–8.5)
eGFR: 87 mL/min/{1.73_m2} (ref >59)

## 2022-08-04 LAB — CBC WITH DIFFERENTIAL/PLATELET
Basophils Absolute: 0 10*3/uL (ref 0.0–0.2)
Basos: 0 %
EOS (ABSOLUTE): 0.1 10*3/uL (ref 0.0–0.4)
Eos: 1 %
Hematocrit: 41.7 % (ref 37.5–51.0)
Hemoglobin: 14.3 g/dL (ref 13.0–17.7)
Immature Grans (Abs): 0 10*3/uL (ref 0.0–0.1)
Immature Granulocytes: 0 %
Lymphocytes Absolute: 2 10*3/uL (ref 0.7–3.1)
Lymphs: 24 %
MCH: 29.9 pg (ref 26.6–33.0)
MCHC: 34.3 g/dL (ref 31.5–35.7)
MCV: 87 fL (ref 79–97)
Monocytes Absolute: 0.7 10*3/uL (ref 0.1–0.9)
Monocytes: 8 %
Neutrophils Absolute: 5.5 10*3/uL (ref 1.4–7.0)
Neutrophils: 67 %
Platelets: 269 10*3/uL (ref 150–450)
RBC: 4.79 x10E6/uL (ref 4.14–5.80)
RDW: 12.9 % (ref 11.6–15.4)
WBC: 8.4 10*3/uL (ref 3.4–10.8)

## 2022-08-04 LAB — BAYER DCA HB A1C WAIVED: HB A1C (BAYER DCA - WAIVED): 7.1 % — ABNORMAL HIGH (ref 4.8–5.6)

## 2022-08-04 LAB — LIPID PANEL
Chol/HDL Ratio: 2.5 ratio (ref 0.0–5.0)
Cholesterol, Total: 101 mg/dL (ref 100–199)
HDL: 40 mg/dL (ref >39)
LDL Chol Calc (NIH): 43 mg/dL (ref 0–99)
Triglycerides: 91 mg/dL (ref 0–149)
VLDL Cholesterol Cal: 18 mg/dL (ref 5–40)

## 2022-08-04 MED ORDER — ATORVASTATIN CALCIUM 40 MG PO TABS: 40.0000 mg | ORAL_TABLET | Freq: Every day | ORAL | 3 refills | Status: DC

## 2022-08-04 MED ORDER — SITAGLIPTIN PHOSPHATE 100 MG PO TABS: 100.0000 mg | ORAL_TABLET | Freq: Every day | ORAL | 1 refills | Status: DC

## 2022-08-04 MED ORDER — LISINOPRIL 5 MG PO TABS: 5.0000 mg | ORAL_TABLET | Freq: Every day | ORAL | 3 refills | Status: DC

## 2022-08-04 MED ORDER — METFORMIN HCL 500 MG PO TABS: 1000.0000 mg | ORAL_TABLET | Freq: Two times a day (BID) | ORAL | 3 refills | Status: DC

## 2022-08-04 NOTE — Progress Notes (Signed)
Subjective:  Patient ID: Peter Mathis,  male    DOB: July 21, 1951  Age: 71 y.o.    CC: Medical Management of Chronic Issues   HPI Peter Mathis presents for  follow-up of elevated cholesterol. Doing well without complaints on current medication. Denies side effects  including myalgia and arthralgia and nausea. Also in today for liver function testing. Currently no chest pain, shortness of breath or other cardiovascular related symptoms noted.  Follow-up of diabetes. Patient does check blood sugar at home. Readings run between 140 fasting  and 120 fasting.  Patient denies symptoms such as excessive hunger or urinary frequency, excessive hunger, nausea No significant hypoglycemic spells noted. Medications reviewed. Pt reports taking them regularly. Pt. denies complication/adverse reaction today.    History Peter Mathis has a past medical history of Chest discomfort (1989), Diverticulosis, DM type 2 (diabetes mellitus, type 2) ([place]), Heart murmur, History of kidney stones, and Hyperlipidemia.   He has a past surgical history that includes Tonsillectomy and adenoidectomy (01/1955); Ganglion cyst excision (Right); and Inguinal hernia repair (Left, 04/03/2020).   His family history includes Diabetes in his mother; Heart attack in his father.He reports that he has never smoked. He has never used smokeless tobacco. He reports that he does not drink alcohol and does not use drugs.  Current Outpatient Medications on File Prior to Visit  Medication Sig Dispense Refill   aspirin 81 MG EC tablet Take 81 mg by mouth daily.     Blood Glucose Monitoring Suppl (ONETOUCH VERIO) w/Device KIT 1 Stick by Does not apply route 2 (two) times daily. 1 kit 0   Cholecalciferol (VITAMIN D3) 5000 UNITS CAPS Take 5,000 Units by mouth daily.     fluticasone (FLONASE) 50 MCG/ACT nasal spray Place 2 sprays into both nostrils daily. 16 g 6   Glycerin-Hypromellose-PEG 400 (VISINE DRY EYE) 0.2-0.2-1 % SOLN Place 1-2 drops  into both eyes 4 (four) times daily as needed.     Krill Oil 1000 MG CAPS Take 1,000 mg by mouth daily.     Multiple Vitamin (MULTIVITAMIN WITH MINERALS) TABS tablet Take 1 tablet by mouth daily.     sildenafil (REVATIO) 20 MG tablet TAKE 2-5 TABLETS AS NEEDED PRIOR TO SEXUAL ACTIVITY 50 tablet 3   No current facility-administered medications on file prior to visit.    ROS Review of Systems  Constitutional:  Negative for fever.  Respiratory:  Negative for shortness of breath.   Cardiovascular:  Negative for chest pain.  Musculoskeletal:  Negative for arthralgias.  Skin:  Negative for rash.    Objective:  BP (!) 141/70   Pulse 67   Temp 98.1 F (36.7 C)   Ht _0  (1.803 m)   Wt 175 lb (79.4 kg)   SpO2 97%   BMI 24.41 kg/m   BP Readings from Last 3 Encounters:  08/04/22 (!) 141/70  03/12/22 128/70  11/10/21 (!) 152/65    Wt Readings from Last 3 Encounters:  08/04/22 175 lb (79.4 kg)  03/12/22 174 lb 12.8 oz (79.3 kg)  11/10/21 173 lb 6.4 oz (78.7 kg)     Physical Exam Vitals reviewed.  Constitutional:      Appearance: He is well-developed.  HENT:     Head: Normocephalic and atraumatic.     Right Ear: External ear normal.     Left Ear: External ear normal.     Mouth/Throat:     Pharynx: No oropharyngeal exudate or posterior oropharyngeal erythema.  Eyes:  Pupils: Pupils are equal, round, and reactive to light.  Cardiovascular:     Rate and Rhythm: Normal rate and regular rhythm.     Heart sounds: No murmur heard. Pulmonary:     Effort: No respiratory distress.     Breath sounds: Normal breath sounds.  Musculoskeletal:     Cervical back: Normal range of motion and neck supple.  Neurological:     Mental Status: He is alert and oriented to person, place, and time.     Diabetic Foot Exam - Simple   Simple Foot Form Diabetic Foot exam was performed with the following findings: Yes 08/04/2022  1:08 PM  Visual Inspection No deformities, no ulcerations,  no other skin breakdown bilaterally: Yes Sensation Testing Intact to touch and monofilament testing bilaterally: Yes Pulse Check Posterior Tibialis and Dorsalis pulse intact bilaterally: Yes Comments     Lab Results  Component Value Date   HGBA1C 7.1 (H) 08/04/2022   HGBA1C 7.5 (H) 03/12/2022   HGBA1C 7.2 (H) 11/10/2021    Assessment & Plan:   Peter Mathis was seen today for medical management of chronic issues.  Diagnoses and all orders for this visit:  Type 2 diabetes mellitus without complication, without long-term current use of insulin (HCC)  Other orders -     atorvastatin (LIPITOR) 40 MG tablet; Take 1 tablet (40 mg total) by mouth daily. TAKE 1/2 TABLET BY MOUTH EVERY DAY AT BEDTIME -     lisinopril (ZESTRIL) 5 MG tablet; Take 1 tablet (5 mg total) by mouth daily. -     metFORMIN (GLUCOPHAGE) 500 MG tablet; Take 2 tablets (1,000 mg total) by mouth 2 (two) times daily with a meal. -     sitaGLIPtin (JANUVIA) 100 MG tablet; Take 1 tablet (100 mg total) by mouth daily.   I am having Peter Mathis maintain his aspirin EC, Vitamin D3, OneTouch Verio, Krill Oil, multivitamin with minerals, Visine Dry Eye, fluticasone, sildenafil, atorvastatin, lisinopril, metFORMIN, and sitaGLIPtin.  Meds ordered this encounter  Medications   atorvastatin (LIPITOR) 40 MG tablet    Sig: Take 1 tablet (40 mg total) by mouth daily. TAKE 1/2 TABLET BY MOUTH EVERY DAY AT BEDTIME    Dispense:  90 tablet    Refill:  3   lisinopril (ZESTRIL) 5 MG tablet    Sig: Take 1 tablet (5 mg total) by mouth daily.    Dispense:  90 tablet    Refill:  3   metFORMIN (GLUCOPHAGE) 500 MG tablet    Sig: Take 2 tablets (1,000 mg total) by mouth 2 (two) times daily with a meal.    Dispense:  360 tablet    Refill:  3   sitaGLIPtin (JANUVIA) 100 MG tablet    Sig: Take 1 tablet (100 mg total) by mouth daily.    Dispense:  90 tablet    Refill:  1    EMERGENCY HOLDOVER PROVIDED: GT#[id number]. RPH GAVE 3 JANUVIA 100  MG TABLET. MD CONTACTED FOR AUTHORIZATION ON 02/13/2021     Follow-up: Return in about 3 months (around 11/04/2022).  [name], M.D.

## 2022-08-05 MED ORDER — ATORVASTATIN CALCIUM 40 MG PO TABS: 40.0000 mg | ORAL_TABLET | Freq: Every day | ORAL | 3 refills | Status: DC

## 2022-08-05 NOTE — Telephone Encounter (Signed)
2 sets of directions  Pharmacy comment: Script Clarification:PLEASE RESEND WITH CORRECT SIG. 1/2 TO 1 TAB AT BEDTIME?

## 2022-08-05 NOTE — Addendum Note (Signed)
Addended by: Antonietta Barcelona D on: 08/05/2022 08:06 AM   Modules accepted: Orders

## 2022-08-05 NOTE — Progress Notes (Signed)
Hello Yaron,  Your lab result is normal and/or stable.Some minor variations that are not significant are commonly marked abnormal, but do not represent any medical problem for you.  Best regards, [name], M.D.

## 2022-08-17 LAB — HM DIABETES EYE EXAM

## 2022-11-03 ENCOUNTER — Ambulatory Visit (INDEPENDENT_AMBULATORY_CARE_PROVIDER_SITE_OTHER): Payer: PPO

## 2022-11-03 DIAGNOSIS — Z0000 Encounter for general adult medical examination without abnormal findings: Secondary | ICD-10-CM

## 2022-11-03 NOTE — Progress Notes (Signed)
Subjective:   Peter Mathis is a 71 y.o. male who presents for Medicare Annual/Subsequent preventive examination.  I connected with  Peter Mathis on 11/03/22 by a audio enabled telemedicine application and verified that I am speaking with the correct person using two identifiers.  Patient Location: Home  Provider Location: Office/Clinic  I discussed the limitations of evaluation and management by telemedicine. The patient expressed understanding and agreed to proceed.   Review of Systems    Defer to PCP   Cardiac Risk Factors include: advanced age (>37mn, >>70women);diabetes mellitus;dyslipidemia;hypertension;male gender     Objective:    There were no vitals filed for this visit. There is no height or weight on file to calculate BMI.     11/03/2022    3:36 PM 10/31/2021    2:11 PM 04/11/2020   10:09 AM 04/03/2020    9:44 AM 04/01/2020    2:25 PM 12/01/2017   10:37 AM 07/02/2016    9:56 AM  Advanced Directives  Does Patient Have a Medical Advance Directive? _0  No No  Does patient want to make changes to medical advance directive?  No - Patient declined       Would patient like information on creating a medical advance directive? No - Patient declined No - Patient declined No - Patient declined No - Patient declined No - Patient declined Yes (ED - Information included in AVS) No - patient declined information    Current Medications (verified) Outpatient Encounter Medications as of 11/03/2022  Medication Sig   aspirin 81 MG EC tablet Take 81 mg by mouth daily.   atorvastatin (LIPITOR) 40 MG tablet Take 1 tablet (40 mg total) by mouth daily.   Blood Glucose Monitoring Suppl (ONETOUCH VERIO) w/Device KIT 1 Stick by Does not apply route 2 (two) times daily.   Cholecalciferol (VITAMIN D3) 5000 UNITS CAPS Take 5,000 Units by mouth daily.   fluticasone (FLONASE) 50 MCG/ACT nasal spray Place 2 sprays into both nostrils daily.   Glycerin-Hypromellose-PEG 400 (VISINE  DRY EYE) 0.2-0.2-1 % SOLN Place 1-2 drops into both eyes 4 (four) times daily as needed.   Krill Oil 1000 MG CAPS Take 1,000 mg by mouth daily.   lisinopril (ZESTRIL) 5 MG tablet Take 1 tablet (5 mg total) by mouth daily.   metFORMIN (GLUCOPHAGE) 500 MG tablet Take 2 tablets (1,000 mg total) by mouth 2 (two) times daily with a meal.   Multiple Vitamin (MULTIVITAMIN WITH MINERALS) TABS tablet Take 1 tablet by mouth daily.   sildenafil (REVATIO) 20 MG tablet TAKE 2-5 TABLETS AS NEEDED PRIOR TO SEXUAL ACTIVITY   sitaGLIPtin (JANUVIA) 100 MG tablet Take 1 tablet (100 mg total) by mouth daily.   No facility-administered encounter medications on file as of 11/03/2022.    Allergies (verified) Penicillins   History: Past Medical History:  Diagnosis Date   Chest discomfort 1989   Diverticulosis    DM type 2 (diabetes mellitus, type 2) (HCC)    Heart murmur    History of kidney stones    Hyperlipidemia    Past Surgical History:  Procedure Laterality Date   GANGLION CYST EXCISION Right    INGUINAL HERNIA REPAIR Left 04/03/2020   Procedure: HERNIA REPAIR INGUINAL ADULT WITH MESH;  Surgeon: BVirl Cagey MD;  Location: [facility];  Service: General;  Laterality: Left;   TONSILLECTOMY AND ADENOIDECTOMY  01/1955   Family History  Problem Relation Age of Onset   Diabetes Mother  Heart attack Father    Colon cancer Neg Hx    Social History   Socioeconomic History   Marital status: Married    Spouse name: Not on file   Number of children: 3   Years of education: Not on file   Highest education level: Not on file  Occupational History   Occupation: retired  Tobacco Use   Smoking status: Never   Smokeless tobacco: Never  Vaping Use   Vaping Use: Never used  Substance and Sexual Activity   Alcohol use: No   Drug use: No   Sexual activity: Yes  Other Topics Concern   Not on file  Social History Narrative   Lives in one level home with basement.   Both parents live next door    Daughters and grandchildren visit often   Still very active and independent   Social Determinants of Health   Financial Resource Strain: Low Risk  (11/03/2022)   Overall Financial Resource Strain (CARDIA)    Difficulty of Paying Living Expenses: Not hard at all  Food Insecurity: No Food Insecurity (11/03/2022)   Hunger Vital Sign    Worried About Running Out of Food in the Last Year: Never true    [place] in the Last Year: Never true  Transportation Needs: No Transportation Needs (11/03/2022)   PRAPARE - Hydrologist (Medical): No    Lack of Transportation (Non-Medical): No  Physical Activity: Sufficiently Active (11/03/2022)   Exercise Vital Sign    Days of Exercise per Week: 6 days    Minutes of Exercise per Session: 40 min  Stress: No Stress Concern Present (11/03/2022)   [place]    Feeling of Stress : Not at all  Social Connections: [place] (11/03/2022)   Social Connection and Isolation Panel [NHANES]    Frequency of Communication with Friends and Family: More than three times a week    Frequency of Social Gatherings with Friends and Family: More than three times a week    Attends Religious Services: More than 4 times per year    Active Member of [employer] or Organizations: Yes    Attends Music therapist: More than 4 times per year    Marital Status: Married    Clinical Intake:  Pre-visit preparation completed: Yes  Pain : No/denies pain     Nutritional Risks: None Diabetes: Yes CBG done?: No Did pt. bring in CBG monitor from home?: No  How often do you need to have someone help you when you read instructions, pamphlets, or other written materials from your doctor or pharmacy?: 1 - Never What is the last grade level you completed in school?: Associates Degree  Diabetic?  Yes Nutrition Risk Assessment:  Has the patient had any N/V/D within the  last 2 months?  No  Does the patient have any non-healing wounds?  No  Has the patient had any unintentional weight loss or weight gain?  No   Diabetes:  Is the patient diabetic?  Yes  If diabetic, was a CBG obtained today?   NA Did the patient bring in their glucometer from home?   NA How often do you monitor your CBG's? Twice a week.   Financial Strains and Diabetes Management:  Are you having any financial strains with the device, your supplies or your medication? No .  Does the patient want to be seen by Chronic Care Management for management  of their diabetes?  No  Would the patient like to be referred to a Nutritionist or for Diabetic Management?  No   Diabetic Exams:  Diabetic Eye Exam: Completed July 2023 Diabetic Foot Exam: Completed 08/04/2022    Interpreter Needed?: No  Information entered by :: Felicity Coyer LPN   Activities of Daily Living    11/03/2022    3:37 PM  In your present state of health, do you have any difficulty performing the following activities:  Hearing? 0  Vision? 0  Difficulty concentrating or making decisions? 0  Walking or climbing stairs? 0  Dressing or bathing? 0  Doing errands, shopping? 0  Preparing Food and eating ? N  Using the Toilet? N  In the past six months, have you accidently leaked urine? N  Do you have problems with loss of bowel control? N  Managing your Medications? N  Managing your Finances? N  Housekeeping or managing your Housekeeping? N    Patient Care Team: [name], MD as PCP - General (Family Medicine) Larey Dresser, MD as Consulting Physician (Cardiology) Shea Evans [name], LCSW as [place] (Licensed Clinical Social Worker)  Indicate any recent [place] you may have received from other than [facility] providers in the past year (date may be approximate).     Assessment:   This is a routine wellness examination for Din.  Hearing/Vision screen No results  found.  Dietary issues and exercise activities discussed: Current Exercise Habits: The patient does not participate in regular exercise at present, Exercise limited by: None identified   Goals Addressed             This Visit's Progress    Patient Stated       11/03/2022 AWV Goal: Fall Prevention  Over the next year, patient will decrease their risk for falls by: Using assistive devices, such as a cane or walker, as needed Identifying fall risks within their home and correcting them by: Removing throw rugs Adding handrails to stairs or ramps Removing clutter and keeping a clear pathway throughout the home Increasing light, especially at night Adding shower handles/bars Raising toilet seat Identifying potential personal risk factors for falls: Medication side effects Incontinence/urgency Vestibular dysfunction Hearing loss Musculoskeletal disorders Neurological disorders Orthostatic hypotension         Depression Screen    11/03/2022    3:35 PM 08/04/2022   12:59 PM 06/03/2022   10:50 AM 04/14/2022    3:42 PM 04/14/2022    3:40 PM 03/12/2022    8:57 AM 11/10/2021    8:10 AM  PHQ 2/9 Scores  PHQ - 2 Score 0 0 _0 0 0  PHQ- 9 Score   6 5       Fall Risk    11/03/2022    3:41 PM 08/04/2022   12:59 PM 03/12/2022    8:57 AM 11/10/2021    7:50 AM 10/31/2021    2:21 PM  Fall Risk   Falls in the past year? 0 0 0 0 0  Number falls in past yr:     0  Injury with Fall?     0  Risk for fall due to :     No Fall Risks  Follow up Falls evaluation completed    Falls prevention discussed    [place]:  Any stairs in or around the home? Yes  If so, are there any without handrails? No Home free of  loose throw rugs in walkways, pet beds, electrical cords, etc? Yes  Adequate lighting in your home to reduce risk of falls? Yes   ASSISTIVE DEVICES UTILIZED TO PREVENT FALLS:  Life alert? No  Use of a cane, walker or w/c? No  Grab bars in the  bathroom? No  Shower chair or bench in shower? No  Elevated toilet seat or a handicapped toilet? No   TIMED UP AND GO:  Was the test performed? No .   Cognitive Function:    12/01/2017    1:23 PM  MMSE - Mini Mental State Exam  Orientation to time 5  Orientation to Place 5  Registration 3  Attention/ Calculation 5  Recall 3  Language- name 2 objects 2  Language- repeat 1  Language- follow 3 step command 3  Language- read & follow direction 1  Write a sentence 1  Copy design 1  Total score 30        11/03/2022    3:40 PM 10/31/2021    2:23 PM  6CIT Screen  What Year? 0 points 0 points  What month? 0 points 0 points  What time? 0 points 0 points  Count back from 20 0 points 0 points  Months in reverse 0 points 0 points  Repeat phrase 0 points 0 points  Total Score 0 points 0 points    Immunizations Immunization History  Administered Date(s) Administered   Fluad Quad(high Dose 65+) 10/04/2019, 10/28/2020, 11/10/2021   Influenza, High Dose Seasonal PF 11/15/2015, 10/21/2016, 10/12/2017, 10/04/2018   Moderna Sars-Covid-2 Vaccination 02/02/2020, 03/02/2020, 11/28/2020   Pneumococcal Conjugate-13 05/02/2015   Pneumococcal Polysaccharide-23 07/26/2017   Tdap 05/05/2016   Zoster Recombinat (Shingrix) 09/07/2017, 01/25/2018    TDAP status: Up to date  Flu Vaccine status: Due, Education has been provided regarding the importance of this vaccine. Advised may receive this vaccine at local pharmacy or Health Dept. Aware to provide a copy of the vaccination record if obtained from local pharmacy or Health Dept. Verbalized acceptance and understanding.  Pneumonia vaccine:  Due for Prevnar 20  Covid-19 vaccine status: Completed vaccines  Qualifies for Shingles Vaccine? Yes   Zostavax completed No   Shingrix Completed?: Yes  Screening Tests Health Maintenance  Topic Date Due   COVID-19 Vaccine (4 - Moderna risk series) 01/23/2021   Diabetic kidney evaluation - Urine  ACR  10/28/2021   Medicare Annual Wellness (AWV)  10/31/2022   INFLUENZA VACCINE  03/28/2023 (Originally 07/28/2022)   HEMOGLOBIN A1C  02/04/2023   Diabetic kidney evaluation - GFR measurement  08/05/2023   FOOT EXAM  08/05/2023   OPHTHALMOLOGY EXAM  08/18/2023   TETANUS/TDAP  05/05/2026   COLONOSCOPY (Pts 45-50yr Insurance coverage will need to be confirmed)  07/22/2026   Pneumonia Vaccine 72 Years old  Completed   Hepatitis C Screening  Completed   Zoster Vaccines- Shingrix  Completed   HPV VACCINES  Aged Out    Health Maintenance  Health Maintenance Due  Topic Date Due   COVID-19 Vaccine (4 - Moderna risk series) 01/23/2021   Diabetic kidney evaluation - Urine ACR  10/28/2021   Medicare Annual Wellness (AWV)  10/31/2022    Colorectal cancer screening: Type of screening: Colonoscopy. Completed 07/22/2016. Repeat every ten years  Lung Cancer Screening: (Low Dose CT Chest recommended if Age 72-80years, 30 pack-year currently smoking OR have quit w/in 15years.) does not qualify.   Additional Screening:  Hepatitis C Screening: does not qualify  Vision Screening: Recommended annual ophthalmology exams  for early detection of glaucoma and other disorders of the eye. Is the patient up to date with their annual eye exam?  Yes  Who is the provider or what is the name of the office in which the patient attends annual eye exams? Dr. Eulas Post, [facility] [place], [place] If pt is not established with a provider, would they like to be referred to a provider to establish care?  NA .   Dental Screening: Recommended annual dental exams for proper oral hygiene  Community Resource Referral / Chronic Care Management: CRR required this visit?  No   CCM required this visit?  No      Plan:     I have personally reviewed and noted the following in the patient's chart:   Medical and social history Use of alcohol, tobacco or illicit drugs  Current medications and supplements  including opioid prescriptions. Patient is not currently taking opioid prescriptions. Functional ability and status Nutritional status Physical activity Advanced directives List of other physicians Hospitalizations, surgeries, and ER visits in previous 12 months Vitals Screenings to include cognitive, depression, and falls Referrals and appointments  In addition, I have reviewed and discussed with patient certain preventive protocols, quality metrics, and best practice recommendations. A written personalized care plan for preventive services as well as general preventive health recommendations were provided to patient.     Felicity Coyer LPN     26/07/3418   Nurse Notes: Patient is due for Flu & Prevnar 20 vaccine.

## 2022-11-03 NOTE — Patient Instructions (Signed)
[name] , Thank you for taking time to come for your Medicare Wellness Visit. I appreciate your ongoing commitment to your health goals. Please review the following plan we discussed and let me know if I can assist you in the future.   These are the goals we discussed:  Goals       Client wil talk with LCSW in next 4 weeks to discuss vision challenges of client and client completion of ADLs (pt-stated)      CARE PLAN ENTRY   Current Barriers:  Type 2 DM in client with chronic diagnoses of BPH, Vitamin D deficiency, Diabetic Retinopathy, HLD Vision challenges  Clinical Social Work Clinical Goal(s):  LCSW to call client in next 4 weeks to talk with client about vision challlenges of client and client completion of ADLs  Interventions   Talked with client about CCM program services Talked with client about RNCM support with CCM program Talked with client about social support network Talked with client about ambulation challenges of client Talked with client about transport needs of client Talked with client about relaxation techniques (likes to be outdoors and do outdoor activities) Talked with client about his upcoming medical appointments Talked with client about his recovery from hernia surgery (he said he had hernia surgery in April of 2021)  Patient Self Care Activities:  Attends scheduled medical appointments Completes ADLs independently  Drives to appointments and to complete errands needed  Patient Self Care Deficits:  Vision challenges Management of Type 2 DM   Initial goal documentation       DIET - INCREASE WATER INTAKE      Increase water intake to 6-8 glasses per day.      Exercise 150 min/wk Moderate Activity      Join silver sneakers and attend 3 times per week for 1 hour.      Manage My Emotions; Complete ADLs daily as able; Manage vision issues      Timeframe:  Short-Term Goal Priority:  Medium Progress: On Track Start Date:            04/14/22                 Expected End Date:          07/13/22            Follow Up Date  LCSW is discharging client today from [facility] emotions: Complete ADLs daily as able . Manage vision issues   Why is this important?   When you are stressed, down or upset, your body reacts too.  For example, your blood pressure may get higher; you may have a headache or stomachache.  When your emotions get the best of you, your body's ability to fight off cold and flu gets weak.  These steps will help you manage your emotions.     Patient Coping Skills: Completes ADLs daily, as able Attends scheduled medical appointments Takes medications as prescribed  Patient Deficits: Vision challenges  Patient Goals:  In next 30 days,patient will: Call RNCM or LCSW as needed for CCM program support Attend scheduled medical appointments Talk with Leonette Nutting, regularly, about client needs -  Follow Up Plan: LCSW  is discharging client today from [facility]. Client agrees with this plan      Patient Stated      11/03/2022 AWV Goal: Fall Prevention  Over the next year, patient will decrease their risk for falls by: Using assistive devices, such  as a cane or walker, as needed Identifying fall risks within their home and correcting them by: Removing throw rugs Adding handrails to stairs or ramps Removing clutter and keeping a clear pathway throughout the home Increasing light, especially at night Adding shower handles/bars Raising toilet seat Identifying potential personal risk factors for falls: Medication side effects Incontinence/urgency Vestibular dysfunction Hearing loss Musculoskeletal disorders Neurological disorders Orthostatic hypotension          This is a list of the screening recommended for you and due dates:  Health Maintenance  Topic Date Due   COVID-19 Vaccine (4 - Moderna risk series) 01/23/2021   Yearly kidney health urinalysis for diabetes  10/28/2021    Medicare Annual Wellness Visit  10/31/2022   Flu Shot  03/28/2023*   Hemoglobin A1C  02/04/2023   Yearly kidney function blood test for diabetes  08/05/2023   Complete foot exam   08/05/2023   Eye exam for diabetics  08/18/2023   Tetanus Vaccine  05/05/2026   Colon Cancer Screening  07/22/2026   Pneumonia Vaccine  Completed   Hepatitis C Screening: USPSTF Recommendation to screen - Ages 72-79 yo.  Completed   Zoster (Shingles) Vaccine  Completed   HPV Vaccine  Aged Out  *Topic was postponed. The date shown is not the original due date.

## 2022-11-10 ENCOUNTER — Ambulatory Visit: Payer: PPO | Admitting: Family Medicine

## 2023-01-05 ENCOUNTER — Ambulatory Visit (INDEPENDENT_AMBULATORY_CARE_PROVIDER_SITE_OTHER): Payer: PPO | Admitting: Family Medicine

## 2023-01-05 ENCOUNTER — Encounter: Payer: Self-pay | Admitting: Family Medicine

## 2023-01-05 VITALS — BP 145/78 | HR 76 | Temp 98.1°F | Ht 71.0 in | Wt 177.0 lb

## 2023-01-05 DIAGNOSIS — E119 Type 2 diabetes mellitus without complications: Secondary | ICD-10-CM

## 2023-01-05 DIAGNOSIS — E782 Mixed hyperlipidemia: Secondary | ICD-10-CM

## 2023-01-05 LAB — BAYER DCA HB A1C WAIVED: HB A1C (BAYER DCA - WAIVED): 9.1 % — ABNORMAL HIGH (ref 4.8–5.6)

## 2023-01-05 LAB — LIPID PANEL

## 2023-01-05 MED ORDER — DAPAGLIFLOZIN PROPANEDIOL 10 MG PO TABS: 10.0000 mg | ORAL_TABLET | Freq: Every day | ORAL | 3 refills | Status: DC

## 2023-01-05 NOTE — Progress Notes (Signed)
Subjective:  Patient ID: Peter Mathis, male    DOB: 11-Sep-1951  Age: 72 y.o. MRN: [id number]  CC: Medical Management of Chronic Issues   HPI Peter Mathis presents forFollow-up of diabetes. Patient checks blood sugar at home.  Patient denies symptoms such as polyuria, polydipsia, excessive hunger, nausea No significant hypoglycemic spells noted. Medications reviewed. Pt reports taking them regularly without complication/adverse reaction being reported today.    History Peter Mathis has a past medical history of Chest discomfort (1989), Diverticulosis, DM type 2 (diabetes mellitus, type 2) ([place]), Heart murmur, History of kidney stones, and Hyperlipidemia.   Peter Mathis has a past surgical history that includes Tonsillectomy and adenoidectomy (01/1955); Ganglion cyst excision (Right); and Inguinal hernia repair (Left, 04/03/2020).   His family history includes Diabetes in his mother; Heart attack in his father.Peter Mathis reports that Peter Mathis has never smoked. Peter Mathis has never used smokeless tobacco. Peter Mathis reports that Peter Mathis does not drink alcohol and does not use drugs.  Current Outpatient Medications on File Prior to Visit  Medication Sig Dispense Refill   aspirin 81 MG EC tablet Take 81 mg by mouth daily.     atorvastatin (LIPITOR) 40 MG tablet Take 1 tablet (40 mg total) by mouth daily. 90 tablet 3   Blood Glucose Monitoring Suppl (ONETOUCH VERIO) w/Device KIT 1 Stick by Does not apply route 2 (two) times daily. 1 kit 0   Cholecalciferol (VITAMIN D3) 5000 UNITS CAPS Take 5,000 Units by mouth daily.     fluticasone (FLONASE) 50 MCG/ACT nasal spray Place 2 sprays into both nostrils daily. 16 g 6   Glycerin-Hypromellose-PEG 400 (VISINE DRY EYE) 0.2-0.2-1 % SOLN Place 1-2 drops into both eyes 4 (four) times daily as needed.     Krill Oil 1000 MG CAPS Take 1,000 mg by mouth daily.     lisinopril (ZESTRIL) 5 MG tablet Take 1 tablet (5 mg total) by mouth daily. 90 tablet 3   metFORMIN (GLUCOPHAGE) 500 MG tablet Take 2  tablets (1,000 mg total) by mouth 2 (two) times daily with a meal. 360 tablet 3   Multiple Vitamin (MULTIVITAMIN WITH MINERALS) TABS tablet Take 1 tablet by mouth daily.     sildenafil (REVATIO) 20 MG tablet TAKE 2-5 TABLETS AS NEEDED PRIOR TO SEXUAL ACTIVITY 50 tablet 3   sitaGLIPtin (JANUVIA) 100 MG tablet Take 1 tablet (100 mg total) by mouth daily. 90 tablet 1   No current facility-administered medications on file prior to visit.    ROS Review of Systems  Constitutional:  Negative for fever.  Respiratory:  Negative for shortness of breath.   Cardiovascular:  Negative for chest pain.  Musculoskeletal:  Negative for arthralgias.  Skin:  Negative for rash.    Objective:  BP (!) 145/78   Pulse 76   Temp 98.1 F (36.7 C)   Ht '5\' 11"'$  (1.803 m)   Wt 177 lb (80.3 kg)   SpO2 99%   BMI 24.69 kg/m   BP Readings from Last 3 Encounters:  01/05/23 (!) 145/78  08/04/22 (!) 141/70  03/12/22 128/70    Wt Readings from Last 3 Encounters:  01/05/23 177 lb (80.3 kg)  08/04/22 175 lb (79.4 kg)  03/12/22 174 lb 12.8 oz (79.3 kg)     Physical Exam Vitals reviewed.  Constitutional:      Appearance: Peter Mathis is well-developed.  HENT:     Head: Normocephalic and atraumatic.     Right Ear: External ear normal.     Left Ear: External ear  normal.     Mouth/Throat:     Pharynx: No oropharyngeal exudate or posterior oropharyngeal erythema.  Eyes:     Pupils: Pupils are equal, round, and reactive to light.  Cardiovascular:     Rate and Rhythm: Normal rate and regular rhythm.     Heart sounds: No murmur heard. Pulmonary:     Effort: No respiratory distress.     Breath sounds: Normal breath sounds.  Musculoskeletal:     Cervical back: Normal range of motion and neck supple.  Neurological:     Mental Status: Peter Mathis is alert and oriented to person, place, and time.       Assessment & Plan:   Peter Mathis was seen today for medical management of chronic issues.  Diagnoses and all orders for  this visit:  Type 2 diabetes mellitus without complication, without long-term current use of insulin (HCC) -     Microalbumin / creatinine urine ratio -     Bayer DCA Hb A1c Waived -     CBC with Differential/Platelet -     CMP14+EGFR  Mixed hyperlipidemia -     Lipid panel    Discussed diet improvements  I am having Peter Mathis maintain his aspirin EC, Vitamin D3, OneTouch Verio, Krill Oil, multivitamin with minerals, Visine Dry Eye, fluticasone, sildenafil, lisinopril, metFORMIN, sitaGLIPtin, and atorvastatin.  No orders of the defined types were placed in this encounter.    Follow-up: No follow-ups on file.  [name], M.D.

## 2023-01-06 LAB — CBC WITH DIFFERENTIAL/PLATELET
Basophils Absolute: 0 10*3/uL (ref 0.0–0.2)
Basos: 0 %
EOS (ABSOLUTE): 0.1 10*3/uL (ref 0.0–0.4)
Eos: 1 %
Hematocrit: 47.2 % (ref 37.5–51.0)
Hemoglobin: 15.5 g/dL (ref 13.0–17.7)
Immature Grans (Abs): 0 10*3/uL (ref 0.0–0.1)
Immature Granulocytes: 0 %
Lymphocytes Absolute: 2.7 10*3/uL (ref 0.7–3.1)
Lymphs: 26 %
MCH: 29.2 pg (ref 26.6–33.0)
MCHC: 32.8 g/dL (ref 31.5–35.7)
MCV: 89 fL (ref 79–97)
Monocytes Absolute: 0.8 10*3/uL (ref 0.1–0.9)
Monocytes: 8 %
Neutrophils Absolute: 6.8 10*3/uL (ref 1.4–7.0)
Neutrophils: 65 %
Platelets: 265 10*3/uL (ref 150–450)
RBC: 5.31 x10E6/uL (ref 4.14–5.80)
RDW: 12.4 % (ref 11.6–15.4)
WBC: 10.5 10*3/uL (ref 3.4–10.8)

## 2023-01-06 LAB — CMP14+EGFR
ALT: 22 IU/L (ref 0–44)
AST: 17 IU/L (ref 0–40)
Albumin/Globulin Ratio: 1.8 (ref 1.2–2.2)
Albumin: 4.6 g/dL (ref 3.8–4.8)
Alkaline Phosphatase: 49 IU/L (ref 44–121)
BUN/Creatinine Ratio: 17 (ref 10–24)
BUN: 15 mg/dL (ref 8–27)
Bilirubin Total: 0.5 mg/dL (ref 0.0–1.2)
CO2: 24 mmol/L (ref 20–29)
Calcium: 9.6 mg/dL (ref 8.6–10.2)
Chloride: 98 mmol/L (ref 96–106)
Creatinine, Ser: 0.9 mg/dL (ref 0.76–1.27)
Globulin, Total: 2.6 g/dL (ref 1.5–4.5)
Glucose: 167 mg/dL — ABNORMAL HIGH (ref 70–99)
Potassium: 4.8 mmol/L (ref 3.5–5.2)
Sodium: 136 mmol/L (ref 134–144)
Total Protein: 7.2 g/dL (ref 6.0–8.5)
eGFR: 91 mL/min/{1.73_m2} (ref >59)

## 2023-01-06 LAB — LIPID PANEL
Chol/HDL Ratio: 2.6 ratio (ref 0.0–5.0)
Cholesterol, Total: 102 mg/dL (ref 100–199)
HDL: 39 mg/dL — ABNORMAL LOW (ref >39)
LDL Chol Calc (NIH): 45 mg/dL (ref 0–99)
Triglycerides: 91 mg/dL (ref 0–149)
VLDL Cholesterol Cal: 18 mg/dL (ref 5–40)

## 2023-01-06 LAB — MICROALBUMIN / CREATININE URINE RATIO
Creatinine, Urine: 17.8 mg/dL
Microalb/Creat Ratio: <17 mg/g creat (ref 0–29)
Microalbumin, Urine: <3 ug/mL

## 2023-03-01 ENCOUNTER — Encounter: Payer: Self-pay | Admitting: Family Medicine

## 2023-04-30 NOTE — Addendum Note (Signed)
Addended by: Quay Burow on: 04/30/2023 01:46 PM   Modules accepted: Level of Service

## 2023-05-06 ENCOUNTER — Ambulatory Visit (INDEPENDENT_AMBULATORY_CARE_PROVIDER_SITE_OTHER): Payer: PPO | Admitting: Family Medicine

## 2023-05-06 ENCOUNTER — Encounter: Payer: Self-pay | Admitting: Family Medicine

## 2023-05-06 VITALS — BP 127/57 | HR 74 | Temp 97.7°F | Ht 71.0 in | Wt 168.8 lb

## 2023-05-06 DIAGNOSIS — E119 Type 2 diabetes mellitus without complications: Secondary | ICD-10-CM

## 2023-05-06 DIAGNOSIS — Z7984 Long term (current) use of oral hypoglycemic drugs: Secondary | ICD-10-CM | POA: Diagnosis not present

## 2023-05-06 DIAGNOSIS — E782 Mixed hyperlipidemia: Secondary | ICD-10-CM

## 2023-05-06 LAB — CBC WITH DIFFERENTIAL/PLATELET
Basophils Absolute: 0 10*3/uL (ref 0.0–0.2)
Basos: 1 %
EOS (ABSOLUTE): 0.1 10*3/uL (ref 0.0–0.4)
Eos: 1 %
Hematocrit: 47.2 % (ref 37.5–51.0)
Hemoglobin: 15.5 g/dL (ref 13.0–17.7)
Immature Grans (Abs): 0 10*3/uL (ref 0.0–0.1)
Immature Granulocytes: 0 %
Lymphocytes Absolute: 2.4 10*3/uL (ref 0.7–3.1)
Lymphs: 27 %
MCH: 29.5 pg (ref 26.6–33.0)
MCHC: 32.8 g/dL (ref 31.5–35.7)
MCV: 90 fL (ref 79–97)
Monocytes Absolute: 0.8 10*3/uL (ref 0.1–0.9)
Monocytes: 9 %
Neutrophils Absolute: 5.5 10*3/uL (ref 1.4–7.0)
Neutrophils: 62 %
Platelets: 299 10*3/uL (ref 150–450)
RBC: 5.26 x10E6/uL (ref 4.14–5.80)
RDW: 13.5 % (ref 11.6–15.4)
WBC: 8.8 10*3/uL (ref 3.4–10.8)

## 2023-05-06 LAB — CMP14+EGFR
ALT: 18 IU/L (ref 0–44)
AST: 16 IU/L (ref 0–40)
Albumin/Globulin Ratio: 1.8 (ref 1.2–2.2)
Albumin: 4.4 g/dL (ref 3.8–4.8)
Alkaline Phosphatase: 52 IU/L (ref 44–121)
BUN/Creatinine Ratio: 18 (ref 10–24)
BUN: 19 mg/dL (ref 8–27)
Bilirubin Total: 0.5 mg/dL (ref 0.0–1.2)
CO2: 24 mmol/L (ref 20–29)
Calcium: 10.1 mg/dL (ref 8.6–10.2)
Chloride: 98 mmol/L (ref 96–106)
Creatinine, Ser: 1.04 mg/dL (ref 0.76–1.27)
Globulin, Total: 2.5 g/dL (ref 1.5–4.5)
Glucose: 132 mg/dL — ABNORMAL HIGH (ref 70–99)
Potassium: 4.9 mmol/L (ref 3.5–5.2)
Sodium: 136 mmol/L (ref 134–144)
Total Protein: 6.9 g/dL (ref 6.0–8.5)
eGFR: 76 mL/min/{1.73_m2} (ref >59)

## 2023-05-06 LAB — LIPID PANEL
Chol/HDL Ratio: 2.6 ratio (ref 0.0–5.0)
Cholesterol, Total: 90 mg/dL — ABNORMAL LOW (ref 100–199)
HDL: 34 mg/dL — ABNORMAL LOW (ref >39)
LDL Chol Calc (NIH): 37 mg/dL (ref 0–99)
Triglycerides: 97 mg/dL (ref 0–149)
VLDL Cholesterol Cal: 19 mg/dL (ref 5–40)

## 2023-05-06 LAB — BAYER DCA HB A1C WAIVED: HB A1C (BAYER DCA - WAIVED): 7.7 % — ABNORMAL HIGH (ref 4.8–5.6)

## 2023-05-06 MED ORDER — SITAGLIPTIN PHOSPHATE 100 MG PO TABS
100.0000 mg | ORAL_TABLET | Freq: Every day | ORAL | 3 refills | Status: DC
Start: 2023-05-06 — End: 2024-03-16

## 2023-05-06 NOTE — Progress Notes (Signed)
Subjective:  Patient ID: Peter Mathis, male    DOB: 05/09/1951  Age: 72 y.o. MRN: [id number]  CC: Medical Management of Chronic Issues   HPI Peter Mathis presents forFollow-up of diabetes. Patient checks blood sugar at home.   134- 140 fasting and 166 postprandial Patient denies symptoms such as polyuria, polydipsia, excessive hunger, nausea No significant hypoglycemic spells noted. Medications reviewed. Pt reports taking them regularly without complication/adverse reaction being reported today.    History Peter Mathis has a past medical history of Chest discomfort (1989), Diverticulosis, DM type 2 (diabetes mellitus, type 2) (HCC), Heart murmur, History of kidney stones, and Hyperlipidemia.   Peter Mathis has a past surgical history that includes Tonsillectomy and adenoidectomy (01/1955); Ganglion cyst excision (Right); and Inguinal hernia repair (Left, 04/03/2020).   His family history includes Diabetes in his mother; Heart attack in his father.Peter Mathis reports that Peter Mathis has never smoked. Peter Mathis has never used smokeless tobacco. Peter Mathis reports that Peter Mathis does not drink alcohol and does not use drugs.  Current Outpatient Medications on File Prior to Visit  Medication Sig Dispense Refill   aspirin 81 MG EC tablet Take 81 mg by mouth daily.     atorvastatin (LIPITOR) 40 MG tablet Take 1 tablet (40 mg total) by mouth daily. 90 tablet 3   Blood Glucose Monitoring Suppl (ONETOUCH VERIO) w/Device KIT 1 Stick by Does not apply route 2 (two) times daily. 1 kit 0   Cholecalciferol (VITAMIN D3) 5000 UNITS CAPS Take 5,000 Units by mouth daily.     fluticasone (FLONASE) 50 MCG/ACT nasal spray Place 2 sprays into both nostrils daily. 16 g 6   Glycerin-Hypromellose-PEG 400 (VISINE DRY EYE) 0.2-0.2-1 % SOLN Place 1-2 drops into both eyes 4 (four) times daily as needed.     Krill Oil 1000 MG CAPS Take 1,000 mg by mouth daily.     lisinopril (ZESTRIL) 5 MG tablet Take 1 tablet (5 mg total) by mouth daily. 90 tablet 3    metFORMIN (GLUCOPHAGE) 500 MG tablet Take 2 tablets (1,000 mg total) by mouth 2 (two) times daily with a meal. 360 tablet 3   Multiple Vitamin (MULTIVITAMIN WITH MINERALS) TABS tablet Take 1 tablet by mouth daily.     sildenafil (REVATIO) 20 MG tablet TAKE 2-5 TABLETS AS NEEDED PRIOR TO SEXUAL ACTIVITY 50 tablet 3   No current facility-administered medications on file prior to visit.    ROS Review of Systems  Constitutional:  Negative for fever.  Respiratory:  Negative for shortness of breath.   Cardiovascular:  Negative for chest pain.  Musculoskeletal:  Negative for arthralgias.  Skin:  Negative for rash.    Objective:  BP (!) 127/57   Pulse 74   Temp 97.7 F (36.5 C)   Ht 5\' 11"  (1.803 m)   Wt 168 lb 12.8 oz (76.6 kg)   SpO2 100%   BMI 23.54 kg/m   BP Readings from Last 3 Encounters:  05/06/23 (!) 127/57  01/05/23 (!) 145/78  08/04/22 (!) 141/70    Wt Readings from Last 3 Encounters:  05/06/23 168 lb 12.8 oz (76.6 kg)  01/05/23 177 lb (80.3 kg)  08/04/22 175 lb (79.4 kg)     Physical Exam Vitals reviewed.  Constitutional:      Appearance: Peter Mathis is well-developed.  HENT:     Head: Normocephalic and atraumatic.     Right Ear: External ear normal.     Left Ear: External ear normal.     Mouth/Throat:  Pharynx: No oropharyngeal exudate or posterior oropharyngeal erythema.  Eyes:     Pupils: Pupils are equal, round, and reactive to light.  Cardiovascular:     Rate and Rhythm: Normal rate and regular rhythm.     Heart sounds: No murmur heard. Pulmonary:     Effort: No respiratory distress.     Breath sounds: Normal breath sounds.  Musculoskeletal:     Cervical back: Normal range of motion and neck supple.  Neurological:     Mental Status: Peter Mathis is alert and oriented to person, place, and time.       Assessment & Plan:   [place] was seen today for medical management of chronic issues.  Diagnoses and all orders for this visit:  Type 2 diabetes mellitus  without complication, without long-term current use of insulin (HCC) -     Bayer DCA Hb A1c Waived -     CBC with Differential/Platelet -     CMP14+EGFR  Mixed hyperlipidemia -     Lipid panel  Other orders -     sitaGLIPtin (JANUVIA) 100 MG tablet; Take 1 tablet (100 mg total) by mouth daily.      I have discontinued Stone R. Genet's dapagliflozin propanediol. I am also having him start on sitaGLIPtin. Additionally, I am having him maintain his aspirin EC, Vitamin D3, OneTouch Verio, Krill Oil, multivitamin with minerals, Visine Dry Eye, fluticasone, sildenafil, lisinopril, metFORMIN, and atorvastatin.  Meds ordered this encounter  Medications   sitaGLIPtin (JANUVIA) 100 MG tablet    Sig: Take 1 tablet (100 mg total) by mouth daily.    Dispense:  90 tablet    Refill:  3     Follow-up: Return in about 3 months (around 08/06/2023) for diabetes.  Mechele Claude, M.D.

## 2023-07-15 DIAGNOSIS — H2513 Age-related nuclear cataract, bilateral: Secondary | ICD-10-CM | POA: Diagnosis not present

## 2023-07-15 DIAGNOSIS — E119 Type 2 diabetes mellitus without complications: Secondary | ICD-10-CM | POA: Diagnosis not present

## 2023-07-15 DIAGNOSIS — H4901 Third [oculomotor] nerve palsy, right eye: Secondary | ICD-10-CM | POA: Diagnosis not present

## 2023-07-15 DIAGNOSIS — H43811 Vitreous degeneration, right eye: Secondary | ICD-10-CM | POA: Diagnosis not present

## 2023-07-15 LAB — HM DIABETES EYE EXAM

## 2023-08-04 ENCOUNTER — Encounter: Payer: Self-pay | Admitting: Nurse Practitioner

## 2023-08-04 ENCOUNTER — Ambulatory Visit (INDEPENDENT_AMBULATORY_CARE_PROVIDER_SITE_OTHER): Payer: PPO | Admitting: Nurse Practitioner

## 2023-08-04 VITALS — BP 130/69 | HR 83 | Temp 97.5°F | Resp 20 | Ht 71.0 in | Wt 170.0 lb

## 2023-08-04 DIAGNOSIS — A084 Viral intestinal infection, unspecified: Secondary | ICD-10-CM

## 2023-08-04 NOTE — Progress Notes (Signed)
Subjective:    Patient ID: Peter Mathis, male    DOB: 1951/11/12, 72 y.o.   MRN: [id number]   Chief Complaint: Diarrhea, Vomiting (2 days ago. ), and Fever (Negative covid test/)   Diarrhea  Associated symptoms include a fever (99) and vomiting.  Fever  Associated symptoms include diarrhea (symptoms stopped 3 days ago), nausea and vomiting.   Patient come sin  today c/o diarrhea and vomiting 3 days ago. Lasted about 24 hours. Has not had any in the past 2 days. Says temp was 99 last night.   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  Gastrointestinal:  Positive for diarrhea (symptoms stopped 3 days ago), nausea and vomiting.       Objective:   Physical Exam Constitutional:      Appearance: Normal appearance.  Cardiovascular:     Rate and Rhythm: Normal rate and regular rhythm.     Heart sounds: Normal heart sounds.  Pulmonary:     Effort: Pulmonary effort is normal.     Breath sounds: Normal breath sounds.  Abdominal:     General: Abdomen is flat. Bowel sounds are normal.     Palpations: Abdomen is soft.     Tenderness: There is no abdominal tenderness.  Neurological:     General: No focal deficit  present.     Mental Status: He is alert and oriented to person, place, and time.  Psychiatric:        Mood and Affect: Mood normal.        Behavior: Behavior normal.    BP 130/69   Pulse 83   Temp (!) 97.5 F (36.4 C) (Temporal)   Resp 20   Ht 5\' 11"  (1.803 m)   Wt 170 lb (77.1 kg)   SpO2 98%   BMI 23.71 kg/m         Assessment & Plan:   [name] in today with chief complaint of Diarrhea, Vomiting (2 days ago. ), and Fever (Negative covid test/)   1. Viral gastroenteritis Mainly resolved First 24 Hours-Clear liquids  popsicles  Jello  gatorade  Sprite Second 24 hours-Add Full liquids ( Liquids you cant see through) Third 24 hours- Bland diet ( foods that are baked or broiled)  *avoiding fried foods and highly spiced foods* During these 3 days  Avoid milk, cheese, ice cream or any other dairy products  Avoid caffeine- REMEMBER Mt. Dew and Mello Yellow contain lots of caffeine You should eat and drink in  Frequent small volumes If no improvement in symptoms or worsen in 2-3 days should RETRUN TO OFFICE or go to ER!       The above assessment and management plan was discussed with the patient. The patient verbalized understanding  of and has agreed to the management plan. Patient is aware to call the clinic if symptoms persist or worsen. Patient is aware when to return to the clinic for a follow-up visit. Patient educated on when it is appropriate to go to the emergency department.   Peter Daphine Deutscher, FNP

## 2023-08-04 NOTE — Patient Instructions (Signed)

## 2023-08-11 ENCOUNTER — Ambulatory Visit (INDEPENDENT_AMBULATORY_CARE_PROVIDER_SITE_OTHER): Payer: PPO | Admitting: Family Medicine

## 2023-08-11 ENCOUNTER — Encounter: Payer: Self-pay | Admitting: Family Medicine

## 2023-08-11 VITALS — BP 126/56 | HR 73 | Temp 97.9°F | Ht 71.0 in | Wt 168.2 lb

## 2023-08-11 DIAGNOSIS — E782 Mixed hyperlipidemia: Secondary | ICD-10-CM

## 2023-08-11 DIAGNOSIS — E119 Type 2 diabetes mellitus without complications: Secondary | ICD-10-CM | POA: Diagnosis not present

## 2023-08-11 DIAGNOSIS — Z7984 Long term (current) use of oral hypoglycemic drugs: Secondary | ICD-10-CM | POA: Diagnosis not present

## 2023-08-11 LAB — CBC WITH DIFFERENTIAL/PLATELET
Basophils Absolute: 0 10*3/uL (ref 0.0–0.2)
Basos: 0 %
EOS (ABSOLUTE): 0.1 10*3/uL (ref 0.0–0.4)
Eos: 2 %
Hematocrit: 41.8 % (ref 37.5–51.0)
Hemoglobin: 14.1 g/dL (ref 13.0–17.7)
Immature Grans (Abs): 0 10*3/uL (ref 0.0–0.1)
Immature Granulocytes: 1 %
Lymphocytes Absolute: 2 10*3/uL (ref 0.7–3.1)
Lymphs: 33 %
MCH: 30.3 pg (ref 26.6–33.0)
MCHC: 33.7 g/dL (ref 31.5–35.7)
MCV: 90 fL (ref 79–97)
Monocytes Absolute: 0.5 10*3/uL (ref 0.1–0.9)
Monocytes: 9 %
Neutrophils Absolute: 3.3 10*3/uL (ref 1.4–7.0)
Neutrophils: 55 %
Platelets: 298 10*3/uL (ref 150–450)
RBC: 4.66 x10E6/uL (ref 4.14–5.80)
RDW: 13.4 % (ref 11.6–15.4)
WBC: 6 10*3/uL (ref 3.4–10.8)

## 2023-08-11 LAB — CMP14+EGFR
ALT: 21 IU/L (ref 0–44)
AST: 17 IU/L (ref 0–40)
Albumin: 4 g/dL (ref 3.8–4.8)
Alkaline Phosphatase: 46 IU/L (ref 44–121)
BUN/Creatinine Ratio: 16 (ref 10–24)
BUN: 14 mg/dL (ref 8–27)
Bilirubin Total: 0.6 mg/dL (ref 0.0–1.2)
CO2: 25 mmol/L (ref 20–29)
Calcium: 9.4 mg/dL (ref 8.6–10.2)
Chloride: 100 mmol/L (ref 96–106)
Creatinine, Ser: 0.87 mg/dL (ref 0.76–1.27)
Globulin, Total: 2.6 g/dL (ref 1.5–4.5)
Glucose: 139 mg/dL — ABNORMAL HIGH (ref 70–99)
Potassium: 4.7 mmol/L (ref 3.5–5.2)
Sodium: 138 mmol/L (ref 134–144)
Total Protein: 6.6 g/dL (ref 6.0–8.5)
eGFR: 91 mL/min/{1.73_m2} (ref >59)

## 2023-08-11 LAB — LIPID PANEL
Chol/HDL Ratio: 2.6 ratio (ref 0.0–5.0)
Cholesterol, Total: 81 mg/dL — ABNORMAL LOW (ref 100–199)
HDL: 31 mg/dL — ABNORMAL LOW (ref >39)
LDL Chol Calc (NIH): 34 mg/dL (ref 0–99)
Triglycerides: 78 mg/dL (ref 0–149)
VLDL Cholesterol Cal: 16 mg/dL (ref 5–40)

## 2023-08-11 LAB — BAYER DCA HB A1C WAIVED: HB A1C (BAYER DCA - WAIVED): 7.7 % — ABNORMAL HIGH (ref 4.8–5.6)

## 2023-08-11 MED ORDER — METFORMIN HCL 500 MG PO TABS: 1000.0000 mg | ORAL_TABLET | Freq: Two times a day (BID) | ORAL | 3 refills | Status: DC

## 2023-08-11 MED ORDER — ATORVASTATIN CALCIUM 40 MG PO TABS: 40.0000 mg | ORAL_TABLET | Freq: Every day | ORAL | 3 refills | Status: DC

## 2023-08-11 MED ORDER — DAPAGLIFLOZIN PROPANEDIOL 10 MG PO TABS: 10.0000 mg | ORAL_TABLET | Freq: Every day | ORAL | 3 refills | Status: DC

## 2023-08-11 MED ORDER — LISINOPRIL 5 MG PO TABS: 5.0000 mg | ORAL_TABLET | Freq: Every day | ORAL | 3 refills | Status: DC

## 2023-08-11 NOTE — Progress Notes (Signed)
Subjective:  Patient ID: Peter Mathis, male    DOB: [birth year]  Age: 72 y.o. MRN: [id number]  CC: Medical Management of Chronic Issues   HPI Findlay Munsey Aiello presents forFollow-up of diabetes. Patient checks blood sugar at home.   140 fasting and 160 postprandial Patient denies symptoms such as polyuria, polydipsia, excessive hunger, nausea No significant hypoglycemic spells noted. Medications reviewed. Pt reports taking them regularly without complication/adverse reaction being reported today.   in for follow-up of elevated cholesterol. Doing well without complaints on current medication. Denies side effects of statin including myalgia and arthralgia and nausea. Currently no chest pain, shortness of breath or other cardiovascular related symptoms noted.   History [place] has a past medical history of Chest discomfort (1989), Diverticulosis, DM type 2 (diabetes mellitus, type 2) (HCC), Heart murmur, History of kidney stones, and Hyperlipidemia.   He has a past surgical history that includes Tonsillectomy and adenoidectomy (01/1955); Ganglion cyst excision (Right); and Inguinal hernia repair (Left, 04/03/2020).   His family history includes Diabetes in his mother; Heart attack in his father.He reports that he has never smoked. He has never used smokeless tobacco. He reports that he does not drink alcohol and does not use drugs.  Current Outpatient Medications on File Prior to Visit  Medication Sig Dispense Refill   aspirin 81 MG EC tablet Take 81 mg by mouth daily.     Blood Glucose Monitoring Suppl (ONETOUCH VERIO) w/Device KIT 1 Stick by Does not apply route 2 (two) times daily. 1 kit 0   Cholecalciferol (VITAMIN D3) 5000 UNITS CAPS Take 5,000 Units by mouth daily.     fluticasone (FLONASE) 50 MCG/ACT nasal spray Place 2 sprays into both nostrils daily. 16 g 6   Glycerin-Hypromellose-PEG 400 (VISINE DRY EYE) 0.2-0.2-1 % SOLN Place 1-2 drops into both eyes 4 (four) times daily as  needed.     Krill Oil 1000 MG CAPS Take 1,000 mg by mouth daily.     Multiple Vitamin (MULTIVITAMIN WITH MINERALS) TABS tablet Take 1 tablet by mouth daily.     sildenafil (REVATIO) 20 MG tablet TAKE 2-5 TABLETS AS NEEDED PRIOR TO SEXUAL ACTIVITY 50 tablet 3   sitaGLIPtin (JANUVIA) 100 MG tablet Take 1 tablet (100 mg total) by mouth daily. 90 tablet 3   No current facility-administered medications on file prior to visit.    ROS Review of Systems  Constitutional:  Negative for fever.  Respiratory:  Negative for shortness of breath.   Cardiovascular:  Negative for chest pain.  Gastrointestinal:  Positive for diarrhea and vomiting (last week).  Musculoskeletal:  Negative for arthralgias.  Skin:  Negative for rash.    Objective:  BP (!) 126/56   Pulse 73   Temp 97.9 F (36.6 C)   Ht 5\' 11"  (1.803 m)   Wt 168 lb 3.2 oz (76.3 kg)   SpO2 98%   BMI 23.46 kg/m   BP Readings from Last 3 Encounters:  08/11/23 (!) 126/56  08/04/23 130/69  05/06/23 (!) 127/57    Wt Readings from Last 3 Encounters:  08/11/23 168 lb 3.2 oz (76.3 kg)  08/04/23 170 lb (77.1 kg)  05/06/23 168 lb 12.8 oz (76.6 kg)     Physical Exam Vitals reviewed.  Constitutional:      Appearance: He is well-developed.  HENT:     Head: Normocephalic and atraumatic.     Right Ear: External ear normal.     Left Ear: External ear normal.  Mouth/Throat:     Pharynx: No oropharyngeal exudate or posterior oropharyngeal erythema.  Eyes:     Pupils: Pupils are equal, round, and reactive to light.  Cardiovascular:     Rate and Rhythm: Normal rate and regular rhythm.     Heart sounds: No murmur heard. Pulmonary:     Effort: No respiratory distress.     Breath sounds: Normal breath sounds.  Musculoskeletal:     Cervical back: Normal range of motion and neck supple.  Neurological:     Mental Status: He is alert and oriented to person, place, and time.       Assessment & Plan:   [place] was seen today for  medical management of chronic issues.  Diagnoses and all orders for this visit:  Type 2 diabetes mellitus without complication, without long-term current use of insulin (HCC) -     Bayer DCA Hb A1c Waived -     CBC with Differential/Platelet -     CMP14+EGFR  Mixed hyperlipidemia -     Lipid panel  Other orders -     atorvastatin (LIPITOR) 40 MG tablet; Take 1 tablet (40 mg total) by mouth daily. -     lisinopril (ZESTRIL) 5 MG tablet; Take 1 tablet (5 mg total) by mouth daily. -     metFORMIN (GLUCOPHAGE) 500 MG tablet; Take 2 tablets (1,000 mg total) by mouth 2 (two) times daily with a meal. -     dapagliflozin propanediol (FARXIGA) 10 MG TABS tablet; Take 1 tablet (10 mg total) by mouth daily before breakfast.      I am having Josede R. Rennaker start on dapagliflozin propanediol. I am also having him maintain his aspirin EC, Vitamin D3, OneTouch Verio, Krill Oil, multivitamin with minerals, Visine Dry Eye, fluticasone, sildenafil, sitaGLIPtin, atorvastatin, lisinopril, and metFORMIN.  Meds ordered this encounter  Medications   atorvastatin (LIPITOR) 40 MG tablet    Sig: Take 1 tablet (40 mg total) by mouth daily.    Dispense:  90 tablet    Refill:  3   lisinopril (ZESTRIL) 5 MG tablet    Sig: Take 1 tablet (5 mg total) by mouth daily.    Dispense:  90 tablet    Refill:  3   metFORMIN (GLUCOPHAGE) 500 MG tablet    Sig: Take 2 tablets (1,000 mg total) by mouth 2 (two) times daily with a meal.    Dispense:  360 tablet    Refill:  3   dapagliflozin propanediol (FARXIGA) 10 MG TABS tablet    Sig: Take 1 tablet (10 mg total) by mouth daily before breakfast.    Dispense:  90 tablet    Refill:  3     Follow-up: Return in about 3 months (around 11/11/2023).  Mechele Claude, M.D.

## 2023-08-12 NOTE — Progress Notes (Signed)
Hello [place],  Your lab result is normal and/or stable.Some minor variations that are not significant are commonly marked abnormal, but do not represent any medical problem for you.  Best regards, Warren Stacks, M.D.

## 2023-10-12 ENCOUNTER — Encounter: Payer: Self-pay | Admitting: Family Medicine

## 2023-10-12 MED ORDER — GLIMEPIRIDE 4 MG PO TABS: 4.0000 mg | ORAL_TABLET | Freq: Every day | ORAL | 2 refills | Status: DC

## 2023-11-01 ENCOUNTER — Telehealth: Payer: Self-pay | Admitting: Family Medicine

## 2023-11-04 ENCOUNTER — Encounter: Payer: Self-pay | Admitting: Family Medicine

## 2023-11-10 ENCOUNTER — Ambulatory Visit: Payer: PPO

## 2023-11-10 VITALS — Ht 71.0 in | Wt 173.0 lb

## 2023-11-10 DIAGNOSIS — Z0000 Encounter for general adult medical examination without abnormal findings: Secondary | ICD-10-CM

## 2023-11-10 NOTE — Patient Instructions (Signed)
Peter Mathis , Thank you for taking time to come for your Medicare Wellness Visit. I appreciate your ongoing commitment to your health goals. Please review the following plan we discussed and let me know if I can assist you in the future.   Referrals/Orders/Follow-Ups/Clinician Recommendations: Aim for 30 minutes of exercise or brisk walking, 6-8 glasses of water, and 5 servings of fruits and vegetables each day.   This is a list of the screening recommended for you and due dates:  Health Maintenance  Topic Date Due   Complete foot exam   08/05/2023   Eye exam for diabetics  08/18/2023   COVID-19 Vaccine (4 - 2023-24 season) 08/29/2023   Flu Shot  03/27/2024*   Yearly kidney health urinalysis for diabetes  01/06/2024   Hemoglobin A1C  02/11/2024   Yearly kidney function blood test for diabetes  08/10/2024   Medicare Annual Wellness Visit  11/09/2024   DTaP/Tdap/Td vaccine (2 - Td or Tdap) 05/05/2026   Colon Cancer Screening  07/22/2026   Pneumonia Vaccine  Completed   Hepatitis C Screening  Completed   Zoster (Shingles) Vaccine  Completed   HPV Vaccine  Aged Out  *Topic was postponed. The date shown is not the original due date.    Advanced directives: (Copy Requested) Please bring a copy of your health care power of attorney and living will to the office to be added to your chart at your convenience.  Next Medicare Annual Wellness Visit scheduled for next year: Yes  insert Preventive Care attachment Insert FALL PREVENTION attachment if needed

## 2023-11-10 NOTE — Progress Notes (Signed)
Subjective:   Peter Mathis is a 72 y.o. male who presents for Medicare Annual/Subsequent preventive examination.  Visit Complete: Virtual I connected with  Peter Mathis on 11/10/23 by a audio enabled telemedicine application and verified that I am speaking with the correct person using two identifiers.  Patient Location: Home  Provider Location: Home Office  I discussed the limitations of evaluation and management by telemedicine. The patient expressed understanding and agreed to proceed.  Vital Signs: Because this visit was a virtual/telehealth visit, some criteria may be missing or patient reported. Any vitals not documented were not able to be obtained and vitals that have been documented are patient reported.  Patient Medicare AWV questionnaire was completed by the patient on 11/10/2023; I have confirmed that all information answered by patient is correct and no changes since this date.  Cardiac Risk Factors include: advanced age (>60men, >69 women);diabetes mellitus;dyslipidemia;male gender;hypertension     Objective:    Today's Vitals   11/10/23 0836  Weight: 173 lb (78.5 kg)  Height: 5\' 11"  (1.803 m)   Body mass index is 24.13 kg/m.     11/10/2023    8:41 AM 11/03/2022    3:36 PM 10/31/2021    2:11 PM 04/11/2020   10:09 AM 04/03/2020    9:44 AM 04/01/2020    2:25 PM 12/01/2017   10:37 AM  Advanced Directives  Does Patient Have a Medical Advance Directive? Yes No No No No No No  Type of Estate agent of [place];Living will        Does patient want to make changes to medical advance directive?   No - Patient declined      Copy of Healthcare Power of Attorney in Chart? No - copy requested        Would patient like information on creating a medical advance directive?  No - Patient declined No - Patient declined No - Patient declined No - Patient declined No - Patient declined Yes (ED - Information included in AVS)    Current Medications  (verified) Outpatient Encounter Medications as of 11/10/2023  Medication Sig   aspirin 81 MG EC tablet Take 81 mg by mouth daily.   atorvastatin (LIPITOR) 40 MG tablet Take 1 tablet (40 mg total) by mouth daily.   Blood Glucose Monitoring Suppl (ONETOUCH VERIO) w/Device KIT 1 Stick by Does not apply route 2 (two) times daily.   Cholecalciferol (VITAMIN D3) 5000 UNITS CAPS Take 5,000 Units by mouth daily.   dapagliflozin propanediol (FARXIGA) 10 MG TABS tablet Take 1 tablet (10 mg total) by mouth daily before breakfast.   fluticasone (FLONASE) 50 MCG/ACT nasal spray Place 2 sprays into both nostrils daily.   glimepiride (AMARYL) 4 MG tablet Take 1 tablet (4 mg total) by mouth daily before breakfast.   Glycerin-Hypromellose-PEG 400 (VISINE DRY EYE) 0.2-0.2-1 % SOLN Place 1-2 drops into both eyes 4 (four) times daily as needed.   Krill Oil 1000 MG CAPS Take 1,000 mg by mouth daily.   lisinopril (ZESTRIL) 5 MG tablet Take 1 tablet (5 mg total) by mouth daily.   metFORMIN (GLUCOPHAGE) 500 MG tablet Take 2 tablets (1,000 mg total) by mouth 2 (two) times daily with a meal.   Multiple Vitamin (MULTIVITAMIN WITH MINERALS) TABS tablet Take 1 tablet by mouth daily.   sildenafil (REVATIO) 20 MG tablet TAKE 2-5 TABLETS AS NEEDED PRIOR TO SEXUAL ACTIVITY   sitaGLIPtin (JANUVIA) 100 MG tablet Take 1 tablet (100 mg total) by mouth  daily.   No facility-administered encounter medications on file as of 11/10/2023.    Allergies (verified) Penicillins   History: Past Medical History:  Diagnosis Date   Chest discomfort 1989   Diverticulosis    DM type 2 (diabetes mellitus, type 2) (HCC)    Heart murmur    History of kidney stones    Hyperlipidemia    Past Surgical History:  Procedure Laterality Date   GANGLION CYST EXCISION Right    INGUINAL HERNIA REPAIR Left 04/03/2020   Procedure: HERNIA REPAIR INGUINAL ADULT WITH MESH;  Surgeon: Lucretia Roers, MD;  Location: [facility];  Service: General;   Laterality: Left;   TONSILLECTOMY AND ADENOIDECTOMY  01/1955   Family History  Problem Relation Age of Onset   Diabetes Mother    Heart attack Father    Colon cancer Neg Hx    Social History   Socioeconomic History   Marital status: Married    Spouse name: Not on file   Number of children: 3   Years of education: Not on file   Highest education level: Not on file  Occupational History   Occupation: retired  Tobacco Use   Smoking status: Never   Smokeless tobacco: Never  Vaping Use   Vaping status: Never Used  Substance and Sexual Activity   Alcohol use: No   Drug use: No   Sexual activity: Yes  Other Topics Concern   Not on file  Social History Narrative   Lives in one level home with basement.   Both parents live next door   Daughters and grandchildren visit often   Still very active and independent   Social Determinants of Health   Financial Resource Strain: Low Risk  (11/10/2023)   Overall Financial Resource Strain (CARDIA)    Difficulty of Paying Living Expenses: Not hard at all  Food Insecurity: No Food Insecurity (11/10/2023)   Hunger Vital Sign    Worried About Running Out of Food in the Last Year: Never true    Ran Out of Food in the Last Year: Never true  Transportation Needs: No Transportation Needs (11/10/2023)   PRAPARE - Administrator, Civil Service (Medical): No    Lack of Transportation (Non-Medical): No  Physical Activity: Insufficiently Active (11/10/2023)   Exercise Vital Sign    Days of Exercise per Week: 3 days    Minutes of Exercise per Session: 30 min  Stress: No Stress Concern Present (11/10/2023)   [employer] of Occupational Health - Occupational Stress Questionnaire    Feeling of Stress : Not at all  Social Connections: Socially Integrated (11/10/2023)   Social Connection and Isolation Panel [NHANES]    Frequency of Communication with Friends and Family: More than three times a week    Frequency of Social Gatherings  with Friends and Family: More than three times a week    Attends Religious Services: More than 4 times per year    Active Member of [employer] or Organizations: Yes    Attends Engineer, structural: More than 4 times per year    Marital Status: Married    Tobacco Counseling Counseling given: Not Answered   Clinical Intake:  Pre-visit preparation completed: Yes  Pain : No/denies pain     Nutritional Risks: None Diabetes: Yes CBG done?: No Did pt. bring in CBG monitor from home?: No  How often do you need to have someone help you when you read instructions, pamphlets, or other written materials from your doctor  or pharmacy?: 1 - Never  Interpreter Needed?: No  Information entered by :: Renie Ora, LPN   Activities of Daily Living    11/10/2023    8:41 AM  In your present state of health, do you have any difficulty performing the following activities:  Hearing? 0  Vision? 0  Difficulty concentrating or making decisions? 0  Walking or climbing stairs? 0  Dressing or bathing? 0  Doing errands, shopping? 0  Preparing Food and eating ? N  Using the Toilet? N  In the past six months, have you accidently leaked urine? N  Do you have problems with loss of bowel control? N  Managing your Medications? N  Managing your Finances? N  Housekeeping or managing your Housekeeping? N    Patient Care Team: Mechele Claude, MD as PCP - General (Family Medicine) Laurey Morale, MD as Consulting Physician (Cardiology) Randa Spike Kelton Pillar, LCSW as [facility] Care Management (Licensed Clinical Social Worker)  Indicate any recent Medical Services you may have received from other than [facility] providers in the past year (date may be approximate).     Assessment:   This is a routine wellness examination for Peter Mathis.  Hearing/Vision screen Vision Screening - Comments:: Wears rx glasses - up to date with routine eye exams with  [facility]    Goals Addressed              This Visit's Progress    DIET - INCREASE WATER INTAKE   On track    Increase water intake to 6-8 glasses per day.       Depression Screen    11/10/2023    8:40 AM 08/11/2023    7:47 AM 08/04/2023    8:42 AM 05/06/2023    7:54 AM 01/05/2023    1:14 PM 11/03/2022    3:35 PM 08/04/2022   12:59 PM  PHQ 2/9 Scores  PHQ - 2 Score 0 0 0 0 0 0 0  PHQ- 9 Score   0        Fall Risk    11/10/2023    8:38 AM 08/11/2023    8:03 AM 08/11/2023    7:46 AM 08/04/2023    8:42 AM 05/06/2023    7:54 AM  Fall Risk   Falls in the past year? 0 0 0 0 0  Number falls in past yr: 0      Injury with Fall? 0      Risk for fall due to : No Fall Risks      Follow up Falls prevention discussed        MEDICARE RISK AT HOME: Medicare Risk at Home Any stairs in or around the home?: Yes If so, are there any without handrails?: No Home free of loose throw rugs in walkways, pet beds, electrical cords, etc?: Yes Adequate lighting in your home to reduce risk of falls?: Yes Life alert?: No Use of a cane, walker or w/c?: No Grab bars in the bathroom?: Yes Shower chair or bench in shower?: Yes Elevated toilet seat or a handicapped toilet?: Yes  TIMED UP AND GO:  Was the test performed?  No    Cognitive Function:    12/01/2017    1:23 PM  MMSE - Mini Mental State Exam  Orientation to time 5  Orientation to Place 5  Registration 3  Attention/ Calculation 5  Recall 3  Language- name 2 objects 2  Language- repeat 1  Language- follow 3 step command  3  Language- read & follow direction 1  Write a sentence 1  Copy design 1  Total score 30        11/10/2023    8:41 AM 11/03/2022    3:40 PM 10/31/2021    2:23 PM  6CIT Screen  What Year? 0 points 0 points 0 points  What month? 0 points 0 points 0 points  What time? 0 points 0 points 0 points  Count back from 20 0 points 0 points 0 points  Months in reverse 0 points 0 points 0 points  Repeat phrase 0 points 0 points 0 points  Total Score 0  points 0 points 0 points    Immunizations Immunization History  Administered Date(s) Administered   Fluad Quad(high Dose 65+) 10/04/2019, 10/28/2020, 11/10/2021   Influenza, High Dose Seasonal PF 11/15/2015, 10/21/2016, 10/12/2017, 10/04/2018   Moderna Sars-Covid-2 Vaccination 02/02/2020, 03/02/2020, 11/28/2020   Pneumococcal Conjugate-13 05/02/2015   Pneumococcal Polysaccharide-23 07/26/2017   Tdap 05/05/2016   Zoster Recombinant(Shingrix) 09/07/2017, 01/25/2018    TDAP status: Up to date  Flu Vaccine status: Due, Education has been provided regarding the importance of this vaccine. Advised may receive this vaccine at local pharmacy or Health Dept. Aware to provide a copy of the vaccination record if obtained from local pharmacy or Health Dept. Verbalized acceptance and understanding.  Pneumococcal vaccine status: Up to date  Covid-19 vaccine status: Completed vaccines  Qualifies for Shingles Vaccine? Yes   Zostavax completed Yes   Shingrix Completed?: Yes  Screening Tests Health Maintenance  Topic Date Due   FOOT EXAM  08/05/2023   OPHTHALMOLOGY EXAM  08/18/2023   COVID-19 Vaccine (4 - 2023-24 season) 08/29/2023   INFLUENZA VACCINE  03/27/2024 (Originally 07/29/2023)   Diabetic kidney evaluation - Urine ACR  01/06/2024   HEMOGLOBIN A1C  02/11/2024   Diabetic kidney evaluation - eGFR measurement  08/10/2024   Medicare Annual Wellness (AWV)  11/09/2024   DTaP/Tdap/Td (2 - Td or Tdap) 05/05/2026   Colonoscopy  07/22/2026   Pneumonia Vaccine 6+ Years old  Completed   Hepatitis C Screening  Completed   Zoster Vaccines- Shingrix  Completed   HPV VACCINES  Aged Out    Health Maintenance  Health Maintenance Due  Topic Date Due   FOOT EXAM  08/05/2023   OPHTHALMOLOGY EXAM  08/18/2023   COVID-19 Vaccine (4 - 2023-24 season) 08/29/2023    Colorectal cancer screening: Type of screening: Colonoscopy. Completed 07/12/2016. Repeat every 10 years  Lung Cancer Screening: (Low  Dose CT Chest recommended if Age 69-80 years, 20 pack-year currently smoking OR have quit w/in 15years.) does not qualify.   Lung Cancer Screening Referral: n/a  Additional Screening:  Hepatitis C Screening: does not qualify; Completed 07/26/2017  Vision Screening: Recommended annual ophthalmology exams for early detection of glaucoma and other disorders of the eye. Is the patient up to date with their annual eye exam?  Yes  Who is the provider or what is the name of the office in which the patient attends annual eye exams? [facility]  If pt is not established with a provider, would they like to be referred to a provider to establish care? No .   Dental Screening: Recommended annual dental exams for proper oral hygiene  Diabetic Foot Exam: Diabetic Foot Exam: Overdue, Pt has been advised about the importance in completing this exam. Pt is scheduled for diabetic foot exam on next office visit .  Community Resource Referral / Chronic Care Management: CRR required  this visit?  No   CCM required this visit?  No     Plan:     I have personally reviewed and noted the following in the patient's chart:   Medical and social history Use of alcohol, tobacco or illicit drugs  Current medications and supplements including opioid prescriptions. Patient is not currently taking opioid prescriptions. Functional ability and status Nutritional status Physical activity Advanced directives List of other physicians Hospitalizations, surgeries, and ER visits in previous 12 months Vitals Screenings to include cognitive, depression, and falls Referrals and appointments  In addition, I have reviewed and discussed with patient certain preventive protocols, quality metrics, and best practice recommendations. A written personalized care plan for preventive services as well as general preventive health recommendations were provided to patient.     Lorrene Reid, LPN   [id number]   After Visit  Summary: (MyChart) Due to this being a telephonic visit, the after visit summary with patients personalized plan was offered to patient via MyChart   Nurse Notes: none

## 2023-11-16 ENCOUNTER — Ambulatory Visit: Payer: PPO | Admitting: Family Medicine

## 2023-11-16 ENCOUNTER — Encounter: Payer: Self-pay | Admitting: Family Medicine

## 2023-11-16 VITALS — BP 133/69 | HR 73 | Temp 98.1°F | Ht 71.0 in | Wt 173.0 lb

## 2023-11-16 DIAGNOSIS — Z23 Encounter for immunization: Secondary | ICD-10-CM | POA: Diagnosis not present

## 2023-11-16 DIAGNOSIS — E119 Type 2 diabetes mellitus without complications: Secondary | ICD-10-CM

## 2023-11-16 DIAGNOSIS — E782 Mixed hyperlipidemia: Secondary | ICD-10-CM

## 2023-11-16 DIAGNOSIS — E1159 Type 2 diabetes mellitus with other circulatory complications: Secondary | ICD-10-CM | POA: Diagnosis not present

## 2023-11-16 LAB — BAYER DCA HB A1C WAIVED: HB A1C (BAYER DCA - WAIVED): 7.2 % — ABNORMAL HIGH (ref 4.8–5.6)

## 2023-11-16 MED ORDER — DAPAGLIFLOZIN PROPANEDIOL 5 MG PO TABS: 5.0000 mg | ORAL_TABLET | Freq: Every day | ORAL | 1 refills | Status: DC

## 2023-11-16 MED ORDER — GLIMEPIRIDE 4 MG PO TABS: 4.0000 mg | ORAL_TABLET | Freq: Every day | ORAL | 3 refills | Status: DC

## 2023-11-16 MED ORDER — FEXOFENADINE-PSEUDOEPHED ER 180-240 MG PO TB24: 1.0000 | ORAL_TABLET | Freq: Every evening | ORAL | 11 refills | Status: AC

## 2023-11-16 MED ORDER — SILDENAFIL CITRATE 20 MG PO TABS: ORAL_TABLET | ORAL | 3 refills | Status: AC

## 2023-11-16 NOTE — Progress Notes (Signed)
Subjective:  Patient ID: Peter Mathis, male    DOB: 1951/03/09  Age: 72 y.o. MRN: [id number]  CC: Medical Management of Chronic Issues   HPI Peter Mathis presents forFollow-up of diabetes. Patient checks blood sugar at home.   70s fasting and 130s postprandial Patient denies symptoms such as polyuria, polydipsia, excessive hunger, nausea No significant hypoglycemic spells noted. Medications reviewed. Concerned about nausea and constipation from Farxiga Pt reports taking them regularly without complication/adverse reaction being reported today.    History Peter Mathis has a past medical history of Chest discomfort (1989), Diverticulosis, DM type 2 (diabetes mellitus, type 2) (HCC), Heart murmur, History of kidney stones, and Hyperlipidemia.   Peter Mathis has a past surgical history that includes Tonsillectomy and adenoidectomy (01/1955); Ganglion cyst excision (Right); and Inguinal hernia repair (Left, 04/03/2020).   His family history includes Diabetes in his mother; Heart attack in his father.Peter Mathis reports that Peter Mathis has never smoked. Peter Mathis has never used smokeless tobacco. Peter Mathis reports that Peter Mathis does not drink alcohol and does not use drugs.  Current Outpatient Medications on File Prior to Visit  Medication Sig Dispense Refill   aspirin 81 MG EC tablet Take 81 mg by mouth daily.     atorvastatin (LIPITOR) 40 MG tablet Take 1 tablet (40 mg total) by mouth daily. 90 tablet 3   Blood Glucose Monitoring Suppl (ONETOUCH VERIO) w/Device KIT 1 Stick by Does not apply route 2 (two) times daily. 1 kit 0   Cholecalciferol (VITAMIN D3) 5000 UNITS CAPS Take 5,000 Units by mouth daily.     dapagliflozin propanediol (FARXIGA) 10 MG TABS tablet Take 1 tablet (10 mg total) by mouth daily before breakfast. 90 tablet 3   fluticasone (FLONASE) 50 MCG/ACT nasal spray Place 2 sprays into both nostrils daily. 16 g 6   glimepiride (AMARYL) 4 MG tablet Take 1 tablet (4 mg total) by mouth daily before breakfast. 30 tablet 2    Glycerin-Hypromellose-PEG 400 (VISINE DRY EYE) 0.2-0.2-1 % SOLN Place 1-2 drops into both eyes 4 (four) times daily as needed.     Krill Oil 1000 MG CAPS Take 1,000 mg by mouth daily.     lisinopril (ZESTRIL) 5 MG tablet Take 1 tablet (5 mg total) by mouth daily. 90 tablet 3   metFORMIN (GLUCOPHAGE) 500 MG tablet Take 2 tablets (1,000 mg total) by mouth 2 (two) times daily with a meal. 360 tablet 3   Multiple Vitamin (MULTIVITAMIN WITH MINERALS) TABS tablet Take 1 tablet by mouth daily.     sildenafil (REVATIO) 20 MG tablet TAKE 2-5 TABLETS AS NEEDED PRIOR TO SEXUAL ACTIVITY 50 tablet 3   sitaGLIPtin (JANUVIA) 100 MG tablet Take 1 tablet (100 mg total) by mouth daily. 90 tablet 3   No current facility-administered medications on file prior to visit.    ROS Review of Systems  Constitutional:  Negative for fever.  Respiratory:  Negative for shortness of breath.   Cardiovascular:  Negative for chest pain.  Musculoskeletal:  Negative for arthralgias.  Skin:  Negative for rash.    Objective:  BP 133/69   Pulse 73   Temp 98.1 F (36.7 C)   Ht 5\' 11"  (1.803 m)   Wt 173 lb (78.5 kg)   SpO2 99%   BMI 24.13 kg/m   BP Readings from Last 3 Encounters:  11/16/23 133/69  08/11/23 (!) 126/56  08/04/23 130/69    Wt Readings from Last 3 Encounters:  11/16/23 173 lb (78.5 kg)  11/10/23 173 lb (78.5  kg)  08/11/23 168 lb 3.2 oz (76.3 kg)     Physical Exam Vitals reviewed.  Constitutional:      Appearance: Peter Mathis is well-developed.  HENT:     Head: Normocephalic and atraumatic.     Right Ear: External ear normal.     Left Ear: External ear normal.     Mouth/Throat:     Pharynx: No oropharyngeal exudate or posterior oropharyngeal erythema.  Eyes:     Pupils: Pupils are equal, round, and reactive to light.  Cardiovascular:     Rate and Rhythm: Normal rate and regular rhythm.     Heart sounds: No murmur heard. Pulmonary:     Effort: No respiratory distress.     Breath sounds: Normal  breath sounds.  Musculoskeletal:     Cervical back: Normal range of motion and neck supple.  Neurological:     Mental Status: Peter Mathis is alert and oriented to person, place, and time.     Diabetic Foot Exam - Simple   Simple Foot Form Diabetic Foot exam was performed with the following findings: Yes 11/16/2023 10:01 AM  Visual Inspection No deformities, no ulcerations, no other skin breakdown bilaterally: Yes Sensation Testing Intact to touch and monofilament testing bilaterally: Yes Pulse Check Posterior Tibialis and Dorsalis pulse intact bilaterally: Yes Comments      Assessment & Plan:   Peter Mathis was seen today for medical management of chronic issues.  Diagnoses and all orders for this visit:  Type 2 diabetes mellitus without complication, without long-term current use of insulin (HCC) -     Bayer DCA Hb A1c Waived -     CBC with Differential/Platelet -     CMP14+EGFR  Mixed hyperlipidemia -     Lipid panel  Need for influenza vaccination -     Flu Vaccine Trivalent High Dose (Fluad)      I am having Peter Mathis maintain his aspirin EC, Vitamin D3, OneTouch Verio, Krill Oil, multivitamin with minerals, Visine Dry Eye, fluticasone, sildenafil, sitaGLIPtin, atorvastatin, lisinopril, metFORMIN, dapagliflozin propanediol, and glimepiride.  No orders of the defined types were placed in this encounter.    Follow-up: No follow-ups on file.  Mechele Claude, M.D.

## 2023-11-17 ENCOUNTER — Encounter: Payer: Self-pay | Admitting: Family Medicine

## 2023-11-17 ENCOUNTER — Other Ambulatory Visit: Payer: Self-pay | Admitting: *Deleted

## 2023-11-17 DIAGNOSIS — E119 Type 2 diabetes mellitus without complications: Secondary | ICD-10-CM

## 2023-11-17 LAB — CBC WITH DIFFERENTIAL/PLATELET
Basophils Absolute: 0 10*3/uL (ref 0.0–0.2)
Basos: 0 %
EOS (ABSOLUTE): 0.2 10*3/uL (ref 0.0–0.4)
Eos: 1 %
Hematocrit: 46.7 % (ref 37.5–51.0)
Hemoglobin: 15.2 g/dL (ref 13.0–17.7)
Immature Grans (Abs): 0 10*3/uL (ref 0.0–0.1)
Immature Granulocytes: 0 %
Lymphocytes Absolute: 2.4 10*3/uL (ref 0.7–3.1)
Lymphs: 19 %
MCH: 29.2 pg (ref 26.6–33.0)
MCHC: 32.5 g/dL (ref 31.5–35.7)
MCV: 90 fL (ref 79–97)
Monocytes Absolute: 1 10*3/uL — ABNORMAL HIGH (ref 0.1–0.9)
Monocytes: 8 %
Neutrophils Absolute: 9.1 10*3/uL — ABNORMAL HIGH (ref 1.4–7.0)
Neutrophils: 72 %
Platelets: 319 10*3/uL (ref 150–450)
RBC: 5.21 x10E6/uL (ref 4.14–5.80)
RDW: 12.1 % (ref 11.6–15.4)
WBC: 12.8 10*3/uL — ABNORMAL HIGH (ref 3.4–10.8)

## 2023-11-17 LAB — CMP14+EGFR
ALT: 18 [IU]/L (ref 0–44)
AST: 15 IU/L (ref 0–40)
Albumin: 4.4 g/dL (ref 3.8–4.8)
Alkaline Phosphatase: 52 [IU]/L (ref 44–121)
BUN/Creatinine Ratio: 14 (ref 10–24)
BUN: 14 mg/dL (ref 8–27)
Bilirubin Total: 0.3 mg/dL (ref 0.0–1.2)
CO2: 25 mmol/L (ref 20–29)
Calcium: 9.7 mg/dL (ref 8.6–10.2)
Chloride: 103 mmol/L (ref 96–106)
Creatinine, Ser: 1.02 mg/dL (ref 0.76–1.27)
Globulin, Total: 2.8 g/dL (ref 1.5–4.5)
Glucose: 125 mg/dL — ABNORMAL HIGH (ref 70–99)
Potassium: 5.8 mmol/L — ABNORMAL HIGH (ref 3.5–5.2)
Sodium: 141 mmol/L (ref 134–144)
Total Protein: 7.2 g/dL (ref 6.0–8.5)
eGFR: 78 mL/min/{1.73_m2} (ref >59)

## 2023-11-17 LAB — LIPID PANEL
Chol/HDL Ratio: 2.6 ratio (ref 0.0–5.0)
Cholesterol, Total: 102 mg/dL (ref 100–199)
HDL: 40 mg/dL (ref >39)
LDL Chol Calc (NIH): 41 mg/dL (ref 0–99)
Triglycerides: 114 mg/dL (ref 0–149)
VLDL Cholesterol Cal: 21 mg/dL (ref 5–40)

## 2023-11-18 ENCOUNTER — Other Ambulatory Visit: Payer: Self-pay | Admitting: Family Medicine

## 2023-11-18 NOTE — Telephone Encounter (Signed)
Copied from CRM [id number]. Topic: Clinical - Medication Refill >> Nov 18, 2023  8:13 AM Fuller Mandril wrote: Most Recent Primary Care Visit:  Provider: Mechele Claude  Department: [facility]  Visit Type: OFFICE VISIT  Date: 11/16/2023  Medication: Marcelline Deist 5mg   Has the patient contacted their pharmacy? Yes (Agent: If no, request that the patient contact the pharmacy for the refill. If patient does not wish to contact the pharmacy document the reason why and proceed with request.) (Agent: If yes, when and what did the pharmacy advise?)  Is this the correct pharmacy for this prescription? Yes If no, delete pharmacy and type the correct one.  This is the patient's preferred pharmacy:  [id number] - [place], [place] - [address] [place] [address] Phone: [phone] Fax: [phone]    Has the prescription been filled recently? No  Is the patient out of the medication?   Has the patient been seen for an appointment in the last year OR does the patient have an upcoming appointment?   Can we respond through MyChart?   Agent: Please be advised that Rx refills may take up to 3 business days. We ask that you follow-up with your pharmacy.

## 2023-11-18 NOTE — Telephone Encounter (Signed)
DUPLICATE

## 2023-11-19 ENCOUNTER — Other Ambulatory Visit: Payer: Self-pay | Admitting: *Deleted

## 2023-11-19 MED ORDER — DAPAGLIFLOZIN PROPANEDIOL 5 MG PO TABS: 5.0000 mg | ORAL_TABLET | Freq: Every day | ORAL | 1 refills | Status: DC

## 2023-11-19 NOTE — Telephone Encounter (Signed)
Fax from [facility] [place] [facility] for Farxiga sent 11/16/23 was deleted from there system, resent.

## 2023-11-21 ENCOUNTER — Encounter: Payer: Self-pay | Admitting: Family Medicine

## 2023-11-23 ENCOUNTER — Encounter: Payer: Self-pay | Admitting: Family Medicine

## 2023-11-23 ENCOUNTER — Ambulatory Visit (INDEPENDENT_AMBULATORY_CARE_PROVIDER_SITE_OTHER): Payer: PPO | Admitting: Family Medicine

## 2023-11-23 VITALS — BP 144/74 | HR 69 | Temp 97.9°F | Ht 71.0 in | Wt 171.6 lb

## 2023-11-23 DIAGNOSIS — J0100 Acute maxillary sinusitis, unspecified: Secondary | ICD-10-CM | POA: Diagnosis not present

## 2023-11-23 DIAGNOSIS — E875 Hyperkalemia: Secondary | ICD-10-CM | POA: Diagnosis not present

## 2023-11-23 MED ORDER — MOXIFLOXACIN HCL 400 MG PO TABS: 400.0000 mg | ORAL_TABLET | Freq: Every day | ORAL | 0 refills | Status: DC

## 2023-11-23 MED ORDER — BETAMETHASONE SOD PHOS & ACET 6 (3-3) MG/ML IJ SUSP
6.0000 mg | Freq: Once | INTRAMUSCULAR | Status: AC
  Administered 2023-11-23: 6 mg via INTRAMUSCULAR

## 2023-11-23 NOTE — Progress Notes (Addendum)
Chief Complaint  Patient presents with   Cough   Nasal Congestion   Headache    HPI  Patient presents today for Patient presents with upper respiratory congestion. Rhinorrhea that is frequently purulent. There is moderate sore throat. Patient reports coughing frequently as well.  Yellow & green sputum noted. There is no fever, chills or sweats. The patient denies being short of breath. Onset was 3-5 days ago. Gradually worsening. Tried OTCs without improvement.  PMH: Smoking status noted ROS: Per HPI  Objective: BP (!) 144/74   Pulse 69   Temp 97.9 F (36.6 C)   Ht 5\' 11"  (1.803 m)   Wt 171 lb 9.6 oz (77.8 kg)   SpO2 99%   BMI 23.93 kg/m  Gen: NAD, alert, cooperative with exam HEENT: NCAT, Nasal passages swollen, red TMS RED CV: RRR, good S1/S2, no murmur Resp: Bronchitis changes with scattered wheezes, non-labored Ext: No edema, warm Neuro: Alert and oriented, No gross deficits  Assessment and plan:  1. Acute maxillary sinusitis, recurrence not specified   2. Hyperkalemia     Meds ordered this encounter  Medications   moxifloxacin (AVELOX) 400 MG tablet    Sig: Take 1 tablet (400 mg total) by mouth daily.    Dispense:  10 tablet    Refill:  0   betamethasone acetate-betamethasone sodium phosphate (CELESTONE) injection 6 mg    Orders Placed This Encounter  Procedures   BMP8+EGFR    Follow up as needed.  Mechele Claude, MD

## 2023-11-23 NOTE — Addendum Note (Signed)
Addended by: Mechele Claude on: 11/23/2023 03:07 PM   Modules accepted: Orders

## 2023-11-24 ENCOUNTER — Other Ambulatory Visit: Payer: PPO

## 2023-11-24 LAB — BMP8+EGFR
BUN/Creatinine Ratio: 17 (ref 10–24)
BUN: 17 mg/dL (ref 8–27)
CO2: 21 mmol/L (ref 20–29)
Calcium: 9.4 mg/dL (ref 8.6–10.2)
Chloride: 99 mmol/L (ref 96–106)
Creatinine, Ser: 1 mg/dL (ref 0.76–1.27)
Glucose: 118 mg/dL — ABNORMAL HIGH (ref 70–99)
Potassium: 4.6 mmol/L (ref 3.5–5.2)
Sodium: 141 mmol/L (ref 134–144)
eGFR: 79 mL/min/{1.73_m2} (ref >59)

## 2023-11-28 NOTE — Progress Notes (Signed)
Hello [place],  Your lab result is normal and/or stable.Some minor variations that are not significant are commonly marked abnormal, but do not represent any medical problem for you.  Best regards, Jaice Digioia, M.D.

## 2023-12-10 ENCOUNTER — Encounter: Payer: Self-pay | Admitting: Family Medicine

## 2023-12-10 NOTE — Telephone Encounter (Signed)
 Care team updated and letter sent for eye exam notes.

## 2023-12-27 ENCOUNTER — Encounter: Payer: Self-pay | Admitting: Family Medicine

## 2024-01-01 DIAGNOSIS — U071 COVID-19: Secondary | ICD-10-CM | POA: Diagnosis not present

## 2024-01-01 DIAGNOSIS — R051 Acute cough: Secondary | ICD-10-CM | POA: Diagnosis not present

## 2024-01-01 DIAGNOSIS — Z20822 Contact with and (suspected) exposure to covid-19: Secondary | ICD-10-CM | POA: Diagnosis not present

## 2024-01-01 DIAGNOSIS — R070 Pain in throat: Secondary | ICD-10-CM | POA: Diagnosis not present

## 2024-01-24 ENCOUNTER — Other Ambulatory Visit: Payer: Self-pay | Admitting: Family Medicine

## 2024-02-17 ENCOUNTER — Ambulatory Visit: Payer: PPO | Admitting: Family Medicine

## 2024-02-27 ENCOUNTER — Other Ambulatory Visit: Payer: Self-pay | Admitting: Family Medicine

## 2024-03-16 ENCOUNTER — Ambulatory Visit (INDEPENDENT_AMBULATORY_CARE_PROVIDER_SITE_OTHER): Payer: PPO | Admitting: Family Medicine

## 2024-03-16 ENCOUNTER — Encounter: Payer: Self-pay | Admitting: Family Medicine

## 2024-03-16 VITALS — BP 129/62 | HR 54 | Temp 97.5°F | Ht 71.0 in | Wt 172.0 lb

## 2024-03-16 DIAGNOSIS — Z7984 Long term (current) use of oral hypoglycemic drugs: Secondary | ICD-10-CM | POA: Diagnosis not present

## 2024-03-16 DIAGNOSIS — E119 Type 2 diabetes mellitus without complications: Secondary | ICD-10-CM | POA: Diagnosis not present

## 2024-03-16 DIAGNOSIS — E782 Mixed hyperlipidemia: Secondary | ICD-10-CM

## 2024-03-16 DIAGNOSIS — E875 Hyperkalemia: Secondary | ICD-10-CM | POA: Diagnosis not present

## 2024-03-16 LAB — CBC WITH DIFFERENTIAL/PLATELET
Basophils Absolute: 0 10*3/uL (ref 0.0–0.2)
Basos: 1 %
EOS (ABSOLUTE): 0.1 10*3/uL (ref 0.0–0.4)
Eos: 2 %
Hematocrit: 46.6 % (ref 37.5–51.0)
Hemoglobin: 14.8 g/dL (ref 13.0–17.7)
Immature Grans (Abs): 0 10*3/uL (ref 0.0–0.1)
Immature Granulocytes: 0 %
Lymphocytes Absolute: 2.2 10*3/uL (ref 0.7–3.1)
Lymphs: 30 %
MCH: 27.9 pg (ref 26.6–33.0)
MCHC: 31.8 g/dL (ref 31.5–35.7)
MCV: 88 fL (ref 79–97)
Monocytes Absolute: 0.6 10*3/uL (ref 0.1–0.9)
Monocytes: 9 %
Neutrophils Absolute: 4.3 10*3/uL (ref 1.4–7.0)
Neutrophils: 58 %
Platelets: 284 10*3/uL (ref 150–450)
RBC: 5.3 x10E6/uL (ref 4.14–5.80)
RDW: 13.1 % (ref 11.6–15.4)
WBC: 7.3 10*3/uL (ref 3.4–10.8)

## 2024-03-16 LAB — LIPID PANEL
Chol/HDL Ratio: 2.6 ratio (ref 0.0–5.0)
Cholesterol, Total: 92 mg/dL — ABNORMAL LOW (ref 100–199)
HDL: 36 mg/dL — ABNORMAL LOW (ref >39)
LDL Chol Calc (NIH): 41 mg/dL (ref 0–99)
Triglycerides: 69 mg/dL (ref 0–149)
VLDL Cholesterol Cal: 15 mg/dL (ref 5–40)

## 2024-03-16 LAB — CMP14+EGFR
ALT: 22 IU/L (ref 0–44)
AST: 17 IU/L (ref 0–40)
Albumin: 4.3 g/dL (ref 3.8–4.8)
Alkaline Phosphatase: 51 IU/L (ref 44–121)
BUN/Creatinine Ratio: 15 (ref 10–24)
BUN: 16 mg/dL (ref 8–27)
Bilirubin Total: 0.5 mg/dL (ref 0.0–1.2)
CO2: 23 mmol/L (ref 20–29)
Calcium: 9.3 mg/dL (ref 8.6–10.2)
Chloride: 101 mmol/L (ref 96–106)
Creatinine, Ser: 1.06 mg/dL (ref 0.76–1.27)
Globulin, Total: 2.5 g/dL (ref 1.5–4.5)
Glucose: 127 mg/dL — ABNORMAL HIGH (ref 70–99)
Potassium: 5.2 mmol/L (ref 3.5–5.2)
Sodium: 140 mmol/L (ref 134–144)
Total Protein: 6.8 g/dL (ref 6.0–8.5)
eGFR: 74 mL/min/{1.73_m2} (ref >59)

## 2024-03-16 LAB — BAYER DCA HB A1C WAIVED: HB A1C (BAYER DCA - WAIVED): 7.4 % — ABNORMAL HIGH (ref 4.8–5.6)

## 2024-03-16 MED ORDER — DAPAGLIFLOZIN PROPANEDIOL 10 MG PO TABS: 10.0000 mg | ORAL_TABLET | Freq: Every day | ORAL | 3 refills | Status: DC

## 2024-03-16 NOTE — Progress Notes (Signed)
 Subjective:  Patient ID: Peter Mathis,  male    DOB: 1951-09-09  Age: 73 y.o.    CC: Medical Management of Chronic Issues (No concerns )   HPI Tyronne R Novosel presents for  follow-up of hypertension. Patient has no history of headache chest pain or shortness of breath or recent cough. Patient also denies symptoms of TIA such as numbness weakness lateralizing. Patient denies side effects from medication. States taking it regularly.  Patient also  in for follow-up of elevated cholesterol. Doing well without complaints on current medication. Denies side effects  including myalgia and arthralgia and nausea. Also in today for liver function testing. Currently no chest pain, shortness of breath or other cardiovascular related symptoms noted.  Follow-up of diabetes. Patient does check blood sugar at home. Readings run between 128 and 139 fasting. Patient denies symptoms such as excessive hunger or urinary frequency, excessive hunger, nausea No significant hypoglycemic spells noted. Medications reviewed. Pt reports taking them regularly. Pt. denies complication/adverse reaction today.    History Tania has a past medical history of Chest discomfort (1989), Diverticulosis, DM type 2 (diabetes mellitus, type 2) (HCC), Heart murmur, History of kidney stones, and Hyperlipidemia.   He has a past surgical history that includes Tonsillectomy and adenoidectomy (01/1955); Ganglion cyst excision (Right); and Inguinal hernia repair (Left, 04/03/2020).   His family history includes Diabetes in his mother; Heart attack in his father.He reports that he has never smoked. He has never used smokeless tobacco. He reports that he does not drink alcohol and does not use drugs.  Current Outpatient Medications on File Prior to Visit  Medication Sig Dispense Refill   aspirin 81 MG EC tablet Take 81 mg by mouth daily.     atorvastatin (LIPITOR) 40 MG tablet Take 1 tablet (40 mg total) by mouth daily. 90 tablet 3    Blood Glucose Monitoring Suppl (ONETOUCH VERIO) w/Device KIT 1 Stick by Does not apply route 2 (two) times daily. 1 kit 0   Cholecalciferol (VITAMIN D3) 5000 UNITS CAPS Take 5,000 Units by mouth daily.     fexofenadine-pseudoephedrine (ALLEGRA-D 24) 180-240 MG 24 hr tablet Take 1 tablet by mouth every evening. For allergy and congestion 30 tablet 11   fluticasone (FLONASE) 50 MCG/ACT nasal spray Place 2 sprays into both nostrils daily. 16 g 6   glimepiride (AMARYL) 4 MG tablet Take 1 tablet (4 mg total) by mouth daily before breakfast. 90 tablet 3   Glycerin-Hypromellose-PEG 400 (VISINE DRY EYE) 0.2-0.2-1 % SOLN Place 1-2 drops into both eyes 4 (four) times daily as needed.     Krill Oil 1000 MG CAPS Take 1,000 mg by mouth daily.     lisinopril (ZESTRIL) 5 MG tablet Take 1 tablet (5 mg total) by mouth daily. 90 tablet 3   metFORMIN (GLUCOPHAGE) 500 MG tablet Take 2 tablets (1,000 mg total) by mouth 2 (two) times daily with a meal. 360 tablet 3   Multiple Vitamin (MULTIVITAMIN WITH MINERALS) TABS tablet Take 1 tablet by mouth daily.     sildenafil (REVATIO) 20 MG tablet TAKE 2-5 TABLETS AS NEEDED PRIOR TO SEXUAL ACTIVITY 50 tablet 3   No current facility-administered medications on file prior to visit.    ROS Review of Systems  Constitutional:  Negative for fever.  Respiratory:  Negative for shortness of breath.   Cardiovascular:  Negative for chest pain.  Musculoskeletal:  Negative for arthralgias.  Skin:  Negative for rash.    Objective:  BP 129/62  Pulse (!) 54   Temp (!) 97.5 F (36.4 C)   Ht 5\' 11"  (1.803 m)   Wt 172 lb (78 kg)   SpO2 99%   BMI 23.99 kg/m   BP Readings from Last 3 Encounters:  03/16/24 129/62  11/23/23 (!) 144/74  11/16/23 133/69    Wt Readings from Last 3 Encounters:  03/16/24 172 lb (78 kg)  11/23/23 171 lb 9.6 oz (77.8 kg)  11/16/23 173 lb (78.5 kg)    Lab Results  Component Value Date   HGBA1C 7.4 (H) 03/16/2024   HGBA1C 7.2 (H)  11/16/2023   HGBA1C 7.7 (H) 08/11/2023    Physical Exam Vitals reviewed.  Constitutional:      Appearance: He is well-developed.  HENT:     Head: Normocephalic and atraumatic.     Right Ear: External ear normal.     Left Ear: External ear normal.     Mouth/Throat:     Pharynx: No oropharyngeal exudate or posterior oropharyngeal erythema.  Eyes:     Pupils: Pupils are equal, round, and reactive to light.  Cardiovascular:     Rate and Rhythm: Normal rate and regular rhythm.     Heart sounds: No murmur heard. Pulmonary:     Effort: No respiratory distress.     Breath sounds: Normal breath sounds.  Musculoskeletal:     Cervical back: Normal range of motion and neck supple.  Neurological:     Mental Status: He is alert and oriented to person, place, and time.         Assessment & Plan:  Type 2 diabetes mellitus without complication, without long-term current use of insulin (HCC) -     Bayer DCA Hb A1c Waived  Hyperkalemia -     CBC with Differential/Platelet -     CMP14+EGFR  Mixed hyperlipidemia -     Lipid panel  Other orders -     Dapagliflozin Propanediol; Take 1 tablet (10 mg total) by mouth daily before breakfast.  Dispense: 90 tablet; Refill: 3    Follow-up: Return in about 3 months (around 06/16/2024).  Mechele Claude, M.D.

## 2024-03-20 ENCOUNTER — Encounter: Payer: Self-pay | Admitting: Family Medicine

## 2024-03-20 NOTE — Progress Notes (Signed)
Hello [place],  Your lab result is normal and/or stable.Some minor variations that are not significant are commonly marked abnormal, but do not represent any medical problem for you.  Best regards, Jaice Digioia, M.D.

## 2024-06-19 ENCOUNTER — Encounter: Payer: Self-pay | Admitting: Family Medicine

## 2024-06-19 ENCOUNTER — Ambulatory Visit (INDEPENDENT_AMBULATORY_CARE_PROVIDER_SITE_OTHER): Admitting: Family Medicine

## 2024-06-19 VITALS — BP 142/69 | HR 74 | Temp 97.5°F | Ht 71.0 in | Wt 170.0 lb

## 2024-06-19 DIAGNOSIS — E875 Hyperkalemia: Secondary | ICD-10-CM

## 2024-06-19 DIAGNOSIS — E782 Mixed hyperlipidemia: Secondary | ICD-10-CM | POA: Diagnosis not present

## 2024-06-19 DIAGNOSIS — M722 Plantar fascial fibromatosis: Secondary | ICD-10-CM

## 2024-06-19 DIAGNOSIS — Z7984 Long term (current) use of oral hypoglycemic drugs: Secondary | ICD-10-CM

## 2024-06-19 DIAGNOSIS — E119 Type 2 diabetes mellitus without complications: Secondary | ICD-10-CM | POA: Diagnosis not present

## 2024-06-19 LAB — LIPID PANEL

## 2024-06-19 LAB — BAYER DCA HB A1C WAIVED: HB A1C (BAYER DCA - WAIVED): 7.8 % — ABNORMAL HIGH (ref 4.8–5.6)

## 2024-06-19 MED ORDER — ATORVASTATIN CALCIUM 40 MG PO TABS: 40.0000 mg | ORAL_TABLET | Freq: Every day | ORAL | 3 refills | Status: AC

## 2024-06-19 MED ORDER — DAPAGLIFLOZIN PROPANEDIOL 10 MG PO TABS: 10.0000 mg | ORAL_TABLET | Freq: Every day | ORAL | 3 refills | Status: AC

## 2024-06-19 MED ORDER — DICLOFENAC SODIUM 75 MG PO TBEC: 75.0000 mg | DELAYED_RELEASE_TABLET | Freq: Two times a day (BID) | ORAL | 2 refills | Status: AC

## 2024-06-19 MED ORDER — LISINOPRIL 5 MG PO TABS: 5.0000 mg | ORAL_TABLET | Freq: Every day | ORAL | 3 refills | Status: AC

## 2024-06-19 MED ORDER — METFORMIN HCL 500 MG PO TABS: 1000.0000 mg | ORAL_TABLET | Freq: Two times a day (BID) | ORAL | 3 refills | Status: AC

## 2024-06-19 NOTE — Progress Notes (Signed)
 Subjective:  Patient ID: Peter Mathis,  male    DOB: 16-Nov-1951  Age: 73 y.o.    CC: Medical Management of Chronic Issues and Foot Injury (Right heel pain. Off and on for awhile but consistent for the past week. Only hurts when standing or walking. )   HPI Marino Rogerson Dafoe presents for  follow-up of hypertension. Patient has no history of headache chest pain or shortness of breath or recent cough. Patient also denies symptoms of TIA such as numbness weakness lateralizing. Patient denies side effects from medication. States taking it regularly.  Patient also  in for follow-up of elevated cholesterol. Doing well without complaints on current medication. Denies side effects  including myalgia and arthralgia and nausea. Also in today for liver function testing. Currently no chest pain, shortness of breath or other cardiovascular related symptoms noted.  Follow-up of diabetes. Patient does check blood sugar at home. Readings run between 110 and 140 Patient denies symptoms such as excessive hunger or urinary frequency, excessive hunger, nausea No significant hypoglycemic spells noted. Medications reviewed. Pt reports taking them regularly. Pt. denies complication/adverse reaction today.    History Racer has a past medical history of Chest discomfort (1989), Diverticulosis, DM type 2 (diabetes mellitus, type 2) (HCC), Heart murmur, History of kidney stones, and Hyperlipidemia.   He has a past surgical history that includes Tonsillectomy and adenoidectomy (01/1955); Ganglion cyst excision (Right); and Inguinal hernia repair (Left, 04/03/2020).   His family history includes Diabetes in his mother; Heart attack in his father.He reports that he has never smoked. He has never used smokeless tobacco. He reports that he does not drink alcohol and does not use drugs.  Current Outpatient Medications on File Prior to Visit  Medication Sig Dispense Refill   aspirin 81 MG EC tablet Take 81 mg by mouth  daily.     Blood Glucose Monitoring Suppl (ONETOUCH VERIO) w/Device KIT 1 Stick by Does not apply route 2 (two) times daily. 1 kit 0   Cholecalciferol (VITAMIN D3) 5000 UNITS CAPS Take 5,000 Units by mouth daily.     fexofenadine -pseudoephedrine (ALLEGRA-D 24) 180-240 MG 24 hr tablet Take 1 tablet by mouth every evening. For allergy and congestion 30 tablet 11   fluticasone  (FLONASE ) 50 MCG/ACT nasal spray Place 2 sprays into both nostrils daily. 16 g 6   glimepiride  (AMARYL ) 4 MG tablet Take 1 tablet (4 mg total) by mouth daily before breakfast. 90 tablet 3   Glycerin-Hypromellose-PEG 400 (VISINE DRY EYE) 0.2-0.2-1 % SOLN Place 1-2 drops into both eyes 4 (four) times daily as needed.     Krill Oil 1000 MG CAPS Take 1,000 mg by mouth daily.     Multiple Vitamin (MULTIVITAMIN WITH MINERALS) TABS tablet Take 1 tablet by mouth daily.     sildenafil  (REVATIO ) 20 MG tablet TAKE 2-5 TABLETS AS NEEDED PRIOR TO SEXUAL ACTIVITY 50 tablet 3   No current facility-administered medications on file prior to visit.    ROS Review of Systems  Constitutional:  Negative for fever.  Respiratory:  Negative for shortness of breath.   Cardiovascular:  Negative for chest pain.  Musculoskeletal:  Negative for arthralgias.  Skin:  Negative for rash.    Objective:  BP (!) 142/69   Pulse 74   Temp (!) 97.5 F (36.4 C)   Ht 5' 11 (1.803 m)   Wt 170 lb (77.1 kg)   SpO2 97%   BMI 23.71 kg/m   BP Readings from Last 3 Encounters:  06/19/24 (!) 142/69  03/16/24 129/62  11/23/23 (!) 144/74    Wt Readings from Last 3 Encounters:  06/19/24 170 lb (77.1 kg)  03/16/24 172 lb (78 kg)  11/23/23 171 lb 9.6 oz (77.8 kg)    Lab Results  Component Value Date   HGBA1C 7.8 (H) 06/19/2024   HGBA1C 7.4 (H) 03/16/2024   HGBA1C 7.2 (H) 11/16/2023    Physical Exam Vitals reviewed.  Constitutional:      Appearance: He is well-developed.  HENT:     Head: Normocephalic and atraumatic.     Right Ear: External  ear normal.     Left Ear: External ear normal.     Mouth/Throat:     Pharynx: No oropharyngeal exudate or posterior oropharyngeal erythema.   Eyes:     Pupils: Pupils are equal, round, and reactive to light.    Cardiovascular:     Rate and Rhythm: Normal rate and regular rhythm.     Heart sounds: No murmur heard. Pulmonary:     Effort: No respiratory distress.     Breath sounds: Normal breath sounds.   Musculoskeletal:     Cervical back: Normal range of motion and neck supple.   Neurological:     Mental Status: He is alert and oriented to person, place, and time.         Assessment & Plan:  Type 2 diabetes mellitus without complication, without long-term current use of insulin (HCC) -     Microalbumin / creatinine urine ratio -     Bayer DCA Hb A1c Waived -     CMP14+EGFR -     Lisinopril ; Take 1 tablet (5 mg total) by mouth daily.  Dispense: 90 tablet; Refill: 3 -     metFORMIN  HCl; Take 2 tablets (1,000 mg total) by mouth 2 (two) times daily with a meal.  Dispense: 360 tablet; Refill: 3 -     Dapagliflozin  Propanediol; Take 1 tablet (10 mg total) by mouth daily before breakfast.  Dispense: 90 tablet; Refill: 3  Mixed hyperlipidemia -     CMP14+EGFR -     Lipid panel -     Atorvastatin  Calcium ; Take 1 tablet (40 mg total) by mouth daily.  Dispense: 90 tablet; Refill: 3  Hyperkalemia -     CMP14+EGFR  Plantar fasciitis of right foot -     CMP14+EGFR -     Diclofenac Sodium; Take 1 tablet (75 mg total) by mouth 2 (two) times daily. For muscle and  Joint pain  Dispense: 60 tablet; Refill: 2    Follow-up: Return in about 3 months (around 09/19/2024).  Butler Der, M.D.

## 2024-06-20 ENCOUNTER — Ambulatory Visit: Payer: Self-pay | Admitting: Family Medicine

## 2024-06-20 ENCOUNTER — Other Ambulatory Visit: Payer: Self-pay

## 2024-06-20 ENCOUNTER — Encounter: Payer: Self-pay | Admitting: Family Medicine

## 2024-06-20 LAB — MICROALBUMIN / CREATININE URINE RATIO
Creatinine, Urine: 72.1 mg/dL
Microalb/Creat Ratio: 5 mg/g{creat} (ref 0–29)
Microalbumin, Urine: 3.4 ug/mL

## 2024-06-20 LAB — CMP14+EGFR
ALT: 21 IU/L (ref 0–44)
AST: 17 IU/L (ref 0–40)
Albumin: 4.4 g/dL (ref 3.8–4.8)
Alkaline Phosphatase: 55 IU/L (ref 44–121)
BUN/Creatinine Ratio: 20 (ref 10–24)
BUN: 19 mg/dL (ref 8–27)
Bilirubin Total: 0.5 mg/dL (ref 0.0–1.2)
CO2: 21 mmol/L (ref 20–29)
Calcium: 10.1 mg/dL (ref 8.6–10.2)
Chloride: 99 mmol/L (ref 96–106)
Creatinine, Ser: 0.95 mg/dL (ref 0.76–1.27)
Globulin, Total: 2.6 g/dL (ref 1.5–4.5)
Glucose: 126 mg/dL — ABNORMAL HIGH (ref 70–99)
Potassium: 5.5 mmol/L — ABNORMAL HIGH (ref 3.5–5.2)
Sodium: 137 mmol/L (ref 134–144)
Total Protein: 7 g/dL (ref 6.0–8.5)
eGFR: 84 mL/min/{1.73_m2} (ref >59)

## 2024-06-20 LAB — LIPID PANEL
Cholesterol, Total: 106 mg/dL (ref 100–199)
HDL: 39 mg/dL — ABNORMAL LOW (ref >39)
LDL CALC COMMENT:: 2.7 ratio (ref 0.0–5.0)
LDL Chol Calc (NIH): 45 mg/dL (ref 0–99)
Triglycerides: 122 mg/dL (ref 0–149)
VLDL Cholesterol Cal: 22 mg/dL (ref 5–40)

## 2024-06-20 NOTE — Progress Notes (Signed)
Hello [place],  Your lab result is normal and/or stable.Some minor variations that are not significant are commonly marked abnormal, but do not represent any medical problem for you.  Best regards, Jaice Digioia, M.D.

## 2024-07-08 ENCOUNTER — Other Ambulatory Visit: Payer: Self-pay | Admitting: Family Medicine

## 2024-07-08 MED ORDER — ONETOUCH ULTRA BLUE TEST VI STRP: ORAL_STRIP | 12 refills | Status: DC

## 2024-07-19 ENCOUNTER — Other Ambulatory Visit: Payer: Self-pay | Admitting: Family Medicine

## 2024-07-19 MED ORDER — BLOOD GLUCOSE TEST VI STRP: 1.0000 | ORAL_STRIP | Freq: Three times a day (TID) | 11 refills | Status: AC

## 2024-07-19 MED ORDER — LANCETS MISC. MISC: 11 refills | Status: AC

## 2024-08-23 DIAGNOSIS — H2513 Age-related nuclear cataract, bilateral: Secondary | ICD-10-CM | POA: Diagnosis not present

## 2024-08-23 DIAGNOSIS — H43811 Vitreous degeneration, right eye: Secondary | ICD-10-CM | POA: Diagnosis not present

## 2024-08-23 DIAGNOSIS — E119 Type 2 diabetes mellitus without complications: Secondary | ICD-10-CM | POA: Diagnosis not present

## 2024-08-23 DIAGNOSIS — H43822 Vitreomacular adhesion, left eye: Secondary | ICD-10-CM | POA: Diagnosis not present

## 2024-08-23 LAB — HM DIABETES EYE EXAM

## 2024-09-25 ENCOUNTER — Encounter: Payer: Self-pay | Admitting: Family Medicine

## 2024-09-25 ENCOUNTER — Ambulatory Visit: Admitting: Family Medicine

## 2024-09-25 ENCOUNTER — Ambulatory Visit: Payer: Self-pay | Admitting: Family Medicine

## 2024-09-25 VITALS — BP 134/56 | HR 70 | Temp 98.2°F | Ht 71.0 in | Wt 170.6 lb

## 2024-09-25 DIAGNOSIS — E119 Type 2 diabetes mellitus without complications: Secondary | ICD-10-CM

## 2024-09-25 DIAGNOSIS — Z23 Encounter for immunization: Secondary | ICD-10-CM | POA: Diagnosis not present

## 2024-09-25 DIAGNOSIS — J301 Allergic rhinitis due to pollen: Secondary | ICD-10-CM | POA: Diagnosis not present

## 2024-09-25 DIAGNOSIS — Z7984 Long term (current) use of oral hypoglycemic drugs: Secondary | ICD-10-CM

## 2024-09-25 DIAGNOSIS — E782 Mixed hyperlipidemia: Secondary | ICD-10-CM

## 2024-09-25 DIAGNOSIS — Z125 Encounter for screening for malignant neoplasm of prostate: Secondary | ICD-10-CM

## 2024-09-25 LAB — BAYER DCA HB A1C WAIVED: HB A1C (BAYER DCA - WAIVED): 7.7 % — ABNORMAL HIGH (ref 4.8–5.6)

## 2024-09-25 MED ORDER — AZELASTINE HCL 0.1 % NA SOLN: 2.0000 | Freq: Two times a day (BID) | NASAL | 5 refills | Status: AC

## 2024-09-25 NOTE — Progress Notes (Signed)
 Subjective:  Patient ID: Peter Mathis, male    DOB: December 10, 1951  Age: 73 y.o. MRN: [id number]  CC: Medical Management of Chronic Issues   HPI  Discussed the use of AI scribe software for clinical note transcription with the patient, who gave verbal consent to proceed.  History of Present Illness Peter Mathis is a 73 year old male with diabetes and plantar fasciitis who presents for follow-up of his diabetes management and plantar fasciitis symptoms.  He is here for a follow-up regarding his diabetes management. His recent A1c level is 7.7, which has decreased from previous levels, but he aims to reach 7.0. He is currently taking metformin  and glimepiride , with the latter being a 4 mg tablet taken before breakfast daily. His blood sugar readings have improved over the past months.  He has ongoing issues with plantar fasciitis, causing heel pain that 'comes and goes'. Anti-inflammatory medications provide relief but do not completely resolve the issue. Stretching exercises and resting the foot are important for managing symptoms. He has ordered shoes with arch support, which seem to help, although he only wears them when going out to avoid getting them dirty.  He experiences seasonal allergies, particularly since the fall started, and has been using Flonase . He recalls a previous prescription of azelastine , which he found effective, and is interested in refilling this prescription.  No chest pain or shortness of breath reported.          03/16/2024    8:57 AM 11/23/2023    2:30 PM 11/16/2023    8:12 AM  Depression screen PHQ 2/9  Decreased Interest 0 0 0  Down, Depressed, Hopeless 0 0 0  PHQ - 2 Score 0 0 0  Altered sleeping 0    Tired, decreased energy 0    Change in appetite 0    Feeling bad or failure about yourself  0    Trouble concentrating 0    Moving slowly or fidgety/restless 0    Suicidal thoughts 0    PHQ-9 Score 0    Difficult doing work/chores Not  difficult at all      History Barret has a past medical history of Chest discomfort (1989), Diverticulosis, DM type 2 (diabetes mellitus, type 2) (HCC), Heart murmur, History of kidney stones, and Hyperlipidemia.   He has a past surgical history that includes Tonsillectomy and adenoidectomy (01/1955); Ganglion cyst excision (Right); and Inguinal hernia repair (Left, 04/03/2020).   His family history includes Diabetes in his mother; Heart attack in his father.He reports that he has never smoked. He has never used smokeless tobacco. He reports that he does not drink alcohol and does not use drugs.    ROS Review of Systems  Constitutional: Negative.   HENT: Negative.    Eyes:  Negative for visual disturbance.  Respiratory:  Negative for cough and shortness of breath.   Cardiovascular:  Negative for chest pain and leg swelling.  Gastrointestinal:  Negative for abdominal pain, diarrhea, nausea and vomiting.  Genitourinary:  Negative for difficulty urinating.  Musculoskeletal:  Negative for arthralgias and myalgias.  Skin:  Negative for rash.  Neurological:  Negative for headaches.  Psychiatric/Behavioral:  Negative for sleep disturbance.     Objective:  BP (!) 134/56   Pulse 70   Temp 98.2 F (36.8 C)   Ht 5' 11 (1.803 m)   Wt 170 lb 9.6 oz (77.4 kg)   SpO2 98%   BMI 23.79 kg/m   BP Readings from Last 3  Encounters:  09/25/24 (!) 134/56  06/19/24 (!) 142/69  03/16/24 129/62    Wt Readings from Last 3 Encounters:  09/25/24 170 lb 9.6 oz (77.4 kg)  06/19/24 170 lb (77.1 kg)  03/16/24 172 lb (78 kg)     Physical Exam Physical Exam GENERAL: Alert, cooperative, well developed, no acute distress. HEENT: Normocephalic, normal oropharynx, moist mucous membranes. Nasal passages slightly puffy. CHEST: Clear to auscultation bilaterally, no wheezes, rhonchi, or crackles. CARDIOVASCULAR: Normal heart rate and rhythm, S1 and S2 normal without murmurs. ABDOMEN: Soft, non-tender,  non-distended, without organomegaly, normal bowel sounds. EXTREMITIES: No cyanosis or edema. NEUROLOGICAL: Cranial nerves grossly intact, moves all extremities without gross motor or sensory deficit.   Assessment & Plan:  Encounter for immunization -     Flu vaccine HIGH DOSE PF(Fluzone Trivalent)  Type 2 diabetes mellitus without complication, without long-term current use of insulin (HCC) -     Vitamin B12 -     CMP14+EGFR -     Bayer DCA Hb A1c Waived -     CBC with Differential/Platelet  Mixed hyperlipidemia -     Lipid panel  Prostate cancer screening -     PSA, total and free  Seasonal allergic rhinitis due to pollen -     Azelastine  HCl; Place 2 sprays into both nostrils 2 (two) times daily.  Dispense: 30 mL; Refill: 5    Assessment and Plan Assessment & Plan Type 2 diabetes mellitus   Type 2 diabetes mellitus shows recent improvement with HbA1c at 7.7%, aiming for 7.0%. Current medications are metformin  and glimepiride . Blood sugar readings indicate better control, but there is a risk of hypoglycemia with glimepiride  if control becomes too tight. Continue metformin  and glimepiride , monitor blood glucose levels regularly, and aim to reduce HbA1c to 7.0%. Consider medication adjustment if hypoglycemia occurs.  Plantar fasciitis   Intermittent plantar fasciitis symptoms persist, with anti-inflammatory medications providing partial relief. Emphasized the importance of stretching, rest, and proper footwear. Continue anti-inflammatory medications as needed, perform stretching exercises using a towel, and use heat therapy such as soaking in warm water with Epsom salts. Consider custom-made arch support inserts for shoes.  Allergic rhinitis   Allergic rhinitis symptoms have increased since the fall. Previous treatment with Flonase  and azelastine  nasal spray continues, with azelastine  proving effective. Prescribe azelastine  nasal spray with updated instructions: two sprays in each  nostril in the morning and evening. Continue Flonase  as previously prescribed and verify the prescription is sent to [facility].       Follow-up: Return in about 4 months (around 01/25/2025).  Butler Der, M.D.

## 2024-09-26 LAB — CBC WITH DIFFERENTIAL/PLATELET
Basophils Absolute: 0 x10E3/uL (ref 0.0–0.2)
Basos: 0 %
EOS (ABSOLUTE): 0.3 x10E3/uL (ref 0.0–0.4)
Eos: 3 %
Hematocrit: 47.5 % (ref 37.5–51.0)
Hemoglobin: 15.1 g/dL (ref 13.0–17.7)
Immature Grans (Abs): 0 x10E3/uL (ref 0.0–0.1)
Immature Granulocytes: 0 %
Lymphocytes Absolute: 2.3 x10E3/uL (ref 0.7–3.1)
Lymphs: 27 %
MCH: 28.3 pg (ref 26.6–33.0)
MCHC: 31.8 g/dL (ref 31.5–35.7)
MCV: 89 fL (ref 79–97)
Monocytes Absolute: 0.7 x10E3/uL (ref 0.1–0.9)
Monocytes: 8 %
Neutrophils Absolute: 5.2 x10E3/uL (ref 1.4–7.0)
Neutrophils: 62 %
Platelets: 301 x10E3/uL (ref 150–450)
RBC: 5.34 x10E6/uL (ref 4.14–5.80)
RDW: 13 % (ref 11.6–15.4)
WBC: 8.5 x10E3/uL (ref 3.4–10.8)

## 2024-09-26 LAB — CMP14+EGFR
ALT: 25 IU/L (ref 0–44)
AST: 18 IU/L (ref 0–40)
Albumin: 4.1 g/dL (ref 3.8–4.8)
Alkaline Phosphatase: 58 IU/L (ref 47–123)
BUN/Creatinine Ratio: 17 (ref 10–24)
BUN: 17 mg/dL (ref 8–27)
Bilirubin Total: 0.5 mg/dL (ref 0.0–1.2)
CO2: 22 mmol/L (ref 20–29)
Calcium: 10 mg/dL (ref 8.6–10.2)
Chloride: 102 mmol/L (ref 96–106)
Creatinine, Ser: 1 mg/dL (ref 0.76–1.27)
Globulin, Total: 2.6 g/dL (ref 1.5–4.5)
Glucose: 133 mg/dL — ABNORMAL HIGH (ref 70–99)
Potassium: 5.2 mmol/L (ref 3.5–5.2)
Sodium: 137 mmol/L (ref 134–144)
Total Protein: 6.7 g/dL (ref 6.0–8.5)
eGFR: 79 mL/min/1.73 (ref >59)

## 2024-09-26 LAB — LIPID PANEL
Chol/HDL Ratio: 2.7 ratio (ref 0.0–5.0)
Cholesterol, Total: 96 mg/dL — ABNORMAL LOW (ref 100–199)
HDL: 36 mg/dL — ABNORMAL LOW (ref >39)
LDL Chol Calc (NIH): 42 mg/dL (ref 0–99)
Triglycerides: 92 mg/dL (ref 0–149)
VLDL Cholesterol Cal: 18 mg/dL (ref 5–40)

## 2024-09-26 LAB — PSA, TOTAL AND FREE
PSA, Free Pct: 22.3 %
PSA, Free: 0.58 ng/mL
Prostate Specific Ag, Serum: 2.6 ng/mL (ref 0.0–4.0)

## 2024-09-26 LAB — VITAMIN B12: Vitamin B-12: 181 pg/mL — AB (ref 232–1245)

## 2024-09-26 NOTE — Progress Notes (Signed)
Vitamin B12 is low. Needs to get injections, one ml weekly for a month then one ml monthly.

## 2024-09-28 ENCOUNTER — Other Ambulatory Visit: Payer: Self-pay | Admitting: *Deleted

## 2024-09-28 DIAGNOSIS — E538 Deficiency of other specified B group vitamins: Secondary | ICD-10-CM

## 2024-09-28 MED ORDER — CYANOCOBALAMIN 1000 MCG/ML IJ SOLN
1000.0000 ug | INTRAMUSCULAR | Status: AC
  Administered 2024-09-29 – 2024-10-20 (×4): 1000 ug via INTRAMUSCULAR

## 2024-09-28 MED ORDER — CYANOCOBALAMIN 1000 MCG/ML IJ SOLN: 1000.0000 ug | INTRAMUSCULAR | Status: DC

## 2024-09-28 NOTE — Addendum Note (Signed)
 Addended by: MILAS KENT D on: 09/28/2024 04:10 PM   Modules accepted: Orders

## 2024-09-29 ENCOUNTER — Ambulatory Visit (INDEPENDENT_AMBULATORY_CARE_PROVIDER_SITE_OTHER): Admitting: *Deleted

## 2024-09-29 DIAGNOSIS — E538 Deficiency of other specified B group vitamins: Secondary | ICD-10-CM

## 2024-09-29 NOTE — Progress Notes (Signed)
 Patient is in office today for a nurse visit for B12 Injection. Patient Injection was given in the  Right deltoid. Patient tolerated injection well.

## 2024-10-06 ENCOUNTER — Ambulatory Visit (INDEPENDENT_AMBULATORY_CARE_PROVIDER_SITE_OTHER)

## 2024-10-06 DIAGNOSIS — E538 Deficiency of other specified B group vitamins: Secondary | ICD-10-CM | POA: Diagnosis not present

## 2024-10-06 NOTE — Progress Notes (Signed)
 Patient presented to office today for a B-12 injection. Given in left arm and patient tolerated well

## 2024-10-12 ENCOUNTER — Ambulatory Visit

## 2024-10-13 ENCOUNTER — Ambulatory Visit: Admitting: *Deleted

## 2024-10-13 DIAGNOSIS — E538 Deficiency of other specified B group vitamins: Secondary | ICD-10-CM

## 2024-10-13 NOTE — Progress Notes (Signed)
 Patient is in office today for a nurse visit for B12 Injection. Patient Injection was given in the  Right deltoid. Patient tolerated injection well.

## 2024-10-20 ENCOUNTER — Ambulatory Visit

## 2024-10-20 DIAGNOSIS — E538 Deficiency of other specified B group vitamins: Secondary | ICD-10-CM

## 2024-10-20 NOTE — Progress Notes (Signed)
 Patient is in office today for a nurse visit for B12 Injection. Patient Injection was given in the  Left deltoid. Patient tolerated injection well.

## 2024-10-20 NOTE — Progress Notes (Deleted)
 hpi

## 2024-11-21 ENCOUNTER — Ambulatory Visit (INDEPENDENT_AMBULATORY_CARE_PROVIDER_SITE_OTHER): Payer: Self-pay | Admitting: *Deleted

## 2024-11-21 DIAGNOSIS — E538 Deficiency of other specified B group vitamins: Secondary | ICD-10-CM

## 2024-11-21 MED ORDER — CYANOCOBALAMIN 1000 MCG/ML IJ SOLN
1000.0000 ug | INTRAMUSCULAR | Status: AC
  Administered 2024-11-21 – 2025-02-02 (×3): 1000 ug via INTRAMUSCULAR

## 2024-11-21 NOTE — Progress Notes (Signed)
 Patient is in office today for a nurse visit for B12 Injection. Patient Injection was given in the  Right deltoid. Patient tolerated injection well.

## 2024-12-19 ENCOUNTER — Ambulatory Visit

## 2025-01-02 ENCOUNTER — Ambulatory Visit: Admitting: Family Medicine

## 2025-01-02 DIAGNOSIS — E538 Deficiency of other specified B group vitamins: Secondary | ICD-10-CM

## 2025-01-02 NOTE — Progress Notes (Signed)
 Patient came in today for B12 injection. Was given in right deltoid. Patient tolerated well. Next appt scheduled for 1 mth.

## 2025-01-16 ENCOUNTER — Emergency Department (HOSPITAL_COMMUNITY)

## 2025-01-16 ENCOUNTER — Emergency Department (HOSPITAL_COMMUNITY)
Admission: EM | Admit: 2025-01-16 | Discharge: 2025-01-16 | Disposition: A | Discharge status: Home / Self Care | Source: Ambulatory Visit | Attending: Emergency Medicine | Admitting: Emergency Medicine

## 2025-01-16 ENCOUNTER — Other Ambulatory Visit: Payer: Self-pay

## 2025-01-16 ENCOUNTER — Encounter (HOSPITAL_COMMUNITY): Payer: Self-pay | Admitting: Emergency Medicine

## 2025-01-16 DIAGNOSIS — R0789 Other chest pain: Secondary | ICD-10-CM | POA: Insufficient documentation

## 2025-01-16 DIAGNOSIS — Z7982 Long term (current) use of aspirin: Secondary | ICD-10-CM | POA: Insufficient documentation

## 2025-01-16 DIAGNOSIS — Z7984 Long term (current) use of oral hypoglycemic drugs: Secondary | ICD-10-CM | POA: Diagnosis not present

## 2025-01-16 DIAGNOSIS — E871 Hypo-osmolality and hyponatremia: Secondary | ICD-10-CM | POA: Insufficient documentation

## 2025-01-16 DIAGNOSIS — E119 Type 2 diabetes mellitus without complications: Secondary | ICD-10-CM | POA: Insufficient documentation

## 2025-01-16 LAB — BASIC METABOLIC PANEL WITH GFR
Anion gap: 13 (ref 5–15)
BUN: 21 mg/dL (ref 8–23)
CO2: 22 mmol/L (ref 22–32)
Calcium: 9.4 mg/dL (ref 8.9–10.3)
Chloride: 99 mmol/L (ref 98–111)
Creatinine, Ser: 0.94 mg/dL (ref 0.61–1.24)
GFR, Estimated: >60 mL/min
Glucose, Bld: 185 mg/dL — ABNORMAL HIGH (ref 70–99)
Potassium: 5.1 mmol/L (ref 3.5–5.1)
Sodium: 134 mmol/L — ABNORMAL LOW (ref 135–145)

## 2025-01-16 LAB — CBC
HCT: 45.8 % (ref 39.0–52.0)
Hemoglobin: 15.2 g/dL (ref 13.0–17.0)
MCH: 28.1 pg (ref 26.0–34.0)
MCHC: 33.2 g/dL (ref 30.0–36.0)
MCV: 84.7 fL (ref 80.0–100.0)
Platelets: 305 K/uL (ref 150–400)
RBC: 5.41 MIL/uL (ref 4.22–5.81)
RDW: 13.4 % (ref 11.5–15.5)
WBC: 8.5 K/uL (ref 4.0–10.5)
nRBC: 0 % (ref 0.0–0.2)

## 2025-01-16 LAB — TROPONIN T, HIGH SENSITIVITY
Troponin T High Sensitivity: 12 ng/L (ref 0–19)
Troponin T High Sensitivity: 9 ng/L (ref 0–19)

## 2025-01-16 MED ORDER — ASPIRIN 81 MG PO CHEW
324.0000 mg | CHEWABLE_TABLET | Freq: Once | ORAL | Status: AC
  Administered 2025-01-16: 324 mg via ORAL
  Filled 2025-01-16: qty 4

## 2025-01-16 NOTE — Discharge Instructions (Signed)
 If you develop recurrent, continued, or worsening chest pain, shortness of breath, fever, vomiting, abdominal or back pain, or any other new/concerning symptoms then return to the ER for evaluation.
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
## 2025-01-16 NOTE — ED Triage Notes (Signed)
 Patient come in POV from urgent care for chest pain and 1st degree Bundle block.

## 2025-01-16 NOTE — ED Triage Notes (Signed)
 Pt states he woke up this monring with chest pain. Denies any hx. Denies any other symptoms.

## 2025-01-17 ENCOUNTER — Other Ambulatory Visit: Payer: Self-pay | Admitting: Family Medicine

## 2025-01-29 ENCOUNTER — Ambulatory Visit: Payer: Self-pay | Admitting: Family Medicine

## 2025-02-02 ENCOUNTER — Ambulatory Visit (INDEPENDENT_AMBULATORY_CARE_PROVIDER_SITE_OTHER)

## 2025-02-02 DIAGNOSIS — E538 Deficiency of other specified B group vitamins: Secondary | ICD-10-CM

## 2025-02-02 NOTE — Progress Notes (Signed)
 Patient is in office today for a nurse visit for B12 Injection. Patient Injection was given in the  Left deltoid. Patient tolerated injection well.

## 2025-02-13 ENCOUNTER — Ambulatory Visit

## 2025-02-15 ENCOUNTER — Ambulatory Visit: Admitting: Family Medicine

## 2025-03-05 ENCOUNTER — Ambulatory Visit
